# Patient Record
Sex: Female | Born: 1965 | Race: Black or African American | Hispanic: No | Marital: Married | Smoking: Never smoker
Health system: Southern US, Community
[De-identification: ages and names within clinical notes are randomized; demographics above are authoritative.]

## PROBLEM LIST (undated history)

## (undated) DIAGNOSIS — F329 Major depressive disorder, single episode, unspecified: Secondary | ICD-10-CM

## (undated) DIAGNOSIS — G4730 Sleep apnea, unspecified: Secondary | ICD-10-CM

## (undated) DIAGNOSIS — K219 Gastro-esophageal reflux disease without esophagitis: Secondary | ICD-10-CM

## (undated) DIAGNOSIS — J45909 Unspecified asthma, uncomplicated: Secondary | ICD-10-CM

## (undated) DIAGNOSIS — M797 Fibromyalgia: Secondary | ICD-10-CM

## (undated) DIAGNOSIS — F32A Depression, unspecified: Secondary | ICD-10-CM

## (undated) DIAGNOSIS — F419 Anxiety disorder, unspecified: Secondary | ICD-10-CM

## (undated) HISTORY — DX: Major depressive disorder, single episode, unspecified: F32.9

## (undated) HISTORY — DX: Anxiety disorder, unspecified: F41.9

## (undated) HISTORY — PX: TUBAL LIGATION: SHX77

## (undated) HISTORY — PX: OTHER SURGICAL HISTORY: SHX169

## (undated) HISTORY — DX: Depression, unspecified: F32.A

## (undated) HISTORY — DX: Gastro-esophageal reflux disease without esophagitis: K21.9

## (undated) HISTORY — DX: Fibromyalgia: M79.7

## (undated) HISTORY — PX: MOUTH SURGERY: SHX715

---

## 2001-03-11 ENCOUNTER — Emergency Department (HOSPITAL_COMMUNITY): Admission: EM | Admit: 2001-03-11 | Discharge: 2001-03-11 | Payer: Self-pay | Admitting: Emergency Medicine

## 2001-03-11 ENCOUNTER — Encounter: Payer: Self-pay | Admitting: Emergency Medicine

## 2001-03-13 ENCOUNTER — Emergency Department (HOSPITAL_COMMUNITY): Admission: EM | Admit: 2001-03-13 | Discharge: 2001-03-13 | Payer: Self-pay | Admitting: *Deleted

## 2001-04-02 ENCOUNTER — Emergency Department (HOSPITAL_COMMUNITY): Admission: EM | Admit: 2001-04-02 | Discharge: 2001-04-02 | Payer: Self-pay | Admitting: Emergency Medicine

## 2001-10-01 ENCOUNTER — Emergency Department (HOSPITAL_COMMUNITY): Admission: EM | Admit: 2001-10-01 | Discharge: 2001-10-01 | Payer: Self-pay | Admitting: Emergency Medicine

## 2001-10-01 ENCOUNTER — Encounter: Payer: Self-pay | Admitting: Emergency Medicine

## 2002-04-21 ENCOUNTER — Emergency Department (HOSPITAL_COMMUNITY): Admission: EM | Admit: 2002-04-21 | Discharge: 2002-04-21 | Payer: Self-pay | Admitting: Internal Medicine

## 2002-06-03 ENCOUNTER — Emergency Department (HOSPITAL_COMMUNITY): Admission: EM | Admit: 2002-06-03 | Discharge: 2002-06-03 | Payer: Self-pay | Admitting: Internal Medicine

## 2003-09-20 ENCOUNTER — Ambulatory Visit (HOSPITAL_COMMUNITY): Admission: RE | Admit: 2003-09-20 | Discharge: 2003-09-20 | Payer: Self-pay | Admitting: Oral Surgery

## 2003-09-20 ENCOUNTER — Ambulatory Visit (HOSPITAL_BASED_OUTPATIENT_CLINIC_OR_DEPARTMENT_OTHER): Admission: RE | Admit: 2003-09-20 | Discharge: 2003-09-20 | Payer: Self-pay | Admitting: Oral Surgery

## 2007-07-25 ENCOUNTER — Ambulatory Visit: Payer: Self-pay | Admitting: Cardiology

## 2008-05-17 ENCOUNTER — Emergency Department (HOSPITAL_COMMUNITY): Admission: EM | Admit: 2008-05-17 | Discharge: 2008-05-17 | Payer: Self-pay | Admitting: Emergency Medicine

## 2009-12-10 ENCOUNTER — Ambulatory Visit: Payer: Self-pay | Admitting: Cardiology

## 2010-08-19 LAB — CBC
HCT: 36.7 % (ref 36.0–46.0)
Hemoglobin: 12.4 g/dL (ref 12.0–15.0)
MCHC: 33.8 g/dL (ref 30.0–36.0)
MCV: 95.9 fL (ref 78.0–100.0)
Platelets: 169 10*3/uL (ref 150–400)
RBC: 3.82 MIL/uL — ABNORMAL LOW (ref 3.87–5.11)
RDW: 12.7 % (ref 11.5–15.5)
WBC: 6.1 10*3/uL (ref 4.0–10.5)

## 2010-08-19 LAB — URINALYSIS, ROUTINE W REFLEX MICROSCOPIC
Nitrite: NEGATIVE
Specific Gravity, Urine: >1.03 — ABNORMAL HIGH (ref 1.005–1.030)
Urobilinogen, UA: 0.2 mg/dL (ref 0.0–1.0)

## 2010-08-19 LAB — COMPREHENSIVE METABOLIC PANEL
ALT: 16 U/L (ref 0–35)
AST: 20 U/L (ref 0–37)
Albumin: 3.3 g/dL — ABNORMAL LOW (ref 3.5–5.2)
Alkaline Phosphatase: 56 U/L (ref 39–117)
BUN: 6 mg/dL (ref 6–23)
CO2: 29 mEq/L (ref 19–32)
Calcium: 9.3 mg/dL (ref 8.4–10.5)
Chloride: 104 mEq/L (ref 96–112)
Creatinine, Ser: 0.86 mg/dL (ref 0.4–1.2)
GFR calc Af Amer: >60 mL/min (ref >60)
GFR calc non Af Amer: >60 mL/min (ref >60)
Glucose, Bld: 122 mg/dL — ABNORMAL HIGH (ref 70–99)
Potassium: 4.3 mEq/L (ref 3.5–5.1)
Sodium: 139 mEq/L (ref 135–145)
Total Bilirubin: 0.4 mg/dL (ref 0.3–1.2)
Total Protein: 7.1 g/dL (ref 6.0–8.3)

## 2010-08-19 LAB — DIFFERENTIAL
Basophils Absolute: 0 10*3/uL (ref 0.0–0.1)
Lymphocytes Relative: 28 % (ref 12–46)
Neutro Abs: 4 10*3/uL (ref 1.7–7.7)
Neutrophils Relative %: 65 % (ref 43–77)

## 2010-08-19 LAB — PREGNANCY, URINE: Preg Test, Ur: NEGATIVE

## 2010-08-19 LAB — URINE MICROSCOPIC-ADD ON

## 2010-08-19 LAB — GLUCOSE, CAPILLARY: Glucose-Capillary: 113 mg/dL — ABNORMAL HIGH (ref 70–99)

## 2010-08-26 ENCOUNTER — Ambulatory Visit (HOSPITAL_COMMUNITY)
Admission: RE | Admit: 2010-08-26 | Discharge: 2010-08-26 | Disposition: A | Discharge status: Home / Self Care | Payer: BC Managed Care – PPO | Source: Ambulatory Visit | Attending: Family Medicine | Admitting: Family Medicine

## 2010-08-26 ENCOUNTER — Other Ambulatory Visit (HOSPITAL_COMMUNITY): Payer: Self-pay | Admitting: Family Medicine

## 2010-08-26 ENCOUNTER — Encounter (HOSPITAL_COMMUNITY): Payer: Self-pay

## 2010-08-26 DIAGNOSIS — M25519 Pain in unspecified shoulder: Secondary | ICD-10-CM | POA: Insufficient documentation

## 2010-08-26 DIAGNOSIS — IMO0002 Reserved for concepts with insufficient information to code with codable children: Secondary | ICD-10-CM

## 2010-09-20 NOTE — Op Note (Signed)
NAME:  JUMPER, Freda H                          ACCOUNT NO.:  [id number]   MEDICAL RECORD NO.:  [id number]                   PATIENT TYPE:  AMB   LOCATION:  DSC                                  FACILITY:  [facility]   PHYSICIAN:  Hewitt Blade, D.D.S.             DATE OF BIRTH:  [birth year]   DATE OF PROCEDURE:  09/20/2003  DATE OF DISCHARGE:                                 OPERATIVE REPORT   PREOPERATIVE DIAGNOSES:  1. Maxillary and mandibular nonrestorable teeth #1 through 32.  2. Bilateral maxillary hyperplastic tuberosities.   POSTOPERATIVE DIAGNOSES:  1. Maxillary and mandibular nonrestorable teeth #1 through 32.  2. Bilateral maxillary hyperplastic tuberosities.   SURGERY PERFORMED:  1. Reduction of right and left maxillary posterior tuberosities.  2. Removal of chronically infected and decayed teeth #1 through 32.   SURGEON:  Hewitt Blade, D.D.S.   FIRST ASSISTANT:  Earlene Plater.   ANESTHESIA:  General via oroendotracheal intubation.   ESTIMATED BLOOD LOSS:  Approximately 100 mL.   FLUIDS REPLACED:  Approximately 1500 mL crystalloid solution.   COMPLICATIONS:  None apparent.   INDICATION FOR PROCEDURE:  Ms. Virginia Trujillo is a 45 year old female who presented  to my office for removal of her chronically decayed and painful teeth.  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  She suffered from years of chronic dental neglect.  She presented  due to the chronic infection and irrigation, bilateral posterior  hyperplastic tuberosities which were in a difficult area to prevent removal  without airway obstruction.  For this reason the procedure was performed at  [facility] day surgery under general anesthesia to maintain the airway.   DETAILS OF THE PROCEDURE:  On Sep 20, 2003, Ms. Virginia Trujillo was taken to [facility] day surgical center, she was placed on the operating room table in  a supine position.  Following successful oroendotracheal  intubation and  general anesthesia, the patient's face, neck, and oral cavity were prepped  and draped in the usual sterile operating room fashion.  Attention was then  directed intraorally, where approximately 14 mL of 0.5% Xylocaine containing  1:200,000 epinephrine were infiltrated in the maxillary, buccal, and lingual  soft tissues and the right and left inferior alveolar nerve distributions  and the anterior buccal vestibular tissues.  A #9 Molt periosteal elevator  was then used to reflect a full-thickness mucoperiosteal flap around the  buccal and lingual surfaces of the dentition.  The teeth were then  subluxated from the alveolus using an 11A elevator and removed from the oral  cavity and the maxillary arch using a 150 dental forceps and in the  mandibular arch using a 151 dental forceps.  The bony margins were then  smoothed and contoured using the Stryker rotary osteotome with copious  irrigation.  Attention was then directed toward the tuberosities, where a  #15 Bard Parker blade was used to create a  full-thickness muco-osseous  incision through the hyperplastic tuberosities bilaterally.  The incision  was made approximately 1.5 cm along the buccal aspect of the tuberosities  and the palatal aspect of the tuberosities.   A #9 Molt periosteal elevator was then used to reflect the periosteum off of  the bony tuberosities. The soft tissue hyperplasia was then reduced using  the rongeurs and cutting forceps.  The bony tuberosities were then reduced  using a Stryker rotary osteotome and a #8 round bur with thorough and  copious irrigation of sterile saline solution.   The surgical sites were then thoroughly irrigated with sterile saline  irrigating solution and suctioned.  The mucoperiosteal margin was then  approximated and sutured in a continuous interlocking fashion using 4-0  chromic suture material.  The oral cavity was then again thoroughly  irrigated and suctioned using the  Yankauer suction.  The throat pack was  removed and the hypopharynx suctioned free of fluids and secretions.  The  patient was then allowed to awaken from the anesthesia and taken to the  recovery room, where she tolerated the procedure well without apparent  complication.                                               Hewitt Blade, D.D.S.    DC/MEDQ  D:  09/20/2003  T:  09/21/2003  Job:  [id number]

## 2010-10-01 ENCOUNTER — Ambulatory Visit: Payer: BC Managed Care – PPO | Admitting: Orthopedic Surgery

## 2010-10-03 ENCOUNTER — Other Ambulatory Visit (HOSPITAL_COMMUNITY): Payer: Self-pay | Admitting: Internal Medicine

## 2010-10-03 DIAGNOSIS — R609 Edema, unspecified: Secondary | ICD-10-CM

## 2010-10-04 ENCOUNTER — Ambulatory Visit (HOSPITAL_COMMUNITY)
Admission: RE | Admit: 2010-10-04 | Discharge: 2010-10-04 | Disposition: A | Discharge status: Home / Self Care | Payer: BC Managed Care – PPO | Source: Ambulatory Visit | Attending: Internal Medicine | Admitting: Internal Medicine

## 2010-10-04 DIAGNOSIS — R609 Edema, unspecified: Secondary | ICD-10-CM

## 2010-10-04 DIAGNOSIS — M79609 Pain in unspecified limb: Secondary | ICD-10-CM | POA: Insufficient documentation

## 2010-10-04 DIAGNOSIS — M7989 Other specified soft tissue disorders: Secondary | ICD-10-CM | POA: Insufficient documentation

## 2010-10-16 ENCOUNTER — Encounter: Payer: Self-pay | Admitting: Orthopedic Surgery

## 2010-10-16 ENCOUNTER — Ambulatory Visit (INDEPENDENT_AMBULATORY_CARE_PROVIDER_SITE_OTHER): Payer: BC Managed Care – PPO | Admitting: Orthopedic Surgery

## 2010-10-16 VITALS — Ht 64.0 in | Wt 239.0 lb

## 2010-10-16 DIAGNOSIS — M75101 Unspecified rotator cuff tear or rupture of right shoulder, not specified as traumatic: Secondary | ICD-10-CM | POA: Insufficient documentation

## 2010-10-16 DIAGNOSIS — M67919 Unspecified disorder of synovium and tendon, unspecified shoulder: Secondary | ICD-10-CM

## 2010-10-16 MED ORDER — HYDROCODONE-ACETAMINOPHEN 5-325 MG PO TABS: 1.0000 | ORAL_TABLET | Freq: Four times a day (QID) | ORAL | Status: AC | PRN

## 2010-10-16 MED ORDER — METHYLPREDNISOLONE ACETATE 40 MG/ML IJ SUSP: 40.0000 mg | Freq: Once | INTRAMUSCULAR | Status: DC

## 2010-10-16 NOTE — Progress Notes (Signed)
   This is a new patient  Referred from [facility]  Chief complaint pain LEFT arm for 6 months  The pain started suddenly without injury.  The patient is experiencing sharp dull throbbing stabbing burning pain over the LEFT shoulder.  The pain has an intensity of 8/10.  Symptoms are intermittent.  The patient has difficulty elevating the arm above shoulder height.  She reports some mild tingling and swelling in the LEFT arm.  Pain increases at night as well.  Positive review of systems include revision of fainting heart murmur shortness of breath heartburn muscle pain dizziness and seasonal ALLERGIES no weight loss chest pain frequency skin changes nervousness or anxiety excessive thirst or easy bleed bleeding or bruising  The history is recorded and reviewed as above  Vital signs are stable as recorded  General appearance is normal  The patient is alert and oriented x3  The patient's mood and affect are normal  The patient is ambulating with a normal gait pattern  The cardiovascular exam reveals normal pulses and temperature without edema swelling.  The lymphatic system is negative for palpable lymph nodes  The sensory exam is normal.  There are no pathologic reflexes.  Balance is normal.  Body area: RIGHT shoulder no swelling no tenderness full range of motion.  All ligaments are stable strength is normal skin is intact  LEFT shoulder inspection reveals tenderness around the peri-acromial region.  Range of motion flexion 130 external rotation 45 internal rotation back pocket.  Supraspinatus tendon grade 5 strength.  Internal/external rotation strength normal.  The shoulder is stable in abduction external rotation as well as inferiorly.  There is a positive impingement sign at 120.  Impression X-rays were done at the [facility] facility and they were normal included 2 views LEFT shoulder.  Rotator cuff syndrome most likely bursitis  Treatment Subacromial  injection, Norco 5 mg for pain.  Physical therapy.  No followup scheduled

## 2010-10-16 NOTE — Patient Instructions (Signed)
.  inj

## 2010-10-16 NOTE — Progress Notes (Signed)
LEFT shoulder subacromial space Injection LEFT knee.  Consent was obtained.  Time out was taken   LEFT Shoulderwas injected with Depo-Medrol 40 mg plus lidocaine 1% 4 cc.  Shoulder was prepped with alcohol and anesthetized with ethyl chloride.  The injection was tolerated without complication.   

## 2011-07-02 ENCOUNTER — Other Ambulatory Visit (HOSPITAL_COMMUNITY): Payer: Self-pay | Admitting: Internal Medicine

## 2011-07-02 ENCOUNTER — Ambulatory Visit (HOSPITAL_COMMUNITY)
Admission: RE | Admit: 2011-07-02 | Discharge: 2011-07-02 | Disposition: A | Discharge status: Home / Self Care | Payer: BC Managed Care – PPO | Source: Ambulatory Visit | Attending: Internal Medicine | Admitting: Internal Medicine

## 2011-07-02 DIAGNOSIS — M7989 Other specified soft tissue disorders: Secondary | ICD-10-CM | POA: Insufficient documentation

## 2011-07-02 DIAGNOSIS — M79609 Pain in unspecified limb: Secondary | ICD-10-CM | POA: Insufficient documentation

## 2012-10-19 ENCOUNTER — Other Ambulatory Visit: Payer: Self-pay

## 2012-10-19 DIAGNOSIS — G4730 Sleep apnea, unspecified: Secondary | ICD-10-CM

## 2012-10-26 ENCOUNTER — Ambulatory Visit: Payer: BC Managed Care – PPO | Attending: Neurology | Admitting: Sleep Medicine

## 2012-10-26 VITALS — Ht 63.0 in | Wt 253.0 lb

## 2012-10-26 DIAGNOSIS — G4733 Obstructive sleep apnea (adult) (pediatric): Secondary | ICD-10-CM | POA: Insufficient documentation

## 2012-10-26 DIAGNOSIS — G4730 Sleep apnea, unspecified: Secondary | ICD-10-CM

## 2012-10-29 NOTE — Procedures (Signed)
[facility] Rohn Fritsch A. Gerilyn Pilgrim, MD     [email or web address]        NAME:  Virginia Trujillo, Virginia Trujillo                ACCOUNT NO.:  [id number]  MEDICAL RECORD NO.:  [id number]          PATIENT TYPE:  OUT  LOCATION:  SLEEP LAB                     FACILITY:  [facility]  PHYSICIAN:  Adalina Dopson A. Gerilyn Pilgrim, M.D. DATE OF BIRTH:  1966-03-29  DATE OF STUDY:  10/26/2012                           NOCTURNAL POLYSOMNOGRAM  REFERRING PHYSICIAN:  Jabez Molner A. Gerilyn Pilgrim, M.D.  INDICATIONS:  A 47 year old, who presents with hypersomnia, fatigue, and snoring.  MEDICATIONS:  Omeprazole, tizanidine, meloxicam, diazepam, citalopram, and omeprazole.  EPWORTH SLEEPINESS SCALE:  15.  BMI:  25.  ARCHITECTURAL SUMMARY:  The total recording time is 365 minutes.  Sleep efficiency 88%, sleep latency 2 minutes.  REM latency calculated by the computer as 28 minutes.  The recalculated at 233 minutes.  Stage N1 5%, N2 70%, N3 30% and REM sleep 10%.  RESPIRATORY SUMMARY:  Baseline oxygen saturation is 98, lowest saturation 72 during REM sleep.  Diagnostic AHI is 19 and RDI 20.  LIMB MOVEMENT SUMMARY:  PLM index 0.  ELECTROCARDIOGRAM SUMMARY:  Average heart rate is 66 with no significant dysrhythmias observed.  IMPRESSION:  Moderate obstructive sleep apnea syndrome.  RECOMMENDATION:  Formal CPAP titration.    Jehiel Koepp A. Gerilyn Pilgrim, M.D.    KAD/MEDQ  D:  10/29/2012 10:11:16  T:  10/29/2012 10:25:45  Job:  [id number]

## 2012-12-06 ENCOUNTER — Ambulatory Visit (HOSPITAL_COMMUNITY)
Admission: RE | Admit: 2012-12-06 | Discharge: 2012-12-06 | Disposition: A | Discharge status: Home / Self Care | Payer: BC Managed Care – PPO | Source: Ambulatory Visit | Attending: Family Medicine | Admitting: Family Medicine

## 2012-12-06 ENCOUNTER — Other Ambulatory Visit (HOSPITAL_COMMUNITY): Payer: Self-pay | Admitting: Family Medicine

## 2012-12-06 DIAGNOSIS — R0602 Shortness of breath: Secondary | ICD-10-CM | POA: Insufficient documentation

## 2012-12-06 DIAGNOSIS — R079 Chest pain, unspecified: Secondary | ICD-10-CM | POA: Insufficient documentation

## 2012-12-06 DIAGNOSIS — R062 Wheezing: Secondary | ICD-10-CM | POA: Insufficient documentation

## 2012-12-16 ENCOUNTER — Other Ambulatory Visit: Payer: Self-pay

## 2012-12-16 DIAGNOSIS — G4730 Sleep apnea, unspecified: Secondary | ICD-10-CM

## 2012-12-16 DIAGNOSIS — R0683 Snoring: Secondary | ICD-10-CM

## 2012-12-21 ENCOUNTER — Other Ambulatory Visit: Payer: Self-pay | Admitting: *Deleted

## 2012-12-21 DIAGNOSIS — R002 Palpitations: Secondary | ICD-10-CM

## 2012-12-21 DIAGNOSIS — R079 Chest pain, unspecified: Secondary | ICD-10-CM

## 2012-12-21 DIAGNOSIS — R011 Cardiac murmur, unspecified: Secondary | ICD-10-CM

## 2012-12-22 ENCOUNTER — Encounter (HOSPITAL_COMMUNITY): Payer: Self-pay | Admitting: Cardiovascular Disease

## 2013-01-06 ENCOUNTER — Telehealth: Payer: Self-pay | Admitting: Internal Medicine

## 2013-01-06 ENCOUNTER — Ambulatory Visit (HOSPITAL_COMMUNITY)
Admission: RE | Admit: 2013-01-06 | Discharge: 2013-01-06 | Disposition: A | Discharge status: Home / Self Care | Payer: BC Managed Care – PPO | Source: Ambulatory Visit | Attending: Cardiovascular Disease | Admitting: Cardiovascular Disease

## 2013-01-06 DIAGNOSIS — R42 Dizziness and giddiness: Secondary | ICD-10-CM | POA: Insufficient documentation

## 2013-01-06 DIAGNOSIS — R011 Cardiac murmur, unspecified: Secondary | ICD-10-CM

## 2013-01-06 DIAGNOSIS — R002 Palpitations: Secondary | ICD-10-CM

## 2013-01-06 DIAGNOSIS — R079 Chest pain, unspecified: Secondary | ICD-10-CM | POA: Insufficient documentation

## 2013-01-06 DIAGNOSIS — I10 Essential (primary) hypertension: Secondary | ICD-10-CM | POA: Insufficient documentation

## 2013-01-06 DIAGNOSIS — E669 Obesity, unspecified: Secondary | ICD-10-CM | POA: Insufficient documentation

## 2013-01-06 DIAGNOSIS — R5381 Other malaise: Secondary | ICD-10-CM | POA: Insufficient documentation

## 2013-01-06 MED ORDER — TECHNETIUM TC 99M SESTAMIBI GENERIC - CARDIOLITE
10.0000 | Freq: Once | INTRAVENOUS | Status: AC | PRN
  Administered 2013-01-06: 10 via INTRAVENOUS

## 2013-01-06 MED ORDER — AMINOPHYLLINE 25 MG/ML IV SOLN
75.0000 mg | Freq: Once | INTRAVENOUS | Status: AC
  Administered 2013-01-06: 75 mg via INTRAVENOUS

## 2013-01-06 MED ORDER — TECHNETIUM TC 99M SESTAMIBI GENERIC - CARDIOLITE
30.0000 | Freq: Once | INTRAVENOUS | Status: AC | PRN
  Administered 2013-01-06: 30 via INTRAVENOUS

## 2013-01-06 MED ORDER — REGADENOSON 0.4 MG/5ML IV SOLN
0.4000 mg | Freq: Once | INTRAVENOUS | Status: AC
  Administered 2013-01-06: 0.4 mg via INTRAVENOUS

## 2013-01-06 NOTE — Telephone Encounter (Signed)
PT CALLED TO SCHEDULED NP APPT 09/25 @ 10:30 W/DR. CHISM REFERRING DR. Corliss Skains DX- ELEVATED ESR WELCOME PACKET MAILED.

## 2013-01-06 NOTE — Telephone Encounter (Signed)
LVOM FOR PT TO RETURN CALL IN RE TO REFERRAL.  °

## 2013-01-06 NOTE — Progress Notes (Signed)
2D Echo Performed 01/06/2013    Clearence Ped, RCS

## 2013-01-06 NOTE — Telephone Encounter (Signed)
C/D 01/06/13 for appt. 01/27/13

## 2013-01-06 NOTE — Procedures (Addendum)
[facility] [address] [place] [address] [phone]  Cardiology Nuclear Med Study  Virginia Trujillo is a 47 y.o. female     MRN : [id number]     DOB: March 17, 1966  Procedure Date: 01/06/2013  Nuclear Med Background Indication for Stress Test:  Evaluation for Ischemia History:  MURMUR Cardiac Risk Factors: Family History - CAD, Hypertension, Lipids and Obesity  Symptoms:  Chest Pain, Dizziness, DOE, Fatigue, Light-Headedness, Palpitations and SOB   Nuclear Pre-Procedure Caffeine/Decaff Intake:  10:00pm NPO After: 8:00am   IV Site: R Forearm  IV 0.9% NS with Angio Cath:  22g  Chest Size (in):  N/A  IV Started by: Emmit Pomfret, RN  Height: 5\' 2"  (1.575 m)  Cup Size: DD  BMI:  Body mass index is 46.26 kg/(m^2). Weight:  253 lb (114.76 kg)   Tech Comments:  N/A    Nuclear Med Study 1 or 2 day study: 1 day  Stress Test Type:  Lexiscan  Order Authorizing Provider:  Hazeline Junker   Resting Radionuclide: Technetium 97m Sestamibi  Resting Radionuclide Dose: 10.5 mCi   Stress Radionuclide:  Technetium 25m Sestamibi  Stress Radionuclide Dose: 31.6 mCi           Stress Protocol Rest HR: 71 Stress HR: 99  Rest BP: 130/84 Stress BP:139/81  Exercise Time (min): n/a METS: n/a          Dose of Adenosine (mg):  n/a Dose of Lexiscan: 0.4 mg  Dose of Atropine (mg): n/a Dose of Dobutamine: n/a mcg/kg/min (at max HR)  Stress Test Technologist: Ernestene Mention, CCT Nuclear Technologist: Koren Shiver, CNMT   Rest Procedure:  Myocardial perfusion imaging was performed at rest 45 minutes following the intravenous administration of Technetium 42m Sestamibi. Stress Procedure:  The patient received IV Lexiscan 0.4 mg over 15-seconds.  Technetium 76m Sestamibi injected at 30-seconds.  Due to patient's shortness of breath and light-headedness, she was given IV Aminophylline 75 mg. Symptoms were resolved during recovery. There were  no significant changes with Lexiscan.  Quantitative spect images were obtained after a 45 minute delay.  Transient Ischemic Dilatation (Normal <1.22):  0.80 Lung/Heart Ratio (Normal <0.45):  0.26 QGS EDV:  55 ml QGS ESV:  13 ml LV Ejection Fraction: 77%  Rest ECG: NSR - Normal EKG  Stress ECG: No significant change from baseline ECG  QPS Raw Data Images:  Normal; no motion artifact; normal heart/lung ratio. Stress Images:  Normal homogeneous uptake in all areas of the myocardium. Rest Images:  Normal homogeneous uptake in all areas of the myocardium. Subtraction (SDS):  No evidence of ischemia.  Impression Exercise Capacity:  Lexiscan with no exercise. BP Response:  Normal blood pressure response. Clinical Symptoms:  No significant symptoms noted. ECG Impression:  No significant ECG changes with Lexiscan. Comparison with Prior Nuclear Study: No previous nuclear study performed  Overall Impression:  Normal stress nuclear study.  LV Wall Motion:  NL LV Function; NL Wall Motion; EF 78%.  Virginia Nose, MD, [facility] Board Certified in Nuclear Cardiology Attending Cardiologist [facility]  Virginia Nose, MD  01/06/2013 1:11 PM

## 2013-01-07 ENCOUNTER — Telehealth: Payer: Self-pay | Admitting: Internal Medicine

## 2013-01-07 NOTE — Telephone Encounter (Signed)
C/D 01/07/13 for appt. 01/27/13

## 2013-01-11 ENCOUNTER — Encounter: Payer: Self-pay | Admitting: Cardiovascular Disease

## 2013-01-13 ENCOUNTER — Ambulatory Visit: Payer: BC Managed Care – PPO | Attending: Neurology | Admitting: Sleep Medicine

## 2013-01-13 DIAGNOSIS — Z6841 Body Mass Index (BMI) 40.0 and over, adult: Secondary | ICD-10-CM | POA: Insufficient documentation

## 2013-01-13 DIAGNOSIS — G4733 Obstructive sleep apnea (adult) (pediatric): Secondary | ICD-10-CM | POA: Insufficient documentation

## 2013-01-13 DIAGNOSIS — R0683 Snoring: Secondary | ICD-10-CM

## 2013-01-13 DIAGNOSIS — G4730 Sleep apnea, unspecified: Secondary | ICD-10-CM

## 2013-01-15 NOTE — Procedures (Signed)
[facility] Berneice Zettlemoyer A. Gerilyn Pilgrim, MD     [email or web address]        NAME:  Virginia Trujillo, Virginia Trujillo                ACCOUNT NO.:  [id number]  MEDICAL RECORD NO.:  [id number]          PATIENT TYPE:  OUT  LOCATION:  SLEEP LAB                     FACILITY:  [facility]  PHYSICIAN:  Lochlin Eppinger A. Gerilyn Pilgrim, M.D. DATE OF BIRTH:  1965-11-27  DATE OF STUDY:  01/13/2013                           NOCTURNAL POLYSOMNOGRAM  REFERRING PHYSICIAN:  Lekia Nier A. Gerilyn Pilgrim, M.D.  INDICATION:  This is a 47 year old woman who presents with hypersomnia and snoring.  She has polysomnography, evidence of sleep apnea.  This is a CPAP titration recording.  MEDICATIONS:  Omeprazole, Zanaflex, meloxicam, diazepam, Lexapro, trazodone, gabapentin.  EPWORTH SLEEPINESS SCALE:  15.  BMI:  45.  ARCHITECTURAL SUMMARY:  The total recording time is 360 minutes, sleep efficiency is 75%.  Sleep latency 5 minutes.  REM latency 221 minute, stage N1 10%, N2 72%, N3 9%, and REM sleep 9%.  RESPIRATORY SUMMARY:  Baseline oxygen saturation is 98, lowest saturation 92 during non-REM sleep.  The patient has been treated with positive pressure starting at 40, increased 14.  The patient did well at pressures of 13 and 14 with resolution of obstructive events.  LIMB MOVEMENT SUMMARY:  PLM index 0.  ELECTROCARDIOGRAM SUMMARY:  Average heart rate is 58 with sinus arrhythmia observed.   IMPRESSION:  Obstructive sleep apnea syndrome, which responds well to a CPAP of 13 and 14. We recommended 13 the lowest effective pressure.  Thanks for this referral.    Njeri Vicente A. Gerilyn Pilgrim, M.D.    KAD/MEDQ  D:  01/15/2013 14:30:39  T:  01/15/2013 14:52:56  Job:  [id number]

## 2013-01-27 ENCOUNTER — Ambulatory Visit: Payer: BC Managed Care – PPO

## 2013-01-27 ENCOUNTER — Other Ambulatory Visit: Payer: Self-pay | Admitting: Internal Medicine

## 2013-01-27 DIAGNOSIS — G4733 Obstructive sleep apnea (adult) (pediatric): Secondary | ICD-10-CM

## 2013-10-11 ENCOUNTER — Other Ambulatory Visit: Payer: Self-pay | Admitting: *Deleted

## 2013-10-11 MED ORDER — OMEPRAZOLE 40 MG PO CPDR: 40.0000 mg | DELAYED_RELEASE_CAPSULE | Freq: Every day | ORAL | Status: DC

## 2013-10-11 NOTE — Telephone Encounter (Signed)
Rx refill sent to patient pharmacy   

## 2013-11-28 ENCOUNTER — Encounter: Payer: Self-pay | Admitting: Neurology

## 2013-11-29 ENCOUNTER — Encounter: Payer: Self-pay | Admitting: Neurology

## 2013-11-29 ENCOUNTER — Ambulatory Visit (INDEPENDENT_AMBULATORY_CARE_PROVIDER_SITE_OTHER): Payer: BC Managed Care – PPO | Admitting: Neurology

## 2013-11-29 VITALS — BP 140/74 | HR 78 | Ht 64.5 in | Wt 256.0 lb

## 2013-11-29 DIAGNOSIS — R51 Headache: Secondary | ICD-10-CM

## 2013-11-29 DIAGNOSIS — R4189 Other symptoms and signs involving cognitive functions and awareness: Secondary | ICD-10-CM

## 2013-11-29 DIAGNOSIS — F09 Unspecified mental disorder due to known physiological condition: Secondary | ICD-10-CM

## 2013-11-29 DIAGNOSIS — R519 Headache, unspecified: Secondary | ICD-10-CM | POA: Insufficient documentation

## 2013-11-29 NOTE — Progress Notes (Signed)
[facility]    Provider:  Dr Hosie PoissonSumner Referring Provider: Corrie MckusickGolding, John C, MD Primary Care Physician:  Colette RibasGOLDING, JOHN CABOT, MD  CC:  Headaches and cognitive decline  HPI:  Virginia Trujillo is a 48 y.o. female here as a referral from Dr. Phillips OdorGolding for headache and cognitive decline  Headaches started 1-2 months ago. Occuring 1-2 times a day, last a few minutes. Comes on quickly and then resolves quickly. Described as frontal and bitemporal, squeezing type pain. Gets up to a 8/10 in severity. No nausea or emesis. Notes some blurry vision, no loss of vision, no transient obscurations. Gets generalized weakness. Occurs randomly throughout the day. No known triggers. Notes cutting back on her caffeine intake, started cutting back 1-2 weeks ago. Currently taking tylenol for the headache, typically takes 2-4 tylenol 325mg  for the headache. Notes some sinus congestion and allergies but currently doing well. Wears a CPAP machine for her sleep. Started this around 4-6 months ago, continues to not sleep well. Wakes up feeling tired. Wears the mask.   Memory decline started few months ago. Has trouble with short term memory. Forgets why she went into a store, forgets what she was talking about, is missing. Notes some difficulty with remote memory. No head trauma. No EtOH or tobacco usage. Notes she has fibromyalgia and arthritis. Grandfather had AD.   Review of Systems: Out of a complete 14 system review, the patient complains of only the following symptoms, and all other reviewed systems are negative. + headache, fatigue, blurry vision  History   Social History  . Marital Status: Married    Spouse Name: N/A    Number of Children: 2  . Years of Education: 12   Occupational History  . none   . Unemployed     Social History Main Topics  . Smoking status: Never Smoker   . Smokeless tobacco: Never Used  . Alcohol Use: No  . Drug Use: No  . Sexual Activity: Not on file   Other  Topics Concern  . Not on file   Social History Narrative   Patient lives at home with husband and son.    Patient does not work   Patient has a high school education    Patient has 2 children.    Family History  Problem Relation Age of Onset  . Heart disease    . Arthritis    . Cancer    . Asthma    . Diabetes    . Kidney disease      Past Medical History  Diagnosis Date  . Acid reflux     Past Surgical History  Procedure Laterality Date  . Cesarean section    . Tubes tied    . Mouth surgery      Current Outpatient Prescriptions  Medication Sig Dispense Refill  . Acetaminophen (TYLENOL 8 HOUR PO) Take by mouth.        Marland Kitchen. albuterol (PROVENTIL) (2.5 MG/3ML) 0.083% nebulizer solution Take 2.5 mg by nebulization every 6 (six) hours as needed for wheezing or shortness of breath.      Marland Kitchen. buPROPion (WELLBUTRIN XL) 150 MG 24 hr tablet Take 150 mg by mouth daily.      . Cholecalciferol (VITAMIN D PO) Take by mouth.      . escitalopram (LEXAPRO) 20 MG tablet Take 30 mg by mouth daily.      Marland Kitchen. gabapentin (NEURONTIN) 300 MG capsule Take 300 mg by mouth 3 (three) times daily.      .Marland Kitchen  ketoconazole (NIZORAL) 200 MG tablet Take 200 mg by mouth daily.      . meloxicam (MOBIC) 15 MG tablet Take 15 mg by mouth daily.        Marland Kitchen omeprazole (PRILOSEC) 40 MG capsule Take 1 capsule (40 mg total) by mouth daily. Appointment needed for further refills  30 capsule  3  . phentermine 37.5 MG capsule Take 37.5 mg by mouth every morning.      . selenium sulfide (SELSUN) 2.5 % shampoo Apply 1 application topically daily as needed for irritation.      . traZODone (DESYREL) 50 MG tablet Take 50 mg by mouth at bedtime.         Current Facility-Administered Medications  Medication Dose Route Frequency Provider Last Rate Last Dose  . methylPREDNISolone acetate (DEPO-MEDROL) injection 40 mg  40 mg Intra-articular Once Vickki Hearing, MD        Allergies as of 11/29/2013 - Review Complete 11/29/2013    Allergen Reaction Noted  . Penicillins  08/26/2010    Vitals: BP 140/74  Pulse 78  Ht 5' 4.5" (1.638 m)  Wt 256 lb (116.121 kg)  BMI 43.28 kg/m2 Last Weight:  Wt Readings from Last 1 Encounters:  11/29/13 256 lb (116.121 kg)   Last Height:   Ht Readings from Last 1 Encounters:  11/29/13 5' 4.5" (1.638 m)     Physical exam: Exam: Gen: NAD, conversant, obese Eyes: anicteric sclerae, moist conjunctivae HENT: Atraumatic, oropharynx clear Neck: Trachea midline; supple,  Lungs: CTA, no wheezing, rales, rhonic                          CV: RRR, no MRG Abdomen: Soft, non-tender;  Extremities: No peripheral edema  Skin: Normal temperature, no rash,  Psych: Appropriate affect, pleasant  Neuro: MS: AA&Ox3, appropriately interactive, normal affect   Speech: fluent w/o paraphasic error  Memory: good recent and remote recall  CN: PERRL, unable to fully visualize optic discs due to pupil size, VFF to FC bilat,  EOMI no nystagmus, no ptosis, sensation intact to LT V1-V3 bilat, face symmetric, no weakness, hearing grossly intact, palate elevates symmetrically, shoulder shrug 5/5 bilat,  tongue protrudes midline, no fasiculations noted.  Motor: normal bulk and tone Strength: 5/5  In all extremities  Coord: rapid alternating and point-to-point (FNF, HTS) movements intact.  Reflexes: symmetrical, bilat downgoing toes  Sens: LT intact in all extremities  Gait: posture, stance, stride and arm-swing normal. Tandem gait intact. Able to walk on heels and toes. Romberg absent.   Assessment:  After physical and neurologic examination, review of laboratory studies, imaging, neurophysiology testing and pre-existing records, assessment will be reviewed on the problem list.  Plan:  Treatment plan and additional workup will be reviewed under Problem List.  1)headache 2)fatigue 3)cognitive decline 4)OSA  47y/o woman presenting for initial evaluation of headache, fatigue and  cognitive decline. She is wearing CPAP but does not notice any benefit from it. Suspect that her symptoms are related to poorly controlled OSA. Counseled her to follow up with sleep doctor to see if she needs titration or may benefit from alternative mask style. Patient notes some worsening of her vision. Eye strain may be contributing to her headaches. With headache and obesity would need to consider IIH, unable to visualize optic discs due to pupil size. Counseled patient to follow up with eye doctor for formal evaluation. Will check B12, TSH, MMA for cognitive decline. Follow up once workup  completed.   Elspeth Cho, DO  [facility] [address] [place], [place] [address]  Phone [phone] Fax [phone]

## 2013-11-29 NOTE — Patient Instructions (Signed)
Overall you are doing fairly well but I do want to suggest a few things today:   Remember to drink plenty of fluid, eat healthy meals and do not skip any meals. Try to eat protein with a every meal and eat a healthy snack such as fruit or nuts in between meals. Try to keep a regular sleep-wake schedule and try to exercise daily, particularly in the form of walking, 20-30 minutes a day, if you can.   As far as diagnostic testing:  1)Please have some blood work drawn today  Please follow up with your sleep doctor to see if any adjustments need to be made in your machine or mask  Please follow up with an eye doctor for a formal eye exam  Please follow up in 4 months. Please call us with any interim questions, concerns, problems, updates or refill requests.   My clinical assistant and will answer any of your questions and relay your messages to me and also relay most of my messages to you.   Our phone number is [phone]. We also have an after hours call service for urgent matters and there is a physician on-call for urgent questions. For any emergencies you know to call 911 or go to the nearest emergency room

## 2013-11-30 NOTE — Progress Notes (Signed)
Quick Note:  I called and spoke to pt. Relayed the results of low B 12. She would like to come in here for injections. Instructed that Dr. Hosie PoissonSumner likes pts to receive 1000mcg IM daily for 5 days, then once weekly for 4 wks, then once monthly. She will start tomorrow at 0900. Will place on schedule. ______

## 2013-12-01 ENCOUNTER — Ambulatory Visit (INDEPENDENT_AMBULATORY_CARE_PROVIDER_SITE_OTHER): Payer: BC Managed Care – PPO | Admitting: *Deleted

## 2013-12-01 DIAGNOSIS — E538 Deficiency of other specified B group vitamins: Secondary | ICD-10-CM

## 2013-12-01 MED ORDER — CYANOCOBALAMIN 1000 MCG/ML IJ SOLN
1000.0000 ug | INTRAMUSCULAR | Status: DC
  Administered 2013-12-01 – 2013-12-02 (×2): 1000 ug via INTRAMUSCULAR

## 2013-12-01 NOTE — Patient Instructions (Signed)
Patient will return for next injection on 12/02/13. Day 2/5

## 2013-12-01 NOTE — Progress Notes (Signed)
Patient here for B12 injection, accompanied her to room 7, under aseptic technique B 12 Injection 1000 mcg IM, given to Left deltoid patient tolerated well, band aid applied.

## 2013-12-02 ENCOUNTER — Ambulatory Visit (INDEPENDENT_AMBULATORY_CARE_PROVIDER_SITE_OTHER): Payer: BC Managed Care – PPO | Admitting: *Deleted

## 2013-12-02 DIAGNOSIS — E538 Deficiency of other specified B group vitamins: Secondary | ICD-10-CM

## 2013-12-02 LAB — TSH: TSH: 3.19 u[IU]/mL (ref 0.450–4.500)

## 2013-12-02 LAB — METHYLMALONIC ACID, SERUM: Methylmalonic Acid: 261 nmol/L (ref 0–378)

## 2013-12-02 LAB — VITAMIN B12: Vitamin B-12: 184 pg/mL — ABNORMAL LOW (ref 211–946)

## 2013-12-02 MED ORDER — CYANOCOBALAMIN 1000 MCG/ML IJ SOLN
1000.0000 ug | Freq: Once | INTRAMUSCULAR | Status: AC
  Administered 2013-12-05 – 2013-12-07 (×3): 1000 ug via INTRAMUSCULAR

## 2013-12-02 NOTE — Addendum Note (Signed)
Addended by: Dan MakerWRIGHT, Ahmon Tosi L on: 12/02/2013 09:16 AM   Modules accepted: Orders

## 2013-12-02 NOTE — Patient Instructions (Signed)
Patient will return on 12/05/13 for Day 3/5.

## 2013-12-02 NOTE — Progress Notes (Signed)
Patient here for B 12 injections, injected 1000 mcg IM, band-aid applied, patient tolerated well. Per Dr. Minus BreedingSumner's protocol patient will receive 5 days of daily 1000 mcg IM, then once a week for 4 weeks, then once a month.

## 2013-12-05 ENCOUNTER — Ambulatory Visit (INDEPENDENT_AMBULATORY_CARE_PROVIDER_SITE_OTHER): Payer: BC Managed Care – PPO | Admitting: *Deleted

## 2013-12-05 DIAGNOSIS — E538 Deficiency of other specified B group vitamins: Secondary | ICD-10-CM

## 2013-12-05 NOTE — Patient Instructions (Signed)
Patient will come back tomorrow for Day 4/5.

## 2013-12-05 NOTE — Progress Notes (Signed)
Patient here for B12 injection, patient tolerated well, band-aid applied.

## 2013-12-06 ENCOUNTER — Ambulatory Visit (INDEPENDENT_AMBULATORY_CARE_PROVIDER_SITE_OTHER): Payer: BC Managed Care – PPO | Admitting: *Deleted

## 2013-12-06 DIAGNOSIS — E538 Deficiency of other specified B group vitamins: Secondary | ICD-10-CM

## 2013-12-06 NOTE — Progress Notes (Signed)
Patient here for Day 4/5 B12 injection, under aseptic techniques, Cyanocobalamin 1000 mcg injected into Right deltoid, band-aid applied, patient tolerated well.

## 2013-12-06 NOTE — Patient Instructions (Signed)
Will return on 12/07/13 for Day 5/5, and will return again on 12/14/13.

## 2013-12-07 ENCOUNTER — Ambulatory Visit (INDEPENDENT_AMBULATORY_CARE_PROVIDER_SITE_OTHER): Payer: BC Managed Care – PPO | Admitting: *Deleted

## 2013-12-07 DIAGNOSIS — E538 Deficiency of other specified B group vitamins: Secondary | ICD-10-CM

## 2013-12-07 NOTE — Progress Notes (Signed)
Patient here for B 12 injection, under aseptic technique, Cyanocobalamin 1000 mcg IM injected into the Left deltoid, band-aid applied, patient tolerated well.

## 2013-12-07 NOTE — Patient Instructions (Signed)
Patient will return on 12/14/13 for her next injection.

## 2013-12-14 ENCOUNTER — Ambulatory Visit (INDEPENDENT_AMBULATORY_CARE_PROVIDER_SITE_OTHER): Payer: BC Managed Care – PPO | Admitting: *Deleted

## 2013-12-14 DIAGNOSIS — E538 Deficiency of other specified B group vitamins: Secondary | ICD-10-CM

## 2013-12-14 MED ORDER — CYANOCOBALAMIN 1000 MCG/ML IJ SOLN
1000.0000 ug | INTRAMUSCULAR | Status: DC
  Administered 2013-12-14 – 2013-12-28 (×3): 1000 ug via INTRAMUSCULAR

## 2013-12-14 NOTE — Progress Notes (Signed)
Patient here for B 12 injection, under aseptic technique, B 12 1000 mcg given IM  Right deltoid, patient tolerated well band aid applied.

## 2013-12-14 NOTE — Patient Instructions (Signed)
Patient will return on 12/21/13.

## 2013-12-21 ENCOUNTER — Ambulatory Visit: Payer: BC Managed Care – PPO

## 2013-12-21 ENCOUNTER — Telehealth: Payer: Self-pay | Admitting: *Deleted

## 2013-12-21 NOTE — Telephone Encounter (Signed)
Pt ill, cancelled appt for todays B 12.

## 2013-12-23 ENCOUNTER — Ambulatory Visit (INDEPENDENT_AMBULATORY_CARE_PROVIDER_SITE_OTHER): Payer: BC Managed Care – PPO | Admitting: *Deleted

## 2013-12-23 DIAGNOSIS — E538 Deficiency of other specified B group vitamins: Secondary | ICD-10-CM

## 2013-12-23 NOTE — Progress Notes (Signed)
Patient here for B 12 injection, cyanocobalamin 1000 mcg/ mL injected into the Left deltoid, band aid applied, patient tolerated well.  

## 2013-12-23 NOTE — Patient Instructions (Signed)
Patient will return in 1 week for next injection.

## 2013-12-28 ENCOUNTER — Ambulatory Visit (INDEPENDENT_AMBULATORY_CARE_PROVIDER_SITE_OTHER): Payer: BC Managed Care – PPO | Admitting: *Deleted

## 2013-12-28 DIAGNOSIS — E538 Deficiency of other specified B group vitamins: Secondary | ICD-10-CM

## 2013-12-28 NOTE — Progress Notes (Signed)
Patient here for B 12 injection, cyanocobalamin 1000 mcg/ mL injected into the Right deltoid, band aid applied, patient tolerated well.

## 2013-12-28 NOTE — Patient Instructions (Signed)
Return in 1 month for next injection the week of 01/23/14

## 2013-12-29 ENCOUNTER — Ambulatory Visit (HOSPITAL_COMMUNITY)
Admission: RE | Admit: 2013-12-29 | Discharge: 2013-12-29 | Disposition: A | Discharge status: Home / Self Care | Payer: BC Managed Care – PPO | Source: Ambulatory Visit | Attending: Family Medicine | Admitting: Family Medicine

## 2013-12-29 ENCOUNTER — Other Ambulatory Visit (HOSPITAL_COMMUNITY): Payer: Self-pay | Admitting: Family Medicine

## 2013-12-29 DIAGNOSIS — M7730 Calcaneal spur, unspecified foot: Secondary | ICD-10-CM | POA: Insufficient documentation

## 2013-12-29 DIAGNOSIS — M25476 Effusion, unspecified foot: Secondary | ICD-10-CM | POA: Diagnosis present

## 2013-12-29 DIAGNOSIS — M25473 Effusion, unspecified ankle: Secondary | ICD-10-CM | POA: Diagnosis present

## 2013-12-29 DIAGNOSIS — M25571 Pain in right ankle and joints of right foot: Secondary | ICD-10-CM

## 2013-12-29 DIAGNOSIS — M25579 Pain in unspecified ankle and joints of unspecified foot: Secondary | ICD-10-CM | POA: Insufficient documentation

## 2014-01-25 ENCOUNTER — Ambulatory Visit: Payer: BC Managed Care – PPO

## 2014-03-13 ENCOUNTER — Ambulatory Visit: Payer: BC Managed Care – PPO | Admitting: Medical

## 2014-03-21 ENCOUNTER — Ambulatory Visit: Payer: BC Managed Care – PPO | Admitting: Medical

## 2014-04-20 ENCOUNTER — Ambulatory Visit: Payer: BC Managed Care – PPO | Admitting: Neurology

## 2014-04-21 ENCOUNTER — Other Ambulatory Visit (HOSPITAL_COMMUNITY): Payer: Self-pay | Admitting: Physician Assistant

## 2014-04-21 DIAGNOSIS — M5410 Radiculopathy, site unspecified: Secondary | ICD-10-CM

## 2014-05-02 ENCOUNTER — Ambulatory Visit (HOSPITAL_COMMUNITY)
Admission: RE | Admit: 2014-05-02 | Discharge: 2014-05-02 | Disposition: A | Discharge status: Home / Self Care | Payer: BC Managed Care – PPO | Source: Ambulatory Visit | Attending: Physician Assistant | Admitting: Physician Assistant

## 2014-05-02 DIAGNOSIS — M5410 Radiculopathy, site unspecified: Secondary | ICD-10-CM

## 2014-05-17 ENCOUNTER — Ambulatory Visit: Payer: BC Managed Care – PPO | Admitting: Neurology

## 2014-09-11 ENCOUNTER — Other Ambulatory Visit: Payer: Self-pay | Admitting: Orthopaedic Surgery

## 2014-09-11 DIAGNOSIS — M5030 Other cervical disc degeneration, unspecified cervical region: Secondary | ICD-10-CM

## 2014-09-11 DIAGNOSIS — M5136 Other intervertebral disc degeneration, lumbar region: Secondary | ICD-10-CM

## 2014-09-28 ENCOUNTER — Other Ambulatory Visit: Payer: Self-pay

## 2014-09-29 ENCOUNTER — Ambulatory Visit
Admission: RE | Admit: 2014-09-29 | Discharge: 2014-09-29 | Disposition: A | Discharge status: Home / Self Care | Payer: BLUE CROSS/BLUE SHIELD | Source: Ambulatory Visit | Attending: Orthopaedic Surgery | Admitting: Orthopaedic Surgery

## 2014-09-29 DIAGNOSIS — M51369 Other intervertebral disc degeneration, lumbar region without mention of lumbar back pain or lower extremity pain: Secondary | ICD-10-CM

## 2014-09-29 DIAGNOSIS — M5030 Other cervical disc degeneration, unspecified cervical region: Secondary | ICD-10-CM

## 2014-09-29 DIAGNOSIS — M5136 Other intervertebral disc degeneration, lumbar region: Secondary | ICD-10-CM

## 2015-07-17 ENCOUNTER — Telehealth (HOSPITAL_COMMUNITY): Payer: Self-pay | Admitting: *Deleted

## 2015-07-23 NOTE — Progress Notes (Signed)
Patient ID: Virginia Trujillo, female   DOB: [birth year], 50 y.o.   MRN: [id number]     Cardiology Office Note   Date:  07/25/2015   ID:  Virginia Trujillo, DOB August 12, 1965, MRN [id number]  PCP:  Colette Ribas, MD  Cardiologist:   Charlton Haws, MD   No chief complaint on file.     History of Present Illness: Virginia Trujillo is a 50 y.o. female who presents for chest pain Previously seen by [facility].  Reviewed myovue done 2014 which was normal Echo done same time for chest pain and dypsnea Showed no valve disease, normal EF and normal PA pressures Seen in ER January with asthma exacerbation Rx with prednisone and antibiotics ? Also with costochondritis  Seems to have Chronic anxiety / depression as well .  She has had tightness in chest once since d/c and it was also related to asthma.  She does not smoke No cough has been on CPAP for 4 years and is Compliant.  Has chronic back pain getting injections in [place] but this limits her exercise Father just had big MI Rx at [facility] 3 weeks ago     Past Medical History  Diagnosis Date  . Acid reflux   . Fibromyalgia     Past Surgical History  Procedure Laterality Date  . Cesarean section    . Tubes tied    . Mouth surgery       Current Outpatient Prescriptions  Medication Sig Dispense Refill  . Acetaminophen (TYLENOL 8 HOUR PO) Take by mouth.      Marland Kitchen albuterol (PROVENTIL) (2.5 MG/3ML) 0.083% nebulizer solution Take 2.5 mg by nebulization every 6 (six) hours as needed for wheezing or shortness of breath.    . Cholecalciferol (VITAMIN D PO) Take by mouth.    . DULoxetine (CYMBALTA) 60 MG capsule Take 60 mg by mouth daily.    Marland Kitchen escitalopram (LEXAPRO) 20 MG tablet Take 30 mg by mouth daily.    Marland Kitchen gabapentin (NEURONTIN) 600 MG tablet daily  1  . ketoconazole (NIZORAL) 200 MG tablet Take 200 mg by mouth daily.    . meloxicam (MOBIC) 15 MG tablet Take 15 mg by mouth daily.      Marland Kitchen omeprazole (PRILOSEC) 40 MG capsule Take 1 capsule (40 mg total)  by mouth daily. Appointment needed for further refills 30 capsule 3  . phentermine 37.5 MG capsule Take 37.5 mg by mouth every morning.    . selenium sulfide (SELSUN) 2.5 % shampoo Apply 1 application topically daily as needed for irritation.    . traZODone (DESYREL) 50 MG tablet Take 50 mg by mouth at bedtime.       Current Facility-Administered Medications  Medication Dose Route Frequency Provider Last Rate Last Dose  . methylPREDNISolone acetate (DEPO-MEDROL) injection 40 mg  40 mg Intra-articular Once Vickki Hearing, MD        Allergies:   Prednisone and Penicillins    Social History:  The patient  reports that she has never smoked. She has never used smokeless tobacco. She reports that she does not drink alcohol or use illicit drugs.   Family History:  The patient's family history includes Congestive Heart Failure in her sister; Diabetes in her brother and sister; Hypertension in her brother, father, and sister; Stroke in her maternal grandmother and paternal grandfather.    ROS:  Please see the history of present illness.   Otherwise, review of systems are positive for none.   All other  systems are reviewed and negative.    PHYSICAL EXAM: VS:  BP 124/78 mmHg  Pulse 76  Ht 5\' 3"  (1.6 m)  Wt 120.203 kg (265 lb)  BMI 46.95 kg/m2  SpO2 96% , BMI Body mass index is 46.95 kg/(m^2). Affect appropriate Healthy:  appears stated age HEENT: normal Neck supple with no adenopathy JVP normal no bruits no thyromegaly Lungs clear with no wheezing and good diaphragmatic motion Heart:  S1/S2 no murmur, no rub, gallop or click PMI normal Abdomen: benighn, BS positve, no tenderness, no AAA no bruit.  No HSM or HJR Distal pulses intact with no bruits No edema Neuro non-focal Skin warm and dry No muscular weakness    EKG:  01/25/13  SR PR 216  Poor R wave progression nonspecific ST changes   07/25/15  SR rate 69 low voltage due to body habitus  Poor R wave progression    Recent  Labs: No results found for requested labs within last 365 days.    Lipid Panel No results found for: CHOL, TRIG, HDL, CHOLHDL, VLDL, LDLCALC, LDLDIRECT    Wt Readings from Last 3 Encounters:  07/25/15 120.203 kg (265 lb)  11/29/13 116.121 kg (256 lb)  01/13/13 114.76 kg (253 lb)      Other studies Reviewed: Additional studies/ records that were reviewed today include: Primary notes see HPI   Old records and echo / nuclear 2014 .    ASSESSMENT AND PLAN:  1.  Chest pain :  Atypical risk factors, abnormal ECG f/u lexiscan myovue cannot walk on treadmill due to back 2. Dyspnea/Asthma:  Continue rescue inhaler consider f/u Hawkins for symbicort \ Echo to assess RV/LV function  3. OSA  Continue CPAP  Weight loss    Current medicines are reviewed at length with the patient today.  The patient does not have concerns regarding medicines.  The following changes have been made:  no change  Labs/ tests ordered today include: Echo and Lexiscan Myovue   Orders Placed This Encounter  Procedures  . EKG 12-Lead     Disposition:   FU with cardiology PRN     Signed, Charlton HawsPeter Shyana Kulakowski, MD  07/25/2015 1:42 PM    [facility] [address], [place], [place]  [address] Phone: [phone]; Fax: [phone]

## 2015-07-24 DIAGNOSIS — J45901 Unspecified asthma with (acute) exacerbation: Secondary | ICD-10-CM | POA: Insufficient documentation

## 2015-07-24 DIAGNOSIS — F419 Anxiety disorder, unspecified: Secondary | ICD-10-CM | POA: Insufficient documentation

## 2015-07-24 DIAGNOSIS — Z6841 Body Mass Index (BMI) 40.0 and over, adult: Secondary | ICD-10-CM | POA: Insufficient documentation

## 2015-07-24 DIAGNOSIS — M940 Chondrocostal junction syndrome [Tietze]: Secondary | ICD-10-CM

## 2015-07-25 ENCOUNTER — Encounter: Payer: Self-pay | Admitting: *Deleted

## 2015-07-25 ENCOUNTER — Ambulatory Visit (INDEPENDENT_AMBULATORY_CARE_PROVIDER_SITE_OTHER): Payer: BLUE CROSS/BLUE SHIELD | Admitting: Cardiovascular Disease

## 2015-07-25 ENCOUNTER — Encounter: Payer: Self-pay | Admitting: Cardiovascular Disease

## 2015-07-25 VITALS — BP 124/78 | HR 76 | Ht 63.0 in | Wt 265.0 lb

## 2015-07-25 DIAGNOSIS — R079 Chest pain, unspecified: Secondary | ICD-10-CM

## 2015-07-25 DIAGNOSIS — R0600 Dyspnea, unspecified: Secondary | ICD-10-CM | POA: Diagnosis not present

## 2015-07-25 NOTE — Patient Instructions (Signed)
Your physician recommends that you schedule a follow-up appointment As Needed  Your physician recommends that you continue on your current medications as directed. Please refer to the Current Medication list given to you today.  Your physician has requested that you have an echocardiogram. Echocardiography is a painless test that uses sound waves to create images of your heart. It provides your doctor with information about the size and shape of your heart and how well your heart's chambers and valves are working. This procedure takes approximately one hour. There are no restrictions for this procedure.  Your physician has requested that you have a lexiscan myoview. For further information please visit [email or web address]. Please follow instruction sheet, as given.  If you need a refill on your cardiac medications before your next appointment, please call your pharmacy.  Thank you for choosing [facility]!

## 2015-08-03 ENCOUNTER — Encounter (HOSPITAL_COMMUNITY): Payer: Self-pay

## 2015-08-03 ENCOUNTER — Other Ambulatory Visit (HOSPITAL_COMMUNITY): Payer: Self-pay

## 2015-08-03 ENCOUNTER — Ambulatory Visit (HOSPITAL_COMMUNITY): Payer: Self-pay

## 2015-08-03 ENCOUNTER — Encounter (HOSPITAL_COMMUNITY): Payer: BLUE CROSS/BLUE SHIELD

## 2015-08-07 ENCOUNTER — Telehealth (HOSPITAL_COMMUNITY): Payer: Self-pay | Admitting: *Deleted

## 2015-08-07 ENCOUNTER — Encounter (HOSPITAL_COMMUNITY): Payer: Self-pay | Admitting: Psychiatry

## 2015-08-08 DIAGNOSIS — M47816 Spondylosis without myelopathy or radiculopathy, lumbar region: Secondary | ICD-10-CM | POA: Diagnosis not present

## 2015-08-08 DIAGNOSIS — M5136 Other intervertebral disc degeneration, lumbar region: Secondary | ICD-10-CM | POA: Diagnosis not present

## 2015-08-08 DIAGNOSIS — Z6841 Body Mass Index (BMI) 40.0 and over, adult: Secondary | ICD-10-CM | POA: Diagnosis not present

## 2015-08-09 ENCOUNTER — Ambulatory Visit (HOSPITAL_COMMUNITY)
Admission: RE | Admit: 2015-08-09 | Discharge: 2015-08-09 | Disposition: A | Discharge status: Home / Self Care | Payer: BLUE CROSS/BLUE SHIELD | Source: Ambulatory Visit | Attending: Cardiovascular Disease | Admitting: Cardiovascular Disease

## 2015-08-09 ENCOUNTER — Encounter (HOSPITAL_COMMUNITY)
Admission: RE | Admit: 2015-08-09 | Discharge: 2015-08-09 | Disposition: A | Discharge status: Home / Self Care | Payer: BLUE CROSS/BLUE SHIELD | Source: Ambulatory Visit | Attending: Cardiovascular Disease | Admitting: Cardiovascular Disease

## 2015-08-09 ENCOUNTER — Inpatient Hospital Stay (HOSPITAL_COMMUNITY): Admission: RE | Admit: 2015-08-09 | Payer: Self-pay | Source: Ambulatory Visit

## 2015-08-09 ENCOUNTER — Encounter (HOSPITAL_COMMUNITY): Payer: Self-pay

## 2015-08-09 DIAGNOSIS — R0600 Dyspnea, unspecified: Secondary | ICD-10-CM | POA: Diagnosis not present

## 2015-08-09 DIAGNOSIS — I071 Rheumatic tricuspid insufficiency: Secondary | ICD-10-CM | POA: Diagnosis not present

## 2015-08-09 DIAGNOSIS — I340 Nonrheumatic mitral (valve) insufficiency: Secondary | ICD-10-CM | POA: Insufficient documentation

## 2015-08-09 DIAGNOSIS — Z6841 Body Mass Index (BMI) 40.0 and over, adult: Secondary | ICD-10-CM | POA: Diagnosis not present

## 2015-08-09 DIAGNOSIS — I517 Cardiomegaly: Secondary | ICD-10-CM | POA: Diagnosis not present

## 2015-08-09 DIAGNOSIS — R079 Chest pain, unspecified: Secondary | ICD-10-CM | POA: Insufficient documentation

## 2015-08-09 LAB — NM MYOCAR MULTI W/SPECT W/WALL MOTION / EF
CHL CUP NUCLEAR SDS: 0
CHL CUP NUCLEAR SRS: 0
LV dias vol: 64 mL (ref 46–106)
LV sys vol: 26 mL
Peak HR: 99 {beats}/min
RATE: 0.85
Rest HR: 65 {beats}/min
SSS: 0
TID: 0.78

## 2015-08-09 MED ORDER — TECHNETIUM TC 99M SESTAMIBI GENERIC - CARDIOLITE
10.0000 | Freq: Once | INTRAVENOUS | Status: AC | PRN
  Administered 2015-08-09: 10 via INTRAVENOUS

## 2015-08-09 MED ORDER — REGADENOSON 0.4 MG/5ML IV SOLN
INTRAVENOUS | Status: AC
  Administered 2015-08-09: 0.4 mg via INTRAVENOUS
  Filled 2015-08-09: qty 5

## 2015-08-09 MED ORDER — TECHNETIUM TC 99M SESTAMIBI - CARDIOLITE
30.0000 | Freq: Once | INTRAVENOUS | Status: AC | PRN
  Administered 2015-08-09: 12:00:00 31 via INTRAVENOUS

## 2015-08-09 MED ORDER — SODIUM CHLORIDE 0.9% FLUSH
INTRAVENOUS | Status: AC
  Administered 2015-08-09: 10 mL via INTRAVENOUS
  Filled 2015-08-09: qty 10

## 2015-08-17 ENCOUNTER — Ambulatory Visit (HOSPITAL_COMMUNITY): Payer: Self-pay | Admitting: Psychiatry

## 2015-08-22 ENCOUNTER — Ambulatory Visit (HOSPITAL_COMMUNITY): Payer: Self-pay | Admitting: Psychiatry

## 2015-08-24 DIAGNOSIS — J302 Other seasonal allergic rhinitis: Secondary | ICD-10-CM | POA: Diagnosis not present

## 2015-08-24 DIAGNOSIS — Z6841 Body Mass Index (BMI) 40.0 and over, adult: Secondary | ICD-10-CM | POA: Diagnosis not present

## 2015-08-24 DIAGNOSIS — M797 Fibromyalgia: Secondary | ICD-10-CM | POA: Diagnosis not present

## 2015-08-24 DIAGNOSIS — Z1389 Encounter for screening for other disorder: Secondary | ICD-10-CM | POA: Diagnosis not present

## 2015-08-31 DIAGNOSIS — M545 Low back pain: Secondary | ICD-10-CM | POA: Diagnosis not present

## 2015-09-06 DIAGNOSIS — M5136 Other intervertebral disc degeneration, lumbar region: Secondary | ICD-10-CM | POA: Diagnosis not present

## 2015-09-06 DIAGNOSIS — M5416 Radiculopathy, lumbar region: Secondary | ICD-10-CM | POA: Diagnosis not present

## 2015-09-20 ENCOUNTER — Ambulatory Visit (HOSPITAL_COMMUNITY): Payer: Self-pay | Admitting: Psychiatry

## 2015-09-26 ENCOUNTER — Other Ambulatory Visit: Payer: Self-pay | Admitting: Orthopaedic Surgery

## 2015-09-26 DIAGNOSIS — M5416 Radiculopathy, lumbar region: Secondary | ICD-10-CM

## 2015-09-30 DIAGNOSIS — M545 Low back pain: Secondary | ICD-10-CM | POA: Diagnosis not present

## 2015-10-04 ENCOUNTER — Telehealth (HOSPITAL_COMMUNITY): Payer: Self-pay | Admitting: *Deleted

## 2015-10-05 ENCOUNTER — Other Ambulatory Visit (HOSPITAL_COMMUNITY): Payer: Self-pay | Admitting: Family Medicine

## 2015-10-05 ENCOUNTER — Ambulatory Visit (HOSPITAL_COMMUNITY)
Admission: RE | Admit: 2015-10-05 | Discharge: 2015-10-05 | Disposition: A | Discharge status: Home / Self Care | Payer: BLUE CROSS/BLUE SHIELD | Source: Ambulatory Visit | Attending: Family Medicine | Admitting: Family Medicine

## 2015-10-05 DIAGNOSIS — M25572 Pain in left ankle and joints of left foot: Secondary | ICD-10-CM | POA: Diagnosis not present

## 2015-10-05 DIAGNOSIS — R609 Edema, unspecified: Secondary | ICD-10-CM

## 2015-10-05 DIAGNOSIS — M7989 Other specified soft tissue disorders: Secondary | ICD-10-CM | POA: Diagnosis not present

## 2015-10-05 DIAGNOSIS — Z1389 Encounter for screening for other disorder: Secondary | ICD-10-CM | POA: Diagnosis not present

## 2015-10-05 DIAGNOSIS — Z6841 Body Mass Index (BMI) 40.0 and over, adult: Secondary | ICD-10-CM | POA: Diagnosis not present

## 2015-10-05 DIAGNOSIS — S93401A Sprain of unspecified ligament of right ankle, initial encounter: Secondary | ICD-10-CM | POA: Diagnosis not present

## 2015-10-06 ENCOUNTER — Other Ambulatory Visit: Payer: Self-pay

## 2015-10-25 DIAGNOSIS — J069 Acute upper respiratory infection, unspecified: Secondary | ICD-10-CM | POA: Diagnosis not present

## 2015-10-25 DIAGNOSIS — J029 Acute pharyngitis, unspecified: Secondary | ICD-10-CM | POA: Diagnosis not present

## 2015-10-25 DIAGNOSIS — Z6841 Body Mass Index (BMI) 40.0 and over, adult: Secondary | ICD-10-CM | POA: Diagnosis not present

## 2015-10-25 DIAGNOSIS — Z1389 Encounter for screening for other disorder: Secondary | ICD-10-CM | POA: Diagnosis not present

## 2015-10-31 DIAGNOSIS — M545 Low back pain: Secondary | ICD-10-CM | POA: Diagnosis not present

## 2015-11-14 ENCOUNTER — Ambulatory Visit (HOSPITAL_COMMUNITY): Payer: Self-pay | Admitting: Psychiatry

## 2015-11-21 ENCOUNTER — Encounter (HOSPITAL_COMMUNITY): Payer: Self-pay | Admitting: Psychiatry

## 2015-11-21 ENCOUNTER — Ambulatory Visit (INDEPENDENT_AMBULATORY_CARE_PROVIDER_SITE_OTHER): Payer: BLUE CROSS/BLUE SHIELD | Admitting: Psychiatry

## 2015-11-21 VITALS — BP 130/88 | Ht 63.0 in | Wt 260.0 lb

## 2015-11-21 DIAGNOSIS — F411 Generalized anxiety disorder: Secondary | ICD-10-CM | POA: Diagnosis not present

## 2015-11-21 MED ORDER — DULOXETINE HCL 60 MG PO CPEP: 60.0000 mg | ORAL_CAPSULE | Freq: Every day | ORAL | Status: DC

## 2015-11-21 MED ORDER — TRAZODONE HCL 50 MG PO TABS: 50.0000 mg | ORAL_TABLET | Freq: Every day | ORAL | Status: DC

## 2015-11-21 MED ORDER — ALPRAZOLAM 0.5 MG PO TABS: 0.5000 mg | ORAL_TABLET | Freq: Three times a day (TID) | ORAL | Status: DC | PRN

## 2015-11-21 NOTE — Progress Notes (Signed)
Psychiatric Initial Adult Assessment   Patient Identification: Virginia Trujillo MRN:  [id number] Date of Evaluation:  11/21/2015 Referral Source: Dr. Phillips OdorGolding Chief Complaint:   Chief Complaint    Depression; Anxiety; Establish Care     Visit Diagnosis:    ICD-9-CM ICD-10-CM   1. Generalized anxiety disorder 300.02 F41.1     History of Present Illness: This patient is a 50 year old married black female who lives with her husband and 2 sons ages 7524 and 4121 in [place]. 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  The patient was referred by her primary care physician, Dr. Phillips OdorGolding, for further assessment and treatment of anxiety and depression.  The patient states that she's always been a somewhat anxious person. However her depression seriously worsened about 10 years ago. Her grandmother mother and father died all within a few months of each other. She was very close to all of these family members but particular to her mother. She became more depressed and anxious. She's had an increasingly difficult time working. She is to work in some sort of Designer, fashion/clothingclothing factory but became very anxious being around people. Sometimes she would just walk off the job and not be able to handle it. At this point she spends most of her time in her house. She can't go out without a family member going with her. She recently went on a cruise with her family and had a horrible time because she was so anxious. She is on Cymbalta 60 mg but doesn't take it consistently and I explained it will not work unless it's taken daily. She's never been on any other medications for anxiety but has tried Lexapro without success.  Currently the patient feels depressed and anxious. She is not really enjoying her life. She goes to church but is too nervous to participate much. She cries on an almost daily basis. She has chronic back pain. She complains of difficulty with short-term memory and  cannot remember things without her husband reminding her. She is extremely self-conscious and when she goes out she feels like people are watching her or judging her. Her sleep is poor and she only sleeps about 4 hours a night despite having sleep apnea and using a CPAP machine. She does not use drugs or alcohol and does not have auditory or visual hallucinations paranoia suicidal or homicidal ideation. She has panic attacks on a daily basis  Associated Signs/Symptoms: Depression Symptoms:  depressed mood, anhedonia, hypersomnia, psychomotor agitation, feelings of worthlessness/guilt, difficulty concentrating, anxiety, panic attacks, loss of energy/fatigue, disturbed sleep, (Hypo) Manic Symptoms:  Distractibility, Anxiety Symptoms:  Excessive Worry, Panic Symptoms, Social Anxiety,   Past Psychiatric History: none  Previous Psychotropic Medications: Yes   Substance Abuse History in the last 12 months:  No.  Consequences of Substance Abuse: NA  Past Medical History:  Past Medical History  Diagnosis Date  . Acid reflux   . Fibromyalgia   . Anxiety   . Depression     Past Surgical History  Procedure Laterality Date  . Cesarean section    . Tubes tied    . Mouth surgery      Family Psychiatric History: The patient's sister also has a history depression and anxiety  Family History:  Family History  Problem Relation Age of Onset  . Heart disease    . Arthritis    . Cancer    . Asthma    . Diabetes    .  Kidney disease    . Hypertension Father   . Hypertension Brother   . Diabetes Brother   . Depression Brother   . Anxiety disorder Brother   . Stroke Maternal Grandmother   . Stroke Paternal Grandfather   . Congestive Heart Failure Sister   . Diabetes Sister   . Hypertension Sister     Social History:   Social History   Social History  . Marital Status: Married    Spouse Name: N/A  . Number of Children: 2  . Years of Education: 12   Occupational  History  . none   . Unemployed     Social History Main Topics  . Smoking status: Never Smoker   . Smokeless tobacco: Never Used  . Alcohol Use: No  . Drug Use: No  . Sexual Activity: Not Asked   Other Topics Concern  . None   Social History Narrative   Patient lives at home with husband and son.    Patient does not work   Patient has a high school education    Patient has 2 children.    Additional Social History: The patient grew up with both parents and asked in [place]. She is the fifth of 11 children. She had a very good childhood and states that her family is extremely close. She did have learning disabilities and was in special ed but completed high school. She can read and write fairly well but states her memory is not good. She is married and her husband is very supportive and both of her sons are as well. 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 Prednisone Other (See Comments)    Depression and crying  . Ambien [Zolpidem Tartrate] Other (See Comments)    hallucinations  . Penicillins     Metabolic Disorder Labs: No results found for: HGBA1C, MPG No results found for: PROLACTIN No results found for: CHOL, TRIG, HDL, CHOLHDL, VLDL, LDLCALC   Current Medications: Current Outpatient Prescriptions  Medication Sig Dispense Refill  . Acetaminophen (TYLENOL 8 HOUR PO) Take by mouth.      Marland Kitchen albuterol (PROVENTIL) (2.5 MG/3ML) 0.083% nebulizer solution Take 2.5 mg by nebulization every 6 (six) hours as needed for wheezing or shortness of breath.    . ALPRAZolam (XANAX) 0.5 MG tablet Take 1 tablet (0.5 mg total) by mouth 3 (three) times daily as needed for sleep or anxiety. 90 tablet 0  . Cholecalciferol (VITAMIN D PO) Take by mouth.    . DULoxetine (CYMBALTA) 60 MG capsule Take 1 capsule (60 mg total) by mouth daily. 30 capsule 2  .  gabapentin (NEURONTIN) 600 MG tablet daily  1  . ketoconazole (NIZORAL) 200 MG tablet Take 200 mg by mouth daily.    . meloxicam (MOBIC) 15 MG tablet Take 15 mg by mouth daily.      Marland Kitchen omeprazole (PRILOSEC) 40 MG capsule Take 1 capsule (40 mg total) by mouth daily. Appointment needed for further refills 30 capsule 3  . selenium sulfide (SELSUN) 2.5 % shampoo Apply 1 application topically daily as needed for irritation.    . traZODone (DESYREL) 50 MG tablet Take 1 tablet (50 mg total) by mouth at bedtime. 30 tablet 2   Current Facility-Administered Medications  Medication Dose Route Frequency Provider Last Rate Last Dose  . methylPREDNISolone acetate (DEPO-MEDROL) injection 40 mg  40 mg Intra-articular Once Vickki Hearing, MD        Neurologic: Headache: No Seizure: No Paresthesias:No  Musculoskeletal: Strength & Muscle Tone: within normal limits Gait & Station: normal Patient leans: N/A  Psychiatric Specialty Exam: Review of Systems  Musculoskeletal: Positive for back pain.  Psychiatric/Behavioral: Positive for depression and memory loss. The patient is nervous/anxious and has insomnia.   All other systems reviewed and are negative.   Blood pressure 130/88, height  (1.6 m), weight 260 lb (117.935 kg).Body mass index is 46.07 kg/(m^2).  General Appearance: Casual, Neat and Well Groomed  Eye Contact:  Fair  Speech:  Clear and Coherent and Garbled  Volume:  Normal  Mood:  Anxious and Depressed  Affect:  Constricted, Depressed and Tearful  Thought Process:  Goal Directed  Orientation:  Full (Time, Place, and Person)  Thought Content:  Rumination  Suicidal Thoughts:  No  Homicidal Thoughts:  No  Memory:  Immediate;   Fair Recent;   Poor Remote;   Poor  Judgement:  Fair  Insight:  Lacking  Psychomotor Activity:  Normal  Concentration:  Concentration: Poor and Attention Span: Poor  Recall:  Poor  Fund of Knowledge:Fair  Language: Good  Akathisia:  No  Handed:   Right  AIMS (if indicated):    Assets:  Communication Skills Desire for Improvement Social Support  ADL's:  Intact  Cognition: WNL  Sleep:  poor    Treatment Plan Summary: Medication management   This patient is a 50 year old female with a history of significant depression and anxiety since her mother's death 10 years ago. She is very debilitated and almost unable to leave her house. Strongly emphasized the need for her to take the Cymbalta on a daily basis and she agrees. She can also continue the trazodone 50,000,000 g at bedtime to help with sleep. She will also start Xanax 0.5 mg up to 3 times a day for anxiety. Finally, she will be scheduled for counseling here. She'll return to see me in 4 weeks   Diannia Ruder, MD 7/19/201711:30 AM

## 2015-11-30 DIAGNOSIS — M545 Low back pain: Secondary | ICD-10-CM | POA: Diagnosis not present

## 2015-12-18 ENCOUNTER — Ambulatory Visit (HOSPITAL_COMMUNITY): Payer: Self-pay | Admitting: Psychiatry

## 2015-12-25 ENCOUNTER — Ambulatory Visit (INDEPENDENT_AMBULATORY_CARE_PROVIDER_SITE_OTHER): Payer: BLUE CROSS/BLUE SHIELD | Admitting: Psychiatry

## 2015-12-25 ENCOUNTER — Encounter (HOSPITAL_COMMUNITY): Payer: Self-pay | Admitting: Psychiatry

## 2015-12-25 VITALS — Ht 63.0 in | Wt 259.4 lb

## 2015-12-25 DIAGNOSIS — F411 Generalized anxiety disorder: Secondary | ICD-10-CM | POA: Diagnosis not present

## 2015-12-25 MED ORDER — TRAZODONE HCL 100 MG PO TABS: 100.0000 mg | ORAL_TABLET | Freq: Every day | ORAL | 2 refills | Status: DC

## 2015-12-25 MED ORDER — DULOXETINE HCL 60 MG PO CPEP: 60.0000 mg | ORAL_CAPSULE | Freq: Two times a day (BID) | ORAL | 2 refills | Status: DC

## 2015-12-25 NOTE — Progress Notes (Signed)
Psychiatric Initial Adult Assessment   Patient Identification: Virginia Trujillo MRN:  [id number] Date of Evaluation:  12/25/2015 Referral Source: Dr. Phillips OdorGolding Chief Complaint:   Chief Complaint    Depression; Anxiety; Follow-up     Visit Diagnosis:    ICD-9-CM ICD-10-CM   1. Generalized anxiety disorder 300.02 F41.1     History of Present Illness: This patient is a 50 year old married black female who lives with her husband and 2 sons ages 6424 and 5021 in [place]. 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  The patient was referred by her primary care physician, Dr. Phillips OdorGolding, for further assessment and treatment of anxiety and depression.  The patient states that she's always been a somewhat anxious person. However her depression seriously worsened about 10 years ago. Her grandmother mother and father died all within a few months of each other. She was very close to all of these family members but particular to her mother. She became more depressed and anxious. She's had an increasingly difficult time working. She is to work in some sort of Designer, fashion/clothingclothing factory but became very anxious being around people. Sometimes she would just walk off the job and not be able to handle it. At this point she spends most of her time in her house. She can't go out without a family member going with her. She recently went on a cruise with her family and had a horrible time because she was so anxious. She is on Cymbalta 60 mg but doesn't take it consistently and I explained it will not work unless it's taken daily. She's never been on any other medications for anxiety but has tried Lexapro without success.  Currently the patient feels depressed and anxious. She is not really enjoying her life. She goes to church but is too nervous to participate much. She cries on an almost daily basis. She has chronic back pain. She complains of difficulty with short-term memory and cannot  remember things without her husband reminding her. She is extremely self-conscious and when she goes out she feels like people are watching her or judging her. Her sleep is poor and she only sleeps about 4 hours a night despite having sleep apnea and using a CPAP machine. She does not use drugs or alcohol and does not have auditory or visual hallucinations paranoia suicidal or homicidal ideation. She has panic attacks on a daily basis  The patient returns after 4 weeks. She states that she is slightly better since she has been taking her medication consistently. However today she has a bad migraine headache. She is still not sleeping well on the trazodone and still doesn't have much energy or interest. She denies suicidal ideation. The Xanax is helping her anxiety. I suggested that we need to go up on both the Cymbalta and the trazodone and she agrees  Associated Signs/Symptoms: Depression Symptoms:  depressed mood, anhedonia, hypersomnia, psychomotor agitation, feelings of worthlessness/guilt, difficulty concentrating, anxiety, panic attacks, loss of energy/fatigue, disturbed sleep, (Hypo) Manic Symptoms:  Distractibility, Anxiety Symptoms:  Excessive Worry, Panic Symptoms, Social Anxiety,   Past Psychiatric History: none  Previous Psychotropic Medications: Yes   Substance Abuse History in the last 12 months:  No.  Consequences of Substance Abuse: NA  Past Medical History:  Past Medical History:  Diagnosis Date  . Acid reflux   . Anxiety   . Depression   . Fibromyalgia     Past Surgical History:  Procedure Laterality  Date  . CESAREAN SECTION    . MOUTH SURGERY    . tubes tied      Family Psychiatric History: The patient's sister also has a history depression and anxiety  Family History:  Family History  Problem Relation Age of Onset  . Heart disease    . Arthritis    . Cancer    . Asthma    . Diabetes    . Kidney disease    . Hypertension Father   .  Hypertension Brother   . Diabetes Brother   . Depression Brother   . Anxiety disorder Brother   . Stroke Maternal Grandmother   . Stroke Paternal Grandfather   . Congestive Heart Failure Sister   . Diabetes Sister   . Hypertension Sister     Social History:   Social History   Social History  . Marital status: Married    Spouse name: N/A  . Number of children: 2  . Years of education: 12   Occupational History  . none Unemployed  . Unemployed     Social History Main Topics  . Smoking status: Never Smoker  . Smokeless tobacco: Never Used  . Alcohol use No  . Drug use: No  . Sexual activity: Not Asked   Other Topics Concern  . None   Social History Narrative   Patient lives at home with husband and son.    Patient does not work   Patient has a high school education    Patient has 2 children.    Additional Social History: The patient grew up with both parents and asked in [place]. She is the fifth of 11 children. She had a very good childhood and states that her family is extremely close. She did have learning disabilities and was in special ed but completed high school. She can read and write fairly well but states her memory is not good. She is married and her husband is very supportive and both of her sons are as well. 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 Prednisone Other (See Comments)    Depression and crying  . Ambien [Zolpidem Tartrate] Other (See Comments)    hallucinations  . Penicillins     Metabolic Disorder Labs: No results found for: HGBA1C, MPG No results found for: PROLACTIN No results found for: CHOL, TRIG, HDL, CHOLHDL, VLDL, LDLCALC   Current Medications: Current Outpatient Prescriptions  Medication Sig Dispense Refill  . Acetaminophen (TYLENOL 8 HOUR PO) Take by mouth.      Marland Kitchen albuterol (PROVENTIL) (2.5  MG/3ML) 0.083% nebulizer solution Take 2.5 mg by nebulization every 6 (six) hours as needed for wheezing or shortness of breath.    . ALPRAZolam (XANAX) 0.5 MG tablet Take 1 tablet (0.5 mg total) by mouth 3 (three) times daily as needed for sleep or anxiety. 90 tablet 0  . Cholecalciferol (VITAMIN D PO) Take by mouth.    . DULoxetine (CYMBALTA) 60 MG capsule Take 1 capsule (60 mg total) by mouth 2 (two) times daily. 30 capsule 2  . gabapentin (NEURONTIN) 600 MG tablet daily  1  . meloxicam (MOBIC) 15 MG tablet Take 15 mg by mouth daily.      Marland Kitchen omeprazole (PRILOSEC) 40 MG capsule Take 1 capsule (40 mg total) by mouth daily. Appointment needed for further refills 30 capsule 3  .  selenium sulfide (SELSUN) 2.5 % shampoo Apply 1 application topically daily as needed for irritation.    . SUMAtriptan (IMITREX) 25 MG tablet Take 25 mg by mouth every 2 (two) hours as needed for migraine. May repeat in 2 hours if headache persists or recurs.    . traZODone (DESYREL) 100 MG tablet Take 1 tablet (100 mg total) by mouth at bedtime. 30 tablet 2   Current Facility-Administered Medications  Medication Dose Route Frequency Provider Last Rate Last Dose  . methylPREDNISolone acetate (DEPO-MEDROL) injection 40 mg  40 mg Intra-articular Once Vickki HearingStanley E Harrison, MD        Neurologic: Headache: No Seizure: No Paresthesias:No  Musculoskeletal: Strength & Muscle Tone: within normal limits Gait & Station: normal Patient leans: N/A  Psychiatric Specialty Exam: Review of Systems  Musculoskeletal: Positive for back pain.  Psychiatric/Behavioral: Positive for depression and memory loss. The patient is nervous/anxious and has insomnia.   All other systems reviewed and are negative.   Height 5\' 3"  (1.6 m), weight 259 lb 6.4 oz (117.7 kg), SpO2 98 %.Body mass index is 45.95 kg/m.  General Appearance: Casual, Neat and Well Groomed  Eye Contact:  Fair  Speech:  Clear and Coherent and Garbled  Volume:  Normal   Mood: Depressed   Affect:  Constricted   Thought Process:  Goal Directed  Orientation:  Full (Time, Place, and Person)  Thought Content:  Rumination  Suicidal Thoughts:  No  Homicidal Thoughts:  No  Memory:  Immediate;   Fair Recent;   Poor Remote;   Poor  Judgement:  Fair  Insight:  Lacking  Psychomotor Activity:  Normal  Concentration:  Concentration: Poor and Attention Span: Poor  Recall:  Poor  Fund of Knowledge:Fair  Language: Good  Akathisia:  No  Handed:  Right  AIMS (if indicated):    Assets:  Communication Skills Desire for Improvement Social Support  ADL's:  Intact  Cognition: WNL  Sleep:  poor    Treatment Plan Summary: Medication management   The patient will increase Cymbalta to 60 mg twice a day for depression and increase trazodone to 100 mg at bedtime for sleep. She'll continue Xanax 0.5 mg 3 times a day for anxiety and return to see me in 4 weeks. We will make sure she gets set up for counseling   Diannia RuderOSS, Yaneliz Radebaugh, MD 8/22/20174:08 PM

## 2015-12-31 DIAGNOSIS — M545 Low back pain: Secondary | ICD-10-CM | POA: Diagnosis not present

## 2016-01-05 ENCOUNTER — Other Ambulatory Visit (HOSPITAL_COMMUNITY): Payer: Self-pay | Admitting: Psychiatry

## 2016-01-08 ENCOUNTER — Telehealth (HOSPITAL_COMMUNITY): Payer: Self-pay | Admitting: *Deleted

## 2016-01-08 MED ORDER — ALPRAZOLAM 0.5 MG PO TABS: 0.5000 mg | ORAL_TABLET | Freq: Three times a day (TID) | ORAL | 0 refills | Status: DC | PRN

## 2016-01-08 NOTE — Telephone Encounter (Signed)
Pt pharmacy requesting refills for her Xanax. Pt medication last filled on 11-21-2015 with 90 tabs 0 refills. Pt was last seen 12-25-2015 and have f/u on 01-22-2016. Pt pharmacy number is [phone].

## 2016-01-08 NOTE — Telephone Encounter (Signed)
Medication called into pt pharmacy for 90 tabs 0 refills to pt pharmacy. Spoke with [facility].

## 2016-01-08 NOTE — Telephone Encounter (Signed)
Per provider to call in enough medication to last patient until next appt. [facility] to give pt 1 mo supply with 0 refills for insurance to approve medication per provider.

## 2016-01-08 NOTE — Telephone Encounter (Signed)
You may call in enough to last until appt 

## 2016-01-14 DIAGNOSIS — T07 Unspecified multiple injuries: Secondary | ICD-10-CM | POA: Diagnosis not present

## 2016-01-14 DIAGNOSIS — R7309 Other abnormal glucose: Secondary | ICD-10-CM | POA: Diagnosis not present

## 2016-01-14 DIAGNOSIS — W57XXXA Bitten or stung by nonvenomous insect and other nonvenomous arthropods, initial encounter: Secondary | ICD-10-CM | POA: Diagnosis not present

## 2016-01-14 DIAGNOSIS — M797 Fibromyalgia: Secondary | ICD-10-CM | POA: Diagnosis not present

## 2016-01-14 DIAGNOSIS — Z6841 Body Mass Index (BMI) 40.0 and over, adult: Secondary | ICD-10-CM | POA: Diagnosis not present

## 2016-01-14 DIAGNOSIS — D508 Other iron deficiency anemias: Secondary | ICD-10-CM | POA: Diagnosis not present

## 2016-01-14 DIAGNOSIS — Z1389 Encounter for screening for other disorder: Secondary | ICD-10-CM | POA: Diagnosis not present

## 2016-01-22 ENCOUNTER — Ambulatory Visit (HOSPITAL_COMMUNITY): Payer: Self-pay | Admitting: Psychiatry

## 2016-01-31 DIAGNOSIS — M545 Low back pain: Secondary | ICD-10-CM | POA: Diagnosis not present

## 2016-02-04 ENCOUNTER — Encounter (HOSPITAL_COMMUNITY): Payer: Self-pay | Admitting: Psychiatry

## 2016-02-04 ENCOUNTER — Ambulatory Visit (INDEPENDENT_AMBULATORY_CARE_PROVIDER_SITE_OTHER): Payer: BLUE CROSS/BLUE SHIELD | Admitting: Psychiatry

## 2016-02-04 DIAGNOSIS — F411 Generalized anxiety disorder: Secondary | ICD-10-CM | POA: Diagnosis not present
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
## 2016-02-06 ENCOUNTER — Ambulatory Visit (INDEPENDENT_AMBULATORY_CARE_PROVIDER_SITE_OTHER): Payer: BLUE CROSS/BLUE SHIELD | Admitting: Psychiatry

## 2016-02-06 ENCOUNTER — Encounter (HOSPITAL_COMMUNITY): Payer: Self-pay | Admitting: Psychiatry

## 2016-02-06 ENCOUNTER — Encounter (INDEPENDENT_AMBULATORY_CARE_PROVIDER_SITE_OTHER): Payer: Self-pay

## 2016-02-06 VITALS — BP 111/78 | HR 74 | Ht 63.0 in | Wt 258.0 lb

## 2016-02-06 DIAGNOSIS — F411 Generalized anxiety disorder: Secondary | ICD-10-CM | POA: Diagnosis not present

## 2016-02-06 MED ORDER — ARIPIPRAZOLE 2 MG PO TABS: 2.0000 mg | ORAL_TABLET | Freq: Every day | ORAL | 2 refills | Status: DC

## 2016-02-06 MED ORDER — ALPRAZOLAM 1 MG PO TABS: 1.0000 mg | ORAL_TABLET | Freq: Three times a day (TID) | ORAL | 2 refills | Status: DC | PRN

## 2016-02-06 MED ORDER — TRAZODONE HCL 100 MG PO TABS: 100.0000 mg | ORAL_TABLET | Freq: Every day | ORAL | 2 refills | Status: DC

## 2016-02-06 MED ORDER — DULOXETINE HCL 60 MG PO CPEP: 60.0000 mg | ORAL_CAPSULE | Freq: Two times a day (BID) | ORAL | 2 refills | Status: DC

## 2016-02-06 NOTE — Progress Notes (Signed)
Psychiatric Initial Adult Assessment   Patient Identification: Virginia Trujillo MRN:  [id number] Date of Evaluation:  02/06/2016 Referral Source: Dr. Phillips OdorGolding Chief Complaint:   Chief Complaint    Depression; Anxiety; Follow-up     Visit Diagnosis:    ICD-9-CM ICD-10-CM   1. Generalized anxiety disorder 300.02 F41.1     History of Present Illness: This patient is a 50 year old married black female who lives with her husband and 2 sons ages 2624 and 4021 in [place]. 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  The patient was referred by her primary care physician, Dr. Phillips OdorGolding, for further assessment and treatment of anxiety and depression.  The patient states that she's always been a somewhat anxious person. However her depression seriously worsened about 10 years ago. Her grandmother mother and father died all within a few months of each other. She was very close to all of these family members but particular to her mother. She became more depressed and anxious. She's had an increasingly difficult time working. She is to work in some sort of Designer, fashion/clothingclothing factory but became very anxious being around people. Sometimes she would just walk off the job and not be able to handle it. At this point she spends most of her time in her house. She can't go out without a family member going with her. She recently went on a cruise with her family and had a horrible time because she was so anxious. She is on Cymbalta 60 mg but doesn't take it consistently and I explained it will not work unless it's taken daily. She's never been on any other medications for anxiety but has tried Lexapro without success.  Currently the patient feels depressed and anxious. She is not really enjoying her life. She goes to church but is too nervous to participate much. She cries on an almost daily basis. She has chronic back pain. She complains of difficulty with short-term memory and cannot  remember things without her husband reminding her. She is extremely self-conscious and when she goes out she feels like people are watching her or judging her. Her sleep is poor and she only sleeps about 4 hours a night despite having sleep apnea and using a CPAP machine. She does not use drugs or alcohol and does not have auditory or visual hallucinations paranoia suicidal or homicidal ideation. She has panic attacks on a daily basis  The patient returns after 2 months. She states that her depression got really bad and her husband [place] [facility] [place] [facility] in this light and her mood a little bit but she was still very anxious while there. She's very nervous being around people. She thinks she needs an increase in her Xanax. She's also getting more paranoid at night and thinks someone is in her closet. I suggested we add a low-dose of an antipsychotic like Abilify and she agrees. She denies suicidal ideation and states that the Cymbalta has helped her mood and her back pain but she is still going toward orthopedic doctor. She is taking her medication consistently  Associated Signs/Symptoms: Depression Symptoms:  depressed mood, anhedonia, hypersomnia, psychomotor agitation, feelings of worthlessness/guilt, difficulty concentrating, anxiety, panic attacks, loss of energy/fatigue, disturbed sleep, (Hypo) Manic Symptoms:  Distractibility, Anxiety Symptoms:  Excessive Worry, Panic Symptoms, Social Anxiety,   Past Psychiatric History: none  Previous Psychotropic Medications: Yes   Substance Abuse History in the last 12 months:  No.  Consequences of Substance  Abuse: NA  Past Medical History:  Past Medical History:  Diagnosis Date  . Acid reflux   . Anxiety   . Depression   . Fibromyalgia     Past Surgical History:  Procedure Laterality Date  . CESAREAN SECTION    . MOUTH SURGERY    . tubes tied      Family Psychiatric History: The patient's sister also has a history depression  and anxiety  Family History:  Family History  Problem Relation Age of Onset  . Hypertension Father   . Hypertension Brother   . Diabetes Brother   . Depression Brother   . Anxiety disorder Brother   . Stroke Maternal Grandmother   . Stroke Paternal Grandfather   . Congestive Heart Failure Sister   . Diabetes Sister   . Hypertension Sister   . Anxiety disorder Sister   . Depression Sister   . Heart disease    . Arthritis    . Cancer    . Asthma    . Diabetes    . Kidney disease      Social History:   Social History   Social History  . Marital status: Married    Spouse name: N/A  . Number of children: 2  . Years of education: 12   Occupational History  . none Unemployed  . Unemployed     Social History Main Topics  . Smoking status: Never Smoker  . Smokeless tobacco: Never Used  . Alcohol use No  . Drug use: No  . Sexual activity: Yes    Birth control/ protection: Surgical   Other Topics Concern  . None   Social History Narrative   Patient lives at home with husband and son.    Patient does not work   Patient has a high school education    Patient has 2 children.    Additional Social History: The patient grew up with both parents and asked in [place]. She is the fifth of 11 children. She had a very good childhood and states that her family is extremely close. She did have learning disabilities and was in special ed but completed high school. She can read and write fairly well but states her memory is not good. She is married and her husband is very supportive and both of her sons are as well. 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 Prednisone Other (See Comments)    Depression and crying  . Ambien [Zolpidem Tartrate] Other (See Comments)    hallucinations  . Penicillins     Metabolic Disorder Labs: No results found  for: HGBA1C, MPG No results found for: PROLACTIN No results found for: CHOL, TRIG, HDL, CHOLHDL, VLDL, LDLCALC   Current Medications: Current Outpatient Prescriptions  Medication Sig Dispense Refill  . Acetaminophen (TYLENOL 8 HOUR PO) Take by mouth.      Marland Kitchen albuterol (PROVENTIL) (2.5 MG/3ML) 0.083% nebulizer solution Take 2.5 mg by nebulization every 6 (six) hours as needed for wheezing or shortness of breath.    . baclofen (LIORESAL) 10 MG tablet Take 10 mg by mouth 3 (three) times daily.    . Cholecalciferol (VITAMIN D PO) Take by mouth.    . doxycycline (VIBRAMYCIN) 100 MG capsule Take 100 mg by mouth 2 (two) times daily.    . DULoxetine (CYMBALTA) 60 MG capsule Take 1 capsule (  60 mg total) by mouth 2 (two) times daily. 30 capsule 2  . gabapentin (NEURONTIN) 600 MG tablet daily  1  . meloxicam (MOBIC) 15 MG tablet Take 15 mg by mouth daily.      . montelukast (SINGULAIR) 10 MG tablet Take 10 mg by mouth at bedtime.    Marland Kitchen omeprazole (PRILOSEC) 40 MG capsule Take 1 capsule (40 mg total) by mouth daily. Appointment needed for further refills 30 capsule 3  . selenium sulfide (SELSUN) 2.5 % shampoo Apply 1 application topically daily as needed for irritation.    . SUMAtriptan (IMITREX) 25 MG tablet Take 25 mg by mouth every 2 (two) hours as needed for migraine. May repeat in 2 hours if headache persists or recurs.    . TRAMADOL HCL PO Take by mouth.    . traZODone (DESYREL) 100 MG tablet Take 1 tablet (100 mg total) by mouth at bedtime. 30 tablet 2  . ALPRAZolam (XANAX) 1 MG tablet Take 1 tablet (1 mg total) by mouth 3 (three) times daily as needed for anxiety. 90 tablet 2  . ARIPiprazole (ABILIFY) 2 MG tablet Take 1 tablet (2 mg total) by mouth at bedtime. 30 tablet 2   Current Facility-Administered Medications  Medication Dose Route Frequency Provider Last Rate Last Dose  . methylPREDNISolone acetate (DEPO-MEDROL) injection 40 mg  40 mg Intra-articular Once Vickki Hearing, MD         Neurologic: Headache: No Seizure: No Paresthesias:No  Musculoskeletal: Strength & Muscle Tone: within normal limits Gait & Station: normal Patient leans: N/A  Psychiatric Specialty Exam: Review of Systems  Musculoskeletal: Positive for back pain.  Psychiatric/Behavioral: Positive for depression and memory loss. The patient is nervous/anxious and has insomnia.   All other systems reviewed and are negative.   Blood pressure 111/78, pulse 74, height 5\' 3"  (1.6 m), weight 258 lb (117 kg), SpO2 98 %.Body mass index is 45.7 kg/m.  General Appearance: Casual, Neat and Well Groomed  Eye Contact:  Fair  Speech:  Clear and Coherent and Garbled  Volume:  Normal  Mood: Dysphoric but a little better   Affect:  Constricted   Thought Process:  Goal Directed  Orientation:  Full (Time, Place, and Person)  Thought Content:  Rumination  Suicidal Thoughts:  No  Homicidal Thoughts:  No  Memory:  Immediate;   Fair Recent;   Poor Remote;   Poor  Judgement:  Fair  Insight:  Lacking  Psychomotor Activity:  Normal  Concentration:  Concentration: Poor and Attention Span: Poor  Recall:  Poor  Fund of Knowledge:Fair  Language: Good  Akathisia:  No  Handed:  Right  AIMS (if indicated):    Assets:  Communication Skills Desire for Improvement Social Support  ADL's:  Intact  Cognition: WNL  Sleep:  poor    Treatment Plan Summary: Medication management   The patient will Continue Cymbalta to 60 mg twice a day for depression and trazodone 100 mg at bedtime for sleep. She'll continue Xanax but increase the dosage to 1 mg 3 times a day. She'll also start Abilify 2 mg at bedtime for auditory hallucinations and augmentation. She'll continue her counseling with Florencia Reasons and return to see me in 6 weeks.   Diannia Ruder, MD 10/4/20172:51 PM

## 2016-02-07 DIAGNOSIS — M5136 Other intervertebral disc degeneration, lumbar region: Secondary | ICD-10-CM | POA: Diagnosis not present

## 2016-02-07 DIAGNOSIS — M797 Fibromyalgia: Secondary | ICD-10-CM | POA: Diagnosis not present

## 2016-02-07 DIAGNOSIS — M545 Low back pain: Secondary | ICD-10-CM | POA: Diagnosis not present

## 2016-02-18 ENCOUNTER — Ambulatory Visit (HOSPITAL_COMMUNITY): Payer: Self-pay | Admitting: Psychiatry

## 2016-03-01 DIAGNOSIS — M545 Low back pain: Secondary | ICD-10-CM | POA: Diagnosis not present

## 2016-03-03 DIAGNOSIS — M5416 Radiculopathy, lumbar region: Secondary | ICD-10-CM | POA: Diagnosis not present

## 2016-03-06 ENCOUNTER — Encounter (HOSPITAL_COMMUNITY): Payer: Self-pay | Admitting: Psychiatry

## 2016-03-06 ENCOUNTER — Ambulatory Visit (INDEPENDENT_AMBULATORY_CARE_PROVIDER_SITE_OTHER): Payer: BLUE CROSS/BLUE SHIELD | Admitting: Psychiatry

## 2016-03-06 DIAGNOSIS — F411 Generalized anxiety disorder: Secondary | ICD-10-CM | POA: Diagnosis not present

## 2016-03-06 NOTE — Progress Notes (Signed)
Patient:  Virginia Trujillo   DOB: 05/16/1965  MR Number: [id number]  Location: [facility]:  [address] [place],  [place], [id number]  Start: Thursday 03/06/2016 2:00 PM End: Thursday 03/06/2016 3:00 PM  Provider/Observer:     Florencia ReasonsPeggy Filimon Miranda, MSW, LCSW   Chief Complaint:      Chief Complaint  Patient presents with  . Anxiety    Reason For Service:   Patient presents with symptoms of anxiety that have been lilfelong per patient's report but symptoms worsened when her mother died 15 years ago. Also during the same 2 month period around her mother's death,her grandparents and her sister died. Patient states being nervous and not wanting to be around people. She says she just feels safer when she locks the door and she sleeps with  the light on. She also reports having panic attacks every other day over simple things like things not going right. She fears been around people and worries something bad is going to happen. Current stressors include her father being diagnosed with prostate cancer about a month ago, her brother became blind this year due to diabetes and attempted suicide about 3 months ago,. Patient reports seeing a psyhiatrist once in early adolescence due to isolative behaviors and anxiety.  She recently began seeing psychiatrist Dr. Tenny Crawoss. She reports no previous involvement in outpatient therapy. Patient reports no psychiatric hospitalizations.      Interventions Strategy:  Supportive   Participation Level:   Active  Participation Quality:  Appropriate      Behavioral Observation:  Casual, Alert, and Constricted.   Current Psychosocial Factors: Father has prostate cancer, her brother attempted suicide 3 months ago, grief and loss issues  Content of Session:   Established rapport, reviewed symptoms, facilitated expression of feelings, discussed the chemistry of anxiety and effects on the body, discussed rationale for and assisted patient practice controlled breathing,  assisted patient identify ways to improve self-care through the use of daily planning  Current Status:   isolative behaviors, panic attacks, nervousness, sleep difficulty, memory difficulty, excessive worry, daily crying spells, depressed mood  Suicidal/Homicidal:   No  Patient Progress:   Poor. Patient reports little to no change in symptoms. She is taking medication as prescribed by psychiatrist Dr. Tenny Crawoss. She says xanax don't seem to be strong enough because she still is nervous and continues to experience frequent panic attacks. She says the abilify is helping because hallucinations have decreased.   She also states not wanting to be around people. She reports becoming depressed with the upcoming holidays as this triggers grief and loss issues related to deceased relatives. She expresses worry about upcoming disability [place] in [place] in January 2018. She expresses fear her father who has prostate cancer may die like her brother-n-law died last year. She reports constantly picking her fingers and twriling her hair.  She reports feeling better about brother as his condition is betting better.   Target Goals:   1. Establish rapport. 2. Learn and implement calming skills to manage and reduce overall anxiety. 3. Improve self-care  Last Reviewed:     Goals Addressed Today:    1,2,3  Plan:      Return in 2 weeks  Impression/Diagnosis:   Patient presents with symptoms of anxiety that have been lilfelong per patient's report but symptoms worsened when her mother died 15 years ago. Also during the same 2 month period around her mother's death,her grandparents and her sister died. Current symptoms include isolative behaviors, panic  attacks, nervousness, sleep difficulty, memory difficulty, persistent worry, daily crying spells, depressed mood  Diagnosis:  Axis I: Generalized anxiety disorder          Axis II: Deferred   Huberta Tompkins, LCSW 03/06/2016

## 2016-03-19 ENCOUNTER — Ambulatory Visit (HOSPITAL_COMMUNITY): Payer: Self-pay | Admitting: Psychiatry

## 2016-04-01 ENCOUNTER — Encounter (HOSPITAL_COMMUNITY): Payer: Self-pay | Admitting: Psychiatry

## 2016-04-01 ENCOUNTER — Ambulatory Visit (INDEPENDENT_AMBULATORY_CARE_PROVIDER_SITE_OTHER): Payer: BLUE CROSS/BLUE SHIELD | Admitting: Psychiatry

## 2016-04-01 DIAGNOSIS — F411 Generalized anxiety disorder: Secondary | ICD-10-CM | POA: Diagnosis not present

## 2016-04-01 DIAGNOSIS — M545 Low back pain: Secondary | ICD-10-CM | POA: Diagnosis not present

## 2016-04-01 NOTE — Progress Notes (Signed)
Patient:  Virginia Trujillo   DOB: 09/19/1965  MR Number: [id number]  Location: [facility]:  [address] ,  [place], [id number]  Start: Tuesday 04/01/2016 3:12 PM  End: Tuesday 04/01/2016 3:58 PM           Provider/Observer:     Florencia ReasonsPeggy Annaleah Arata, MSW, LCSW   Chief Complaint:      No chief complaint on file.   Reason For Service:   Patient presents with symptoms of anxiety that have been lilfelong per patient's report but symptoms worsened when her mother died 15 years ago. Also during the same 2 month period around her mother's death,her grandparents and her sister died. Patient states being nervous and not wanting to be around people. She says she just feels safer when she locks the door and she sleeps with  the light on. She also reports having panic attacks every other day over simple things like things not going right. She fears been around people and worries something bad is going to happen. Current stressors include her father being diagnosed with prostate cancer about a month ago, her brother became blind this year due to diabetes and attempted suicide about 3 months ago,. Patient reports seeing a psyhiatrist once in early adolescence due to isolative behaviors and anxiety.  She recently began seeing psychiatrist Dr. Tenny Crawoss. She reports no previous involvement in outpatient therapy. Patient reports no psychiatric hospitalizations.      Interventions Strategy:  Supportive   Participation Level:   Active  Participation Quality:  Appropriate      Behavioral Observation:  Casual, Alert, and Constricted.   Current Psychosocial Factors: Father has prostate cancer, her brother attempted suicide 3 months ago, grief and loss issues  Content of Session:    reviewed symptoms, facilitated expression of feelings, praised and reinforced patient's efforts to improve self-care, assisted patient identify ways to maintain consistent self-care, praised and reinforced patient's use of  controlled breathing, discussed the effects of use of controlled breathing, reviewed rationale for practicing controlled breathing consistently.  Current Status:   Decreased isolative behaviors, decreased panic attacks, decreasednervousness, sleep difficulty, memory difficulty, decreased worry, decreased  crying spells, improved mood  Suicidal/Homicidal:   No  Patient Progress:   Good. Patient reports feeling better since less session. She has had spinal injections which has alleviated some of her back pain. She reports improved self care efforts regarding eating patterns, exercise, and sleep hygiene. She reports experiencing more energy. She also has begun to resume interest in some activities including crafts and states she has started making throw pillows by hand.  She reports having 2 crying spells since last session and reports this was triggered by memories of deceased loved ones around the holidays. She reports having 2 panic attacks since last session. She reports she has been using controlled breathing and says it has been helpful to manage panic attacks.   Target Goals:   1 Learn and implement calming skills to manage and reduce overall anxiety. 2. Improve self-care  Last Reviewed:     Goals Addressed Today:    1,2,  Plan:      Return in 2 weeks  Impression/Diagnosis:   Patient presents with symptoms of anxiety that have been lilfelong per patient's report but symptoms worsened when her mother died 15 years ago. Also during the same 2 month period around her mother's death,her grandparents and her sister died. Current symptoms include isolative behaviors, panic attacks, nervousness, sleep difficulty, memory difficulty, persistent worry, daily  crying spells, depressed mood  Diagnosis:  Axis I: Generalized anxiety disorder          Axis II: Deferred   Virginia Nebergall, LCSW 04/01/2016

## 2016-04-08 ENCOUNTER — Ambulatory Visit (INDEPENDENT_AMBULATORY_CARE_PROVIDER_SITE_OTHER): Payer: BLUE CROSS/BLUE SHIELD | Admitting: Psychiatry

## 2016-04-08 ENCOUNTER — Encounter (HOSPITAL_COMMUNITY): Payer: Self-pay | Admitting: Psychiatry

## 2016-04-08 VITALS — BP 129/76 | HR 57 | Wt 260.0 lb

## 2016-04-08 DIAGNOSIS — Z833 Family history of diabetes mellitus: Secondary | ICD-10-CM | POA: Diagnosis not present

## 2016-04-08 DIAGNOSIS — Z888 Allergy status to other drugs, medicaments and biological substances status: Secondary | ICD-10-CM

## 2016-04-08 DIAGNOSIS — Z823 Family history of stroke: Secondary | ICD-10-CM

## 2016-04-08 DIAGNOSIS — F411 Generalized anxiety disorder: Secondary | ICD-10-CM

## 2016-04-08 DIAGNOSIS — Z79899 Other long term (current) drug therapy: Secondary | ICD-10-CM

## 2016-04-08 DIAGNOSIS — Z9889 Other specified postprocedural states: Secondary | ICD-10-CM | POA: Diagnosis not present

## 2016-04-08 DIAGNOSIS — Z818 Family history of other mental and behavioral disorders: Secondary | ICD-10-CM

## 2016-04-08 DIAGNOSIS — Z8249 Family history of ischemic heart disease and other diseases of the circulatory system: Secondary | ICD-10-CM

## 2016-04-08 DIAGNOSIS — Z880 Allergy status to penicillin: Secondary | ICD-10-CM

## 2016-04-08 MED ORDER — DULOXETINE HCL 60 MG PO CPEP: 60.0000 mg | ORAL_CAPSULE | Freq: Two times a day (BID) | ORAL | 2 refills | Status: DC

## 2016-04-08 MED ORDER — TRAZODONE HCL 100 MG PO TABS: 100.0000 mg | ORAL_TABLET | Freq: Every day | ORAL | 2 refills | Status: DC

## 2016-04-08 MED ORDER — ARIPIPRAZOLE 2 MG PO TABS: 2.0000 mg | ORAL_TABLET | Freq: Every day | ORAL | 2 refills | Status: DC

## 2016-04-08 MED ORDER — ALPRAZOLAM 1 MG PO TABS: 1.0000 mg | ORAL_TABLET | Freq: Three times a day (TID) | ORAL | 2 refills | Status: DC | PRN

## 2016-04-08 NOTE — Progress Notes (Signed)
Psychiatric Initial Adult Assessment   Patient Identification: Virginia Trujillo MRN:  [id number] Date of Evaluation:  04/08/2016 Referral Source: Dr. Phillips OdorGolding Chief Complaint:   Chief Complaint    Depression; Anxiety; Follow-up     Visit Diagnosis:    ICD-9-CM ICD-10-CM   1. Generalized anxiety disorder 300.02 F41.1     History of Present Illness: This patient is a 50 year old married black female who lives with her husband and 2 sons ages 3124 and 3121 in [place]. 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  The patient was referred by her primary care physician, Dr. Phillips OdorGolding, for further assessment and treatment of anxiety and depression.  The patient states that she's always been a somewhat anxious person. However her depression seriously worsened about 10 years ago. Her grandmother mother and father died all within a few months of each other. She was very close to all of these family members but particular to her mother. She became more depressed and anxious. She's had an increasingly difficult time working. She is to work in some sort of Designer, fashion/clothingclothing factory but became very anxious being around people. Sometimes she would just walk off the job and not be able to handle it. At this point she spends most of her time in her house. She can't go out without a family member going with her. She recently went on a cruise with her family and had a horrible time because she was so anxious. She is on Cymbalta 60 mg but doesn't take it consistently and I explained it will not work unless it's taken daily. She's never been on any other medications for anxiety but has tried Lexapro without success.  Currently the patient feels depressed and anxious. She is not really enjoying her life. She goes to church but is too nervous to participate much. She cries on an almost daily basis. She has chronic back pain. She complains of difficulty with short-term memory and cannot  remember things without her husband reminding her. She is extremely self-conscious and when she goes out she feels like people are watching her or judging her. Her sleep is poor and she only sleeps about 4 hours a night despite having sleep apnea and using a CPAP machine. She does not use drugs or alcohol and does not have auditory or visual hallucinations paranoia suicidal or homicidal ideation. She has panic attacks on a daily basis  The patient returns after 2 months. She states that she is generally doing a little bit better but is worried about her father who was recently diagnosed with prostate cancer. The Abilify is helped her and she is less paranoid at night. Most nights she is sleeping better but she did not sleep well last night. Her mood is been fairly stable and the Xanax continues to help her anxiety. Sometimes she only takes 2 a day but feels better if she takes 3 a day so I suggested she continue with this. He has been getting out a bit more with her family  Associated Signs/Symptoms: Depression Symptoms:  depressed mood, anhedonia, hypersomnia, psychomotor agitation, feelings of worthlessness/guilt, difficulty concentrating, anxiety, panic attacks, loss of energy/fatigue, disturbed sleep, (Hypo) Manic Symptoms:  Distractibility, Anxiety Symptoms:  Excessive Worry, Panic Symptoms, Social Anxiety,   Past Psychiatric History: none  Previous Psychotropic Medications: Yes   Substance Abuse History in the last 12 months:  No.  Consequences of Substance Abuse: NA  Past Medical History:  Past Medical  History:  Diagnosis Date  . Acid reflux   . Anxiety   . Depression   . Fibromyalgia     Past Surgical History:  Procedure Laterality Date  . CESAREAN SECTION    . MOUTH SURGERY    . tubes tied      Family Psychiatric History: The patient's sister also has a history depression and anxiety  Family History:  Family History  Problem Relation Age of Onset  .  Hypertension Father   . Hypertension Brother   . Diabetes Brother   . Depression Brother   . Anxiety disorder Brother   . Stroke Maternal Grandmother   . Stroke Paternal Grandfather   . Congestive Heart Failure Sister   . Diabetes Sister   . Hypertension Sister   . Anxiety disorder Sister   . Depression Sister   . Heart disease    . Arthritis    . Cancer    . Asthma    . Diabetes    . Kidney disease      Social History:   Social History   Social History  . Marital status: Married    Spouse name: N/A  . Number of children: 2  . Years of education: 12   Occupational History  . none Unemployed  . Unemployed     Social History Main Topics  . Smoking status: Never Smoker  . Smokeless tobacco: Never Used  . Alcohol use No  . Drug use: No  . Sexual activity: Yes    Birth control/ protection: Surgical   Other Topics Concern  . None   Social History Narrative   Patient lives at home with husband and son.    Patient does not work   Patient has a high school education    Patient has 2 children.    Additional Social History: The patient grew up with both parents and asked in [place]. She is the fifth of 11 children. She had a very good childhood and states that her family is extremely close. She did have learning disabilities and was in special ed but completed high school. She can read and write fairly well but states her memory is not good. She is married and her husband is very supportive and both of her sons are as well. 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 Prednisone Other (See Comments)    Depression and crying  . Ambien [Zolpidem Tartrate] Other (See Comments)    hallucinations  . Penicillins     Metabolic Disorder Labs: No results found for: HGBA1C, MPG No results found for: PROLACTIN No results found for: CHOL, TRIG,  HDL, CHOLHDL, VLDL, LDLCALC   Current Medications: Current Outpatient Prescriptions  Medication Sig Dispense Refill  . Acetaminophen (TYLENOL 8 HOUR PO) Take by mouth.      Marland Kitchen. albuterol (PROVENTIL) (2.5 MG/3ML) 0.083% nebulizer solution Take 2.5 mg by nebulization every 6 (six) hours as needed for wheezing or shortness of breath.    . ALPRAZolam (XANAX) 1 MG tablet Take 1 tablet (1 mg total) by mouth 3 (three) times daily as needed for anxiety. 90 tablet 2  . ARIPiprazole (ABILIFY) 2 MG tablet Take 1 tablet (2 mg total) by mouth at bedtime. 30 tablet 2  . baclofen (LIORESAL) 10 MG tablet Take 10 mg by mouth 3 (three) times daily.    .Marland Kitchen  Cholecalciferol (VITAMIN D PO) Take by mouth.    . doxycycline (VIBRAMYCIN) 100 MG capsule Take 100 mg by mouth 2 (two) times daily.    . DULoxetine (CYMBALTA) 60 MG capsule Take 1 capsule (60 mg total) by mouth 2 (two) times daily. 30 capsule 2  . gabapentin (NEURONTIN) 600 MG tablet daily  1  . meloxicam (MOBIC) 15 MG tablet Take 15 mg by mouth daily.      . montelukast (SINGULAIR) 10 MG tablet Take 10 mg by mouth at bedtime.    Marland Kitchen omeprazole (PRILOSEC) 40 MG capsule Take 1 capsule (40 mg total) by mouth daily. Appointment needed for further refills 30 capsule 3  . SUMAtriptan (IMITREX) 25 MG tablet Take 25 mg by mouth every 2 (two) hours as needed for migraine. May repeat in 2 hours if headache persists or recurs.    . TRAMADOL HCL PO Take by mouth.    . traZODone (DESYREL) 100 MG tablet Take 1 tablet (100 mg total) by mouth at bedtime. 30 tablet 2   Current Facility-Administered Medications  Medication Dose Route Frequency Provider Last Rate Last Dose  . methylPREDNISolone acetate (DEPO-MEDROL) injection 40 mg  40 mg Intra-articular Once Vickki Hearing, MD        Neurologic: Headache: No Seizure: No Paresthesias:No  Musculoskeletal: Strength & Muscle Tone: within normal limits Gait & Station: normal Patient leans: N/A  Psychiatric Specialty  Exam: Review of Systems  Musculoskeletal: Positive for back pain.  Psychiatric/Behavioral: Positive for depression and memory loss. The patient is nervous/anxious and has insomnia.   All other systems reviewed and are negative.   Blood pressure 129/76, pulse (!) 57, weight 260 lb (117.9 kg).Body mass index is 46.06 kg/m.  General Appearance: Casual, Neat and Well Groomed  Eye Contact:  Fair  Speech:  Clear and Coherent and Garbled  Volume:  Normal  Mood: Anxious   Affect:  Congruent   Thought Process:  Goal Directed  Orientation:  Full (Time, Place, and Person)  Thought Content:  Rumination  Suicidal Thoughts:  No  Homicidal Thoughts:  No  Memory:  Immediate;   Fair Recent;   Poor Remote;   Poor  Judgement:  Fair  Insight:  Lacking  Psychomotor Activity:  Normal  Concentration:  Concentration: Poor and Attention Span: Poor  Recall:  Poor  Fund of Knowledge:Fair  Language: Good  Akathisia:  No  Handed:  Right  AIMS (if indicated):    Assets:  Communication Skills Desire for Improvement Social Support  ADL's:  Intact  Cognition: WNL  Sleep:  poor    Treatment Plan Summary: Medication management   The patient will Continue Cymbalta to 60 mg twice a day for depression and trazodone 100 mg at bedtime for sleep. She'll continue Xanax And was reminded to take 1 mg 3 times a day. She'll also continue Abilify 2 mg at bedtime for auditory hallucinations and augmentation. She'll continue her counseling with Florencia Reasons and return to see me in 2 months   Iyona Pehrson, Gavin Pound, MD 12/5/201710:38 AM

## 2016-04-15 ENCOUNTER — Ambulatory Visit (HOSPITAL_COMMUNITY): Payer: Self-pay | Admitting: Psychiatry

## 2016-04-17 DIAGNOSIS — M47816 Spondylosis without myelopathy or radiculopathy, lumbar region: Secondary | ICD-10-CM | POA: Diagnosis not present

## 2016-04-17 DIAGNOSIS — M545 Low back pain: Secondary | ICD-10-CM | POA: Diagnosis not present

## 2016-04-17 DIAGNOSIS — M797 Fibromyalgia: Secondary | ICD-10-CM | POA: Diagnosis not present

## 2016-04-17 DIAGNOSIS — M5136 Other intervertebral disc degeneration, lumbar region: Secondary | ICD-10-CM | POA: Diagnosis not present

## 2016-04-21 ENCOUNTER — Ambulatory Visit (INDEPENDENT_AMBULATORY_CARE_PROVIDER_SITE_OTHER): Payer: BLUE CROSS/BLUE SHIELD | Admitting: Psychiatry

## 2016-04-21 ENCOUNTER — Encounter (HOSPITAL_COMMUNITY): Payer: Self-pay | Admitting: Psychiatry

## 2016-04-21 DIAGNOSIS — F411 Generalized anxiety disorder: Secondary | ICD-10-CM | POA: Diagnosis not present

## 2016-04-21 NOTE — Progress Notes (Signed)
Patient:  [place]ia Trujillo   DOB: 04/11/1966  MR Number: [id number]  Location[facility]:  [address] [place],  [place], [id number]  Start: Monday 04/21/2016 3:08 PM End: Monday 04/21/2016 3:54 PM      Provider/Observer:     Florencia ReasonsPeggy Brandan Glauber, MSW, LCSW   Chief Complaint:      Chief Complaint  Patient presents with  . Anxiety    Reason For Service:   Patient presents with symptoms of anxiety that have been lilfelong per patient's report but symptoms worsened when her mother died 15 years ago. Also during the same 2 month period around her mother's death,her grandparents and her sister died. Patient states being nervous and not wanting to be around people. She says she just feels safer when she locks the door and she sleeps with  the light on. She also reports having panic attacks every other day over simple things like things not going right. She fears been around people and worries something bad is going to happen. Current stressors include her father being diagnosed with prostate cancer about a month ago, her brother became blind this year due to diabetes and attempted suicide about 3 months ago,. Patient reports seeing a psyhiatrist once in early adolescence due to isolative behaviors and anxiety.  She recently began seeing psychiatrist Dr. Tenny Crawoss. She reports no previous involvement in outpatient therapy. Patient reports no psychiatric hospitalizations.      Interventions Strategy:  Supportive   Participation Level:   Active  Participation Quality:  Appropriate      Behavioral Observation:  Casual, Alert, and Constricted.   Current Psychosocial Factors: Father has prostate cancer,  grief and loss issues  Content of Session:    reviewed symptoms, facilitated expression of feelings, praised and reinforced patient's efforts to improve self-care, assisted patient identify strengths and supports, developed treatment plan, praised and reinforced patient's use of controlled  breathing, assisted patient identify ways to use support system  Current Status:   Decreased isolative behaviors, increased panic attacks, nervousness, sleep difficulty, memory difficulty, decreased worry, decreased  crying spells, depressedmood  Suicidal/Homicidal:   No  Patient Progress:   Fair. Patient reports increased sadness and panic attacks. She has been experiencing increased grief and loss issues with the upcoming holidays. She reports increased thoughts about deceased mother and sister since less session. She reports being really down and depressed for about 2 days but eventually feeling better with support from husband. He took her out for a ride but patient reported continued tendency not to want to be around people. However she did talk to sister about their mother which helped. Patient is planning to celebrate Christmas with immediate and extended family.   Target Goals:   1.  Learn and implement calming skills to manage and reduce overall anxiety.     2. Identify, challenge, and replace biased, fearful self talk with positive, realistic, and empowering self talk.  Last Reviewed:   04/21/2016  Goals Addressed Today:    1,2,  Plan:      Return in 2 weeks  Impression/Diagnosis:   Patient presents with symptoms of anxiety that have been lilfelong per patient's report but symptoms worsened when her mother died 15 years ago. Also during the same 2 month period around her mother's death,her grandparents and her sister died. Current symptoms include isolative behaviors, panic attacks, nervousness, sleep difficulty, memory difficulty, persistent worry, daily crying spells, depressed mood  Diagnosis:  Axis I: Generalized anxiety disorder  Axis II: Deferred   [place] Yount, LCSW 04/21/2016

## 2016-05-01 DIAGNOSIS — M545 Low back pain: Secondary | ICD-10-CM | POA: Diagnosis not present

## 2016-05-02 ENCOUNTER — Encounter (HOSPITAL_COMMUNITY): Payer: Self-pay | Admitting: Psychiatry

## 2016-05-02 ENCOUNTER — Encounter (INDEPENDENT_AMBULATORY_CARE_PROVIDER_SITE_OTHER): Payer: Self-pay

## 2016-05-02 ENCOUNTER — Ambulatory Visit (INDEPENDENT_AMBULATORY_CARE_PROVIDER_SITE_OTHER): Payer: BLUE CROSS/BLUE SHIELD | Admitting: Psychiatry

## 2016-05-02 DIAGNOSIS — F411 Generalized anxiety disorder: Secondary | ICD-10-CM | POA: Diagnosis not present

## 2016-05-02 NOTE — Progress Notes (Signed)
Patient:  Virginia Trujillo   DOB: 01/04/1966  MR Number: [id number]  Location: [facility]:  [address] [place],  [place], [id number]  Start: Friday 05/02/2016 10:07 AM End: Friday 12/292017  10:56 AM   Provider/Observer:     Florencia ReasonsPeggy Arnella Pralle, MSW, LCSW   Chief Complaint:     Anxiety   Reason For Service:   Patient presents with symptoms of anxiety that have been lilfelong per patient's report but symptoms worsened when her mother died 15 years ago. Also during the same 2 month period around her mother's death,her grandparents and her sister died. Patient states being nervous and not wanting to be around people. She says she just feels safer when she locks the door and she sleeps with  the light on. She also reports having panic attacks every other day over simple things like things not going right. She fears been around people and worries something bad is going to happen. Current stressors include her father being diagnosed with prostate cancer about a month ago, her brother became blind this year due to diabetes and attempted suicide about 3 months ago,. Patient reports seeing a psyhiatrist once in early adolescence due to isolative behaviors and anxiety.  She recently began seeing psychiatrist Dr. Tenny Crawoss. She reports no previous involvement in outpatient therapy. Patient reports no psychiatric hospitalizations.       Interventions Strategy:  Supportive   Participation Level:   Active  Participation Quality:  Appropriate      Behavioral Observation:  Casual, Alert, and Constricted.   Current Psychosocial Factors: Father has prostate cancer,  grief and loss issues  Content of Session:    reviewed symptoms, facilitated expression of feelings, processed grief and loss issues, assisted patient identify thoughts that trigger anxiety, introduced and explained thought stopping technique  Current Status:   Decreased isolative behaviors, continued panic attacks, nervousness, sleep  difficulty, memory difficulty, worry, decreased crying spells,  Suicidal/Homicidal:   No  Patient Progress:   Fair. Patient reports increased thoughts about deceased loved ones during the holidays. She reports having 2 panic attacks since last session and attributes one of these to recently seeing her deceased sister's husband. She reports this triggered memories of how he mistreated her sister. She continues to worry about a variety of issues.    Target Goals:   1.  Learn and implement calming skills to manage and reduce overall anxiety.     2. Identify, challenge, and replace biased, fearful self talk with positive, realistic, and empowering self talk.  Last Reviewed:   04/21/2016  Goals Addressed Today:    1,2,  Plan:      Return in 2 weeks  Impression/Diagnosis:   Patient presents with symptoms of anxiety that have been lilfelong per patient's report but symptoms worsened when her mother died 15 years ago. Also during the same 2 month period around her mother's death,her grandparents and her sister died. Current symptoms include isolative behaviors, panic attacks, nervousness, sleep difficulty, memory difficulty, persistent worry, daily crying spells, depressed mood  Diagnosis:  Axis I: Generalized anxiety disorder          Axis II: Deferred   Janeann Paisley, LCSW 05/02/2016

## 2016-05-23 ENCOUNTER — Ambulatory Visit (HOSPITAL_COMMUNITY): Payer: Self-pay | Admitting: Psychiatry

## 2016-06-01 DIAGNOSIS — M545 Low back pain: Secondary | ICD-10-CM | POA: Diagnosis not present

## 2016-06-02 DIAGNOSIS — Z79899 Other long term (current) drug therapy: Secondary | ICD-10-CM | POA: Diagnosis not present

## 2016-06-02 DIAGNOSIS — R51 Headache: Secondary | ICD-10-CM | POA: Diagnosis not present

## 2016-06-02 DIAGNOSIS — R0602 Shortness of breath: Secondary | ICD-10-CM | POA: Diagnosis not present

## 2016-06-02 DIAGNOSIS — K219 Gastro-esophageal reflux disease without esophagitis: Secondary | ICD-10-CM | POA: Diagnosis not present

## 2016-06-02 DIAGNOSIS — J189 Pneumonia, unspecified organism: Secondary | ICD-10-CM | POA: Diagnosis not present

## 2016-06-02 DIAGNOSIS — M797 Fibromyalgia: Secondary | ICD-10-CM | POA: Diagnosis not present

## 2016-06-05 DIAGNOSIS — M797 Fibromyalgia: Secondary | ICD-10-CM | POA: Diagnosis not present

## 2016-06-05 DIAGNOSIS — K219 Gastro-esophageal reflux disease without esophagitis: Secondary | ICD-10-CM | POA: Diagnosis not present

## 2016-06-05 DIAGNOSIS — Z79899 Other long term (current) drug therapy: Secondary | ICD-10-CM | POA: Diagnosis not present

## 2016-06-05 DIAGNOSIS — J1089 Influenza due to other identified influenza virus with other manifestations: Secondary | ICD-10-CM | POA: Diagnosis not present

## 2016-06-05 DIAGNOSIS — J101 Influenza due to other identified influenza virus with other respiratory manifestations: Secondary | ICD-10-CM | POA: Diagnosis not present

## 2016-06-05 DIAGNOSIS — R0602 Shortness of breath: Secondary | ICD-10-CM | POA: Diagnosis not present

## 2016-06-05 DIAGNOSIS — Z8701 Personal history of pneumonia (recurrent): Secondary | ICD-10-CM | POA: Diagnosis not present

## 2016-06-05 DIAGNOSIS — E669 Obesity, unspecified: Secondary | ICD-10-CM | POA: Diagnosis not present

## 2016-06-05 DIAGNOSIS — R112 Nausea with vomiting, unspecified: Secondary | ICD-10-CM | POA: Diagnosis not present

## 2016-06-06 ENCOUNTER — Ambulatory Visit (HOSPITAL_COMMUNITY): Payer: Self-pay | Admitting: Psychiatry

## 2016-06-09 ENCOUNTER — Encounter (HOSPITAL_COMMUNITY): Payer: Self-pay | Admitting: Psychiatry

## 2016-06-09 ENCOUNTER — Ambulatory Visit (INDEPENDENT_AMBULATORY_CARE_PROVIDER_SITE_OTHER): Payer: BLUE CROSS/BLUE SHIELD | Admitting: Psychiatry

## 2016-06-09 VITALS — BP 148/86 | HR 74 | Ht 63.0 in | Wt 256.4 lb

## 2016-06-09 DIAGNOSIS — Z880 Allergy status to penicillin: Secondary | ICD-10-CM

## 2016-06-09 DIAGNOSIS — Z841 Family history of disorders of kidney and ureter: Secondary | ICD-10-CM

## 2016-06-09 DIAGNOSIS — Z79899 Other long term (current) drug therapy: Secondary | ICD-10-CM

## 2016-06-09 DIAGNOSIS — Z888 Allergy status to other drugs, medicaments and biological substances status: Secondary | ICD-10-CM

## 2016-06-09 DIAGNOSIS — Z8249 Family history of ischemic heart disease and other diseases of the circulatory system: Secondary | ICD-10-CM

## 2016-06-09 DIAGNOSIS — Z818 Family history of other mental and behavioral disorders: Secondary | ICD-10-CM

## 2016-06-09 DIAGNOSIS — Z823 Family history of stroke: Secondary | ICD-10-CM

## 2016-06-09 DIAGNOSIS — Z8489 Family history of other specified conditions: Secondary | ICD-10-CM

## 2016-06-09 DIAGNOSIS — Z833 Family history of diabetes mellitus: Secondary | ICD-10-CM | POA: Diagnosis not present

## 2016-06-09 DIAGNOSIS — Z9889 Other specified postprocedural states: Secondary | ICD-10-CM | POA: Diagnosis not present

## 2016-06-09 DIAGNOSIS — F411 Generalized anxiety disorder: Secondary | ICD-10-CM

## 2016-06-09 DIAGNOSIS — Z825 Family history of asthma and other chronic lower respiratory diseases: Secondary | ICD-10-CM

## 2016-06-09 MED ORDER — ALPRAZOLAM 1 MG PO TABS: 1.0000 mg | ORAL_TABLET | Freq: Three times a day (TID) | ORAL | 2 refills | Status: DC | PRN

## 2016-06-09 MED ORDER — DULOXETINE HCL 60 MG PO CPEP
60.0000 mg | ORAL_CAPSULE | Freq: Two times a day (BID) | ORAL | 2 refills | Status: DC
Start: 2016-06-09 — End: 2016-09-02

## 2016-06-09 MED ORDER — ARIPIPRAZOLE 2 MG PO TABS: 2.0000 mg | ORAL_TABLET | Freq: Every day | ORAL | 2 refills | Status: DC

## 2016-06-09 NOTE — Progress Notes (Signed)
Psychiatric Initial Adult Assessment   Patient Identification: Virginia Trujillo MRN:  [id number] Date of Evaluation:  06/09/2016 Referral Source: Dr. Phillips Odor Chief Complaint:   Chief Complaint    Depression; Anxiety; Follow-up     Visit Diagnosis:    ICD-9-CM ICD-10-CM   1. Generalized anxiety disorder 300.02 F41.1     History of Present Illness: This patient is a 51 year old married black female who lives with her husband and 2 sons ages 67 and 62 in [place]. 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  The patient was referred by her primary care physician, Dr. Phillips Odor, for further assessment and treatment of anxiety and depression.  The patient states that she's always been a somewhat anxious person. However her depression seriously worsened about 10 years ago. Her grandmother mother and father died all within a few months of each other. She was very close to all of these family members but particular to her mother. She became more depressed and anxious. She's had an increasingly difficult time working. She is to work in some sort of Designer, fashion/clothing but became very anxious being around people. Sometimes she would just walk off the job and not be able to handle it. At this point she spends most of her time in her house. She can't go out without a family member going with her. She recently went on a cruise with her family and had a horrible time because she was so anxious. She is on Cymbalta 60 mg but doesn't take it consistently and I explained it will not work unless it's taken daily. She's never been on any other medications for anxiety but has tried Lexapro without success.  Currently the patient feels depressed and anxious. She is not really enjoying her life. She goes to church but is too nervous to participate much. She cries on an almost daily basis. She has chronic back pain. She complains of difficulty with short-term memory and cannot  remember things without her husband reminding her. She is extremely self-conscious and when she goes out she feels like people are watching her or judging her. Her sleep is poor and she only sleeps about 4 hours a night despite having sleep apnea and using a CPAP machine. She does not use drugs or alcohol and does not have auditory or visual hallucinations paranoia suicidal or homicidal ideation. She has panic attacks on a daily basis  The patient returns after 2 months. She states that she is generally doing better but had the flu 2 weeks ago and felt very sick and anxious. She was placed on prednisone which may have had something to do with the increased anxiety. She is doing better now. She states however before she got sick she was doing really well in terms of her mood and anxiety. She feels that the medicine she's getting here helping and she is sleeping much better as well particularly with the Abilify. She denies any recent panic attacks  Associated Signs/Symptoms: Depression Symptoms:  depressed mood, anhedonia, hypersomnia, psychomotor agitation, feelings of worthlessness/guilt, difficulty concentrating, anxiety, panic attacks, loss of energy/fatigue, disturbed sleep, (Hypo) Manic Symptoms:  Distractibility, Anxiety Symptoms:  Excessive Worry, Panic Symptoms, Social Anxiety,   Past Psychiatric History: none  Previous Psychotropic Medications: Yes   Substance Abuse History in the last 12 months:  No.  Consequences of Substance Abuse: NA  Past Medical History:  Past Medical History:  Diagnosis Date  . Acid reflux   .  Anxiety   . Depression   . Fibromyalgia     Past Surgical History:  Procedure Laterality Date  . CESAREAN SECTION    . MOUTH SURGERY    . tubes tied      Family Psychiatric History: The patient's sister also has a history depression and anxiety  Family History:  Family History  Problem Relation Age of Onset  . Hypertension Father   .  Hypertension Brother   . Diabetes Brother   . Depression Brother   . Anxiety disorder Brother   . Stroke Maternal Grandmother   . Stroke Paternal Grandfather   . Congestive Heart Failure Sister   . Diabetes Sister   . Hypertension Sister   . Anxiety disorder Sister   . Depression Sister   . Heart disease    . Arthritis    . Cancer    . Asthma    . Diabetes    . Kidney disease      Social History:   Social History   Social History  . Marital status: Married    Spouse name: N/A  . Number of children: 2  . Years of education: 12   Occupational History  . none Unemployed  . Unemployed     Social History Main Topics  . Smoking status: Never Smoker  . Smokeless tobacco: Never Used  . Alcohol use No  . Drug use: No  . Sexual activity: Yes    Birth control/ protection: Surgical   Other Topics Concern  . None   Social History Narrative   Patient lives at home with husband and son.    Patient does not work   Patient has a high school education    Patient has 2 children.    Additional Social History: The patient grew up with both parents and asked in [place]. She is the fifth of 11 children. She had a very good childhood and states that her family is extremely close. She did have learning disabilities and was in special ed but completed high school. She can read and write fairly well but states her memory is not good. She is married and her husband is very supportive and both of her sons are as well. 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 Prednisone Other (See Comments)    Depression and crying  . Ambien [Zolpidem Tartrate] Other (See Comments)    hallucinations  . Penicillins     Metabolic Disorder Labs: No results found for: HGBA1C, MPG No results found for: PROLACTIN No results found for: CHOL, TRIG, HDL, CHOLHDL, VLDL,  LDLCALC   Current Medications: Current Outpatient Prescriptions  Medication Sig Dispense Refill  . Acetaminophen (TYLENOL 8 HOUR PO) Take by mouth.      Marland Kitchen. albuterol (PROVENTIL) (2.5 MG/3ML) 0.083% nebulizer solution Take 2.5 mg by nebulization every 6 (six) hours as needed for wheezing or shortness of breath.    . ALPRAZolam (XANAX) 1 MG tablet Take 1 tablet (1 mg total) by mouth 3 (three) times daily as needed for anxiety. 90 tablet 2  . ARIPiprazole (ABILIFY) 2 MG tablet Take 1 tablet (2 mg total) by mouth at bedtime. 30 tablet 2  . baclofen (LIORESAL) 10 MG tablet Take 10 mg by mouth 3 (three) times daily.    . Cholecalciferol (VITAMIN D PO) Take by mouth.    .Marland Kitchen  doxycycline (VIBRAMYCIN) 100 MG capsule Take 100 mg by mouth 2 (two) times daily.    . DULoxetine (CYMBALTA) 60 MG capsule Take 1 capsule (60 mg total) by mouth 2 (two) times daily. 30 capsule 2  . gabapentin (NEURONTIN) 600 MG tablet daily  1  . meloxicam (MOBIC) 15 MG tablet Take 15 mg by mouth daily.      . montelukast (SINGULAIR) 10 MG tablet Take 10 mg by mouth at bedtime.    Marland Kitchen omeprazole (PRILOSEC) 40 MG capsule Take 1 capsule (40 mg total) by mouth daily. Appointment needed for further refills 30 capsule 3  . SUMAtriptan (IMITREX) 25 MG tablet Take 25 mg by mouth every 2 (two) hours as needed for migraine. May repeat in 2 hours if headache persists or recurs.    . TRAMADOL HCL PO Take by mouth.    . traZODone (DESYREL) 100 MG tablet Take 1 tablet (100 mg total) by mouth at bedtime. 30 tablet 2   Current Facility-Administered Medications  Medication Dose Route Frequency Provider Last Rate Last Dose  . methylPREDNISolone acetate (DEPO-MEDROL) injection 40 mg  40 mg Intra-articular Once Vickki Hearing, MD        Neurologic: Headache: No Seizure: No Paresthesias:No  Musculoskeletal: Strength & Muscle Tone: within normal limits Gait & Station: normal Patient leans: N/A  Psychiatric Specialty Exam: Review of  Systems  Musculoskeletal: Positive for back pain.  Psychiatric/Behavioral: Positive for depression and memory loss. The patient is nervous/anxious and has insomnia.   All other systems reviewed and are negative.   Blood pressure (!) 148/86, pulse 74, height 5\' 3"  (1.6 m), weight 256 lb 6.4 oz (116.3 kg).Body mass index is 45.42 kg/m.  General Appearance: Casual, Neat and Well Groomed  Eye Contact:  Fair  Speech:  Clear and Coherent and Garbled  Volume:  Normal  Mood: Good   Affect:  Bright   Thought Process:  Goal Directed  Orientation:  Full (Time, Place, and Person)  Thought Content:  Rumination  Suicidal Thoughts:  No  Homicidal Thoughts:  No  Memory:  Immediate;   Fair Recent;   Poor Remote;   Poor  Judgement:  Fair  Insight:  Lacking  Psychomotor Activity:  Normal  Concentration:  Concentration: Poor and Attention Span: Poor  Recall:  Poor  Fund of Knowledge:Fair  Language: Good  Akathisia:  No  Handed:  Right  AIMS (if indicated):    Assets:  Communication Skills Desire for Improvement Social Support  ADL's:  Intact  Cognition: WNL  Sleep:  poor    Treatment Plan Summary: Medication management   The patient will Continue Cymbalta to 60 mg twice a day for depression and trazodone 100 mg at bedtime for sleep. She'll continue Xanax And was reminded to take 1 mg 3 times a day. She'll also continue Abilify 2 mg at bedtime for auditory hallucinations and augmentation. She'll continue her counseling with Florencia Reasons and return to see me in 3 months   Diannia Ruder, MD 2/5/201810:16 AM

## 2016-06-11 DIAGNOSIS — Z6841 Body Mass Index (BMI) 40.0 and over, adult: Secondary | ICD-10-CM | POA: Diagnosis not present

## 2016-06-11 DIAGNOSIS — J111 Influenza due to unidentified influenza virus with other respiratory manifestations: Secondary | ICD-10-CM | POA: Diagnosis not present

## 2016-06-11 DIAGNOSIS — J189 Pneumonia, unspecified organism: Secondary | ICD-10-CM | POA: Diagnosis not present

## 2016-06-11 DIAGNOSIS — Z1389 Encounter for screening for other disorder: Secondary | ICD-10-CM | POA: Diagnosis not present

## 2016-06-11 DIAGNOSIS — M797 Fibromyalgia: Secondary | ICD-10-CM | POA: Diagnosis not present

## 2016-06-19 DIAGNOSIS — M5136 Other intervertebral disc degeneration, lumbar region: Secondary | ICD-10-CM | POA: Diagnosis not present

## 2016-06-19 DIAGNOSIS — M545 Low back pain: Secondary | ICD-10-CM | POA: Diagnosis not present

## 2016-06-19 DIAGNOSIS — M47816 Spondylosis without myelopathy or radiculopathy, lumbar region: Secondary | ICD-10-CM | POA: Diagnosis not present

## 2016-06-20 DIAGNOSIS — G4733 Obstructive sleep apnea (adult) (pediatric): Secondary | ICD-10-CM | POA: Diagnosis not present

## 2016-06-25 ENCOUNTER — Ambulatory Visit (HOSPITAL_COMMUNITY): Payer: Self-pay | Admitting: Psychiatry

## 2016-07-02 DIAGNOSIS — M545 Low back pain: Secondary | ICD-10-CM | POA: Diagnosis not present

## 2016-07-09 ENCOUNTER — Other Ambulatory Visit (HOSPITAL_COMMUNITY): Payer: Self-pay | Admitting: Psychiatry

## 2016-07-10 ENCOUNTER — Encounter (HOSPITAL_COMMUNITY): Payer: Self-pay | Admitting: Psychiatry

## 2016-07-10 ENCOUNTER — Ambulatory Visit (INDEPENDENT_AMBULATORY_CARE_PROVIDER_SITE_OTHER): Payer: BLUE CROSS/BLUE SHIELD | Admitting: Psychiatry

## 2016-07-10 DIAGNOSIS — F411 Generalized anxiety disorder: Secondary | ICD-10-CM | POA: Diagnosis not present

## 2016-07-10 NOTE — Progress Notes (Addendum)
Patient:  [place] Trujillo   DOB: 04/02/1966  MR Number: [id number]  Location[facility]:  [address] [place],  [place], [id number]  Start: Thursday 07/10/2016 2:07 PM    End: Thursday 07/10/2016 2:59 PM        Provider/Observer:     Florencia ReasonsPeggy Korri Ask, MSW, LCSW   Chief Complaint:     Anxiety   Reason For Service:   Patient presents with symptoms of anxiety that have been lilfelong per patient's report but symptoms worsened when her mother died 15 years ago. Also during the same 2 month period around her mother's death,her grandparents and her sister died. Patient states being nervous and not wanting to be around people. She says she just feels safer when she locks the door and she sleeps with  the light on. She also reports having panic attacks every other day over simple things like things not going right. She fears been around people and worries something bad is going to happen. Current stressors include her father being diagnosed with prostate cancer about a month ago, her brother became blind this year due to diabetes and attempted suicide about 3 months ago,. Patient reports seeing a psyhiatrist once in early adolescence due to isolative behaviors and anxiety.  She recently began seeing psychiatrist Dr. Tenny Crawoss. She reports no previous involvement in outpatient therapy. Patient reports no psychiatric hospitalizations.       Interventions Strategy:  Supportive   Participation Level:   Active  Participation Quality:  Appropriate      Behavioral Observation:  Casual, Alert, and Constricted.   Current Psychosocial Factors: Father has prostate cancer,   Content of Session:    reviewed symptoms, facilitated expression of feelings, assisted patient identify recent triggers of anxiety, dicussed/praised/reinforced patient's use of thought stopping technique and use of controlled breathing, discussed rationale for and  practiced a mindfulness technique to increase awareness, Current  Status:   Decreased isolative behaviors, decreased panic attacks, decreased nervousness, sleep difficulty, memory difficulty, worry, decreased crying spells,  Suicidal/Homicidal:   No  Patient Progress:   Good. Patient reports improvement overall in managing anxiety. She reports having two panic attacks since she last was seen in December 2017. She reports experiencing increased symptoms of anxiety during a 2 week period when she had pneumonia/flu and was prescribed prednisone. She also has been experiencing difficulty falling asleep since that time. She has been using thought stopping technique successfully and shares 3 examples in session. She recently learned her youngest son;s girlfriend is pregnant.   Target Goals:   1.  Learn and implement calming skills to manage and reduce overall anxiety.     2. Identify, challenge, and replace biased, fearful self talk with positive, realistic, and empowering self talk.  Last Reviewed:   04/21/2016  Goals Addressed Today:    1,2,  Plan:      Return in 2 weeks  Impression/Diagnosis:   Patient presents with symptoms of anxiety that have been lilfelong per patient's report but symptoms worsened when her mother died 15 years ago. Also during the same 2 month period around her mother's death,her grandparents and her sister died. Symptoms include isolative behaviors, panic attacks, nervousness, sleep difficulty, memory difficulty, persistent worry, daily crying spells, depressed mood  Diagnosis:  Axis I: Generalized anxiety disorder          Axis II: Deferred   Dorrene Bently, LCSW 07/10/2016

## 2016-07-30 DIAGNOSIS — M545 Low back pain: Secondary | ICD-10-CM | POA: Diagnosis not present

## 2016-07-31 ENCOUNTER — Encounter (HOSPITAL_COMMUNITY): Payer: Self-pay | Admitting: Psychiatry

## 2016-07-31 ENCOUNTER — Ambulatory Visit (INDEPENDENT_AMBULATORY_CARE_PROVIDER_SITE_OTHER): Payer: BLUE CROSS/BLUE SHIELD | Admitting: Psychiatry

## 2016-07-31 DIAGNOSIS — F411 Generalized anxiety disorder: Secondary | ICD-10-CM | POA: Diagnosis not present

## 2016-07-31 NOTE — Progress Notes (Signed)
Patient:  [place]ia Trujillo   DOB: 02/04/1966  MR Number: [id number]  Location: [facility]:  [address] [place],  [place], [id number]  Start: Thursday 07/31/2016 10:58 AM    End: Thursday 07/31/2016 11:58 AM    Provider/Observer:     Florencia ReasonsPeggy Bynum, MSW, LCSW   Chief Complaint:     Anxiety   Reason For Service:   Patient presents with symptoms of anxiety that have been lilfelong per patient's report but symptoms worsened when her mother died 15 years ago. Also during the same 2 month period around her mother's death,her grandparents and her sister died. Patient states being nervous and not wanting to be around people. She says she just feels safer when she locks the door and she sleeps with  the light on. She also reports having panic attacks every other day over simple things like things not going right. She fears been around people and worries something bad is going to happen. Current stressors include her father being diagnosed with prostate cancer about a month ago, her brother became blind this year due to diabetes and attempted suicide about 3 months ago,. Patient reports seeing a psyhiatrist once in early adolescence due to isolative behaviors and anxiety.  She recently began seeing psychiatrist Dr. Tenny Crawoss. She reports no previous involvement in outpatient therapy. Patient reports no psychiatric hospitalizations.       Interventions Strategy:  Supportive   Participation Level:   Active  Participation Quality:  Appropriate      Behavioral Observation:  Casual, Alert, and Constricted.   Current Psychosocial Factors: Father has prostate cancer,   Content of Session:    reviewed symptoms, facilitated expression of feelings, assisted patient identify recent triggers of anxiety, discussed connection between thought/mood/ behavior using examples from patient's life, assisted patient identify, challenge, and replace negative/fearful thoughts about patient's brother's situation with  realistic healthy alternatives, introduced anxiety log, discussed rationale for keeping log and practiced completing log, assigned patient to keep log and bring to next session, reviewed treatment plan.      Current Status:   Increased isolative behaviors,  panic attacks, increasednervousness, sleep difficulty, memory difficulty, worry, decreased crying spells,  Suicidal/Homicidal:   No  Patient Progress:   Fair. Patient reports increased stress, anxiety, sleep difficulty in the past two weeks. She says April and May normally are difficult months for her as it triggers memories of deceased  mother and sister. She reports this triggers worries about her brother and other family members. She worries about their welfare and how they are coping although they seem be to managing fairly well.  Target Goals:   1.  Learn and implement calming skills to manage and reduce overall anxiety.      2. Identify, challenge, and replace biased, fearful self talk with positive, realistic, and empowering self talk.  Last Reviewed:   07/31/2016  Goals Addressed Today:    1,2,  Plan:      Return in 2 weeks  Impression/Diagnosis:   Patient presents with symptoms of anxiety that have been lilfelong per patient's report but symptoms worsened when her mother died 15 years ago. Also during the same 2 month period around her mother's death,her grandparents and her sister died. Symptoms include isolative behaviors, panic attacks, nervousness, sleep difficulty, memory difficulty, persistent worry, daily crying spells, depressed mood  Diagnosis:  Axis I: Generalized anxiety disorder          Axis II: Deferred   BYNUM,PEGGY, LCSW 07/31/2016

## 2016-08-07 ENCOUNTER — Other Ambulatory Visit (HOSPITAL_COMMUNITY): Payer: Self-pay | Admitting: Psychiatry

## 2016-08-15 ENCOUNTER — Encounter (HOSPITAL_COMMUNITY): Payer: Self-pay | Admitting: Psychiatry

## 2016-08-15 ENCOUNTER — Ambulatory Visit (INDEPENDENT_AMBULATORY_CARE_PROVIDER_SITE_OTHER): Payer: BLUE CROSS/BLUE SHIELD | Admitting: Psychiatry

## 2016-08-15 DIAGNOSIS — F411 Generalized anxiety disorder: Secondary | ICD-10-CM | POA: Diagnosis not present

## 2016-08-15 NOTE — Progress Notes (Signed)
Patient:  Virginia Trujillo   DOB: Dec 27, 1965  MR Number: [id number]  Location: [facility]:  [address] [place],  [place], [address]    Start: Friday 08/15/2016 9:17 AM    End: Friday 08/15/2016 10:10 AM   Provider/Observer:     Florencia Reasons, MSW, LCSW   Chief Complaint:     Anxiety   Reason For Service:   Patient presents with symptoms of anxiety that have been lilfelong per patient's report but symptoms worsened when her mother died 15 years ago. Also during the same 2 month period around her mother's death,her grandparents and her sister died. Patient states being nervous and not wanting to be around people. She says she just feels safer when she locks the door and she sleeps with  the light on. She also reports having panic attacks every other day over simple things like things not going right. She fears been around people and worries something bad is going to happen. Current stressors include her father being diagnosed with prostate cancer about a month ago, her brother became blind this year due to diabetes and attempted suicide about 3 months ago,. Patient reports seeing a psyhiatrist once in early adolescence due to isolative behaviors and anxiety.  She recently began seeing psychiatrist Dr. Tenny Craw. She reports no previous involvement in outpatient therapy. Patient reports no psychiatric hospitalizations.       Interventions Strategy:  Supportive   Participation Level:   Active  Participation Quality:  Appropriate      Behavioral Observation:  Casual, Alert, and Constricted.   Current Psychosocial Factors: Father has prostate cancer, brother was hospitalized emergently last week   Content of Session:    reviewed symptoms, facilitated expression of feelings, praised patient's completion of homework assignment (using anxiety log),  discussed effects of using log, assisted patient identify thought patterns from log, reviewed connection between thought/mood/ behavior using  examples from patient's anxiety log, provided psychoeducation regarding anxiety (symptoms, types, how anxiety grows, and anxiety treatments), discussed patient's prevalent cognitive distortion (catastrophizing), introduced/discussed/ and practiced steps to decatastrophizing using handout, assigned patient to use handout between sessions to address worry thoughts and bring completed forms to next session, reviewed rationale for practicing controlled breathing and mindful breathing techniques, assigned patient to practice a technique daily     Current Status:   decreased isolative behaviors, decreased panic attacks, decreased worry, continue headaches and continued  sleep difficulty,  Suicidal/Homicidal:   No  Patient Progress:   Good. Patient reports increased stress due to brother becoming sick and having to be hospitalized. Per her report, he had an adverse reaction to a medication and became agitated/disoriented. Her father and sister called her to calm him down and to convince him to go ER. Patient reports being anxious but being able to assist brother. She used support from husband, mindful breathing technique, and positive self-talk to cope. She is pleased that she did not shut down like she normally would have done in the past. She has been using anxiety log and reports this was helpful as it allowed her to write out her worries and talk about them rather than avoid talking about them.   Target Goals:   1.  Learn and implement calming skills to manage and reduce overall anxiety.      2. Identify, challenge, and replace biased, fearful self talk with positive, realistic, and empowering self talk.  Last Reviewed:   07/31/2016  Goals Addressed Today:    1,2,  Plan:  Return in 2 weeks  Impression/Diagnosis:   Patient presents with symptoms of anxiety that have been lilfelong per patient's report but symptoms worsened when her mother died 15 years ago. Also during the same 2 month period around  her mother's death,her grandparents and her sister died. Symptoms include isolative behaviors, panic attacks, nervousness, sleep difficulty, memory difficulty, persistent worry, daily crying spells, depressed mood  Diagnosis:  Axis I: Generalized anxiety disorder          Axis II: Deferred   Demitrus Francisco, LCSW 08/15/2016

## 2016-08-19 DIAGNOSIS — Z1389 Encounter for screening for other disorder: Secondary | ICD-10-CM | POA: Diagnosis not present

## 2016-08-19 DIAGNOSIS — Z6841 Body Mass Index (BMI) 40.0 and over, adult: Secondary | ICD-10-CM | POA: Diagnosis not present

## 2016-08-19 DIAGNOSIS — J302 Other seasonal allergic rhinitis: Secondary | ICD-10-CM | POA: Diagnosis not present

## 2016-08-19 DIAGNOSIS — J0100 Acute maxillary sinusitis, unspecified: Secondary | ICD-10-CM | POA: Diagnosis not present

## 2016-08-29 ENCOUNTER — Ambulatory Visit (HOSPITAL_COMMUNITY): Payer: Self-pay | Admitting: Psychiatry

## 2016-08-30 DIAGNOSIS — M545 Low back pain: Secondary | ICD-10-CM | POA: Diagnosis not present

## 2016-09-02 ENCOUNTER — Ambulatory Visit (INDEPENDENT_AMBULATORY_CARE_PROVIDER_SITE_OTHER): Payer: BLUE CROSS/BLUE SHIELD | Admitting: Psychiatry

## 2016-09-02 ENCOUNTER — Encounter (HOSPITAL_COMMUNITY): Payer: Self-pay | Admitting: Psychiatry

## 2016-09-02 VITALS — BP 139/91 | HR 80 | Ht 63.0 in | Wt 265.0 lb

## 2016-09-02 DIAGNOSIS — Z79899 Other long term (current) drug therapy: Secondary | ICD-10-CM

## 2016-09-02 DIAGNOSIS — F411 Generalized anxiety disorder: Secondary | ICD-10-CM

## 2016-09-02 DIAGNOSIS — Z818 Family history of other mental and behavioral disorders: Secondary | ICD-10-CM

## 2016-09-02 MED ORDER — DULOXETINE HCL 60 MG PO CPEP: 60.0000 mg | ORAL_CAPSULE | Freq: Two times a day (BID) | ORAL | 2 refills | Status: DC

## 2016-09-02 MED ORDER — ALPRAZOLAM 1 MG PO TABS: 1.0000 mg | ORAL_TABLET | Freq: Three times a day (TID) | ORAL | 2 refills | Status: DC | PRN

## 2016-09-02 MED ORDER — ARIPIPRAZOLE 2 MG PO TABS: 2.0000 mg | ORAL_TABLET | Freq: Every day | ORAL | 2 refills | Status: DC

## 2016-09-02 MED ORDER — TRAZODONE HCL 100 MG PO TABS: 100.0000 mg | ORAL_TABLET | Freq: Every day | ORAL | 2 refills | Status: DC

## 2016-09-02 NOTE — Progress Notes (Signed)
Psychiatric Initial Adult Assessment   Patient Identification: Virginia Trujillo MRN:  [id number] Date of Evaluation:  09/02/2016 Referral Source: Dr. Phillips Odor Chief Complaint:   Chief Complaint    Depression; Anxiety; Follow-up     Visit Diagnosis:    ICD-9-CM ICD-10-CM   1. Generalized anxiety disorder 300.02 F41.1     History of Present Illness: This patient is a 51 year old married black female who lives with her husband and 2 sons ages 45 and 37 in [place]. 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  The patient was referred by her primary care physician, Dr. Phillips Odor, for further assessment and treatment of anxiety and depression.  The patient states that she's always been a somewhat anxious person. However her depression seriously worsened about 10 years ago. Her grandmother mother and father died all within a few months of each other. She was very close to all of these family members but particular to her mother. She became more depressed and anxious. She's had an increasingly difficult time working. She is to work in some sort of Designer, fashion/clothing but became very anxious being around people. Sometimes she would just walk off the job and not be able to handle it. At this point she spends most of her time in her house. She can't go out without a family member going with her. She recently went on a cruise with her family and had a horrible time because she was so anxious. She is on Cymbalta 60 mg but doesn't take it consistently and I explained it will not work unless it's taken daily. She's never been on any other medications for anxiety but has tried Lexapro without success.  Currently the patient feels depressed and anxious. She is not really enjoying her life. She goes to church but is too nervous to participate much. She cries on an almost daily basis. She has chronic back pain. She complains of difficulty with short-term memory and cannot  remember things without her husband reminding her. She is extremely self-conscious and when she goes out she feels like people are watching her or judging her. Her sleep is poor and she only sleeps about 4 hours a night despite having sleep apnea and using a CPAP machine. She does not use drugs or alcohol and does not have auditory or visual hallucinations paranoia suicidal or homicidal ideation. She has panic attacks on a daily basis  The patient returns after 2 months. She states that she is generally doing better but has recently had a sinus infection and isn't breathing well at night. She feels like her CPAP is "suffocating me." She recently got on antibiotics and a nebulizer treatment. She states overall her anxiety is better and she generally takes the Xanax twice a day and I encouraged her to take one at bedtime to help with her sleep along with the trazodone. Her panic attacks are much better than they were in the past and she is getting out more she denies serious depression or thoughts of suicide  Associated Signs/Symptoms: Depression Symptoms:  depressed mood, anhedonia, hypersomnia, psychomotor agitation, feelings of worthlessness/guilt, difficulty concentrating, anxiety, panic attacks, loss of energy/fatigue, disturbed sleep, (Hypo) Manic Symptoms:  Distractibility, Anxiety Symptoms:  Excessive Worry, Panic Symptoms, Social Anxiety,   Past Psychiatric History: none  Previous Psychotropic Medications: Yes   Substance Abuse History in the last 12 months:  No.  Consequences of Substance Abuse: NA  Past Medical History:  Past Medical  History:  Diagnosis Date  . Acid reflux   . Anxiety   . Depression   . Fibromyalgia     Past Surgical History:  Procedure Laterality Date  . CESAREAN SECTION    . MOUTH SURGERY    . tubes tied      Family Psychiatric History: The patient's sister also has a history depression and anxiety  Family History:  Family History  Problem  Relation Age of Onset  . Hypertension Father   . Hypertension Brother   . Diabetes Brother   . Depression Brother   . Anxiety disorder Brother   . Stroke Maternal Grandmother   . Stroke Paternal Grandfather   . Congestive Heart Failure Sister   . Diabetes Sister   . Hypertension Sister   . Anxiety disorder Sister   . Depression Sister   . Heart disease    . Arthritis    . Cancer    . Asthma    . Diabetes    . Kidney disease      Social History:   Social History   Social History  . Marital status: Married    Spouse name: N/A  . Number of children: 2  . Years of education: 12   Occupational History  . none Unemployed  . Unemployed     Social History Main Topics  . Smoking status: Never Smoker  . Smokeless tobacco: Never Used  . Alcohol use No  . Drug use: No  . Sexual activity: Yes    Birth control/ protection: Surgical   Other Topics Concern  . None   Social History Narrative   Patient lives at home with husband and son.    Patient does not work   Patient has a high school education    Patient has 2 children.    Additional Social History: The patient grew up with both parents and asked in [place]. She is the fifth of 11 children. She had a very good childhood and states that her family is extremely close. She did have learning disabilities and was in special ed but completed high school. She can read and write fairly well but states her memory is not good. She is married and her husband is very supportive and both of her sons are as well. 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 Prednisone Other (See Comments)    Depression and crying  . Ambien [Zolpidem Tartrate] Other (See Comments)    hallucinations  . Penicillins     Metabolic Disorder Labs: No results found for: HGBA1C, MPG No results found for: PROLACTIN No  results found for: CHOL, TRIG, HDL, CHOLHDL, VLDL, LDLCALC   Current Medications: Current Outpatient Prescriptions  Medication Sig Dispense Refill  . Acetaminophen (TYLENOL 8 HOUR PO) Take by mouth.      Marland Kitchen albuterol (PROVENTIL) (2.5 MG/3ML) 0.083% nebulizer solution Take 2.5 mg by nebulization every 6 (six) hours as needed for wheezing or shortness of breath.    . ALPRAZolam (XANAX) 1 MG tablet Take 1 tablet (1 mg total) by mouth 3 (three) times daily as needed for anxiety. 90 tablet 2  . ARIPiprazole (ABILIFY) 2 MG tablet Take 1 tablet (2 mg total) by mouth at bedtime. 30 tablet 2  . baclofen (LIORESAL) 10 MG tablet Take 10 mg by mouth 3 (three) times daily.    Marland Kitchen  Cholecalciferol (VITAMIN D PO) Take by mouth.    . doxycycline (VIBRAMYCIN) 100 MG capsule Take 100 mg by mouth 2 (two) times daily.    . DULoxetine (CYMBALTA) 60 MG capsule Take 1 capsule (60 mg total) by mouth 2 (two) times daily. 30 capsule 2  . gabapentin (NEURONTIN) 600 MG tablet daily  1  . meloxicam (MOBIC) 15 MG tablet Take 15 mg by mouth daily.      . montelukast (SINGULAIR) 10 MG tablet Take 10 mg by mouth at bedtime.    Marland Kitchen omeprazole (PRILOSEC) 40 MG capsule Take 1 capsule (40 mg total) by mouth daily. Appointment needed for further refills 30 capsule 3  . SUMAtriptan (IMITREX) 25 MG tablet Take 25 mg by mouth every 2 (two) hours as needed for migraine. May repeat in 2 hours if headache persists or recurs.    . TRAMADOL HCL PO Take by mouth.    . traZODone (DESYREL) 100 MG tablet Take 1 tablet (100 mg total) by mouth at bedtime. 30 tablet 2   Current Facility-Administered Medications  Medication Dose Route Frequency Provider Last Rate Last Dose  . methylPREDNISolone acetate (DEPO-MEDROL) injection 40 mg  40 mg Intra-articular Once Vickki Hearing, MD        Neurologic: Headache: No Seizure: No Paresthesias:No  Musculoskeletal: Strength & Muscle Tone: within normal limits Gait & Station: normal Patient leans:  N/A  Psychiatric Specialty Exam: Review of Systems  Musculoskeletal: Positive for back pain.  Psychiatric/Behavioral: Positive for depression and memory loss. The patient is nervous/anxious and has insomnia.   All other systems reviewed and are negative.   Blood pressure (!) 139/91, pulse 80, height  (1.6 m), weight 265 lb (120.2 kg).Body mass index is 46.94 kg/m.  General Appearance: Casual, Neat and Well Groomed  Eye Contact:  Fair  Speech:  Clear and Coherent and Garbled  Volume:  Normal  Mood:A little anxious   Affect:  Congruent   Thought Process:  Goal Directed  Orientation:  Full (Time, Place, and Person)  Thought Content:  Rumination  Suicidal Thoughts:  No  Homicidal Thoughts:  No  Memory:  Immediate;   Fair Recent;   Poor Remote;   Poor  Judgement:  Fair  Insight:  Lacking  Psychomotor Activity:  Normal  Concentration:  Concentration: Poor and Attention Span: Poor  Recall:  Poor  Fund of Knowledge:Fair  Language: Good  Akathisia:  No  Handed:  Right  AIMS (if indicated):    Assets:  Communication Skills Desire for Improvement Social Support  ADL's:  Intact  Cognition: WNL  Sleep:  poor    Treatment Plan Summary: Medication management   The patient will Continue Cymbalta to 60 mg twice a day for depression and trazodone 100 mg at bedtime for sleep. She'll continue Xanax And was reminded to take 1 mg 3 times a day. She'll also continue Abilify 2 mg at bedtime for auditory hallucinations and augmentation. She'll continue her counseling with Florencia Reasons and return to see me in 2 months   Diannia Ruder, MD 5/1/201810:54 AM

## 2016-09-12 ENCOUNTER — Ambulatory Visit (HOSPITAL_COMMUNITY): Payer: Self-pay | Admitting: Psychiatry

## 2016-09-29 DIAGNOSIS — M545 Low back pain: Secondary | ICD-10-CM | POA: Diagnosis not present

## 2016-10-06 ENCOUNTER — Encounter (HOSPITAL_COMMUNITY): Payer: Self-pay | Admitting: Psychiatry

## 2016-10-06 ENCOUNTER — Ambulatory Visit (INDEPENDENT_AMBULATORY_CARE_PROVIDER_SITE_OTHER): Payer: BLUE CROSS/BLUE SHIELD | Admitting: Psychiatry

## 2016-10-06 DIAGNOSIS — F411 Generalized anxiety disorder: Secondary | ICD-10-CM | POA: Diagnosis not present

## 2016-10-06 NOTE — Progress Notes (Signed)
Patient:  Virginia Trujillo   DOB: 1965/07/03      MR Number: [id number]  [facility]:  [address] [place],  [place], [address]    Start: Monday 10/06/2016 10:14 AM  End: Monday 10/06/2016 10:55 AM   Provider/Observer:     Florencia Reasons, MSW, LCSW    Chief Complaint:     Anxiety   Reason For Service:   Patient presents with symptoms of anxiety that have been lilfelong per patient's report but symptoms worsened when her mother died 15 years ago. Also during the same 2 month period around her mother's death,her grandparents and her sister died. Patient states being nervous and not wanting to be around people. She says she just feels safer when she locks the door and she sleeps with  the light on. She also reports having panic attacks every other day over simple things like things not going right. She fears been around people and worries something bad is going to happen. Current stressors include her father being diagnosed with prostate cancer about a month ago, her brother became blind this year due to diabetes and attempted suicide about 3 months ago,. Patient reports seeing a psyhiatrist once in early adolescence due to isolative behaviors and anxiety.  She recently began seeing psychiatrist Dr. Tenny Craw. She reports no previous involvement in outpatient therapy. Patient reports no psychiatric hospitalizations.       Interventions Strategy:  Supportive   Participation Level:   Active  Participation Quality:  Appropriate      Behavioral Observation:  Casual, Alert, and Appropirate  Current Psychosocial Factors: Father has prostate cancer,  Content of Session:    reviewed symptoms, facilitated expression of feelings, praised patient's completion of homework assignment (using anxiety log, use of decatastrophizing,  practicing controlled breathing),  discussed effects of using log and using controlled breathing, reviewed connection between thought/mood/ behavior using examples  from patient's life, discussed effects of stress and anxiety on the body, discussed rationale for and practiced progressive muscle relaxation, assisted patient identify times to practice consistently, assigned patient to practice daily,     Current Status:   increased isolative behaviors, increased panic attacks,  sleep difficulty,  Suicidal/Homicidal:   No  Patient Progress:   Good. Patient reports using anxiety log and steps to decatasrophize. She is pleased with her efforts and reports this has been helpful. She reports being more mindful of thought patterns, using thought stopping techniques, and changing negative thoughts to healthy alternatives. However, she reports increased stress and nervousness as well as isolative behaviors. She attributes this to a change in one of her medications, increased arthritic pain, and a sinus infection in the past few weeks. Patient reports practicing controlled breathing daily.  She is looking forward to going on a cruise with family in 2 weeks.   Target Goals:   1.  Learn and implement calming skills to manage and reduce overall anxiety.      2. Identify, challenge, and replace biased, fearful self talk with positive, realistic, and empowering self talk.  Last Reviewed:   07/31/2016  Goals Addressed Today:    1,2,  Plan:      Return in 2 weeks  Impression/Diagnosis:   Patient presents with symptoms of anxiety that have been lilfelong per patient's report but symptoms worsened when her mother died 15 years ago. Also during the same 2 month period around her mother's death,her grandparents and her sister died. Symptoms include isolative behaviors, panic attacks, nervousness, sleep difficulty,  memory difficulty, persistent worry, daily crying spells, depressed mood  Diagnosis:  Axis I: Generalized anxiety disorder          Axis II: Deferred   Abel Hageman, LCSW 10/06/2016

## 2016-10-30 DIAGNOSIS — M545 Low back pain: Secondary | ICD-10-CM | POA: Diagnosis not present

## 2016-11-03 ENCOUNTER — Ambulatory Visit (INDEPENDENT_AMBULATORY_CARE_PROVIDER_SITE_OTHER): Payer: BLUE CROSS/BLUE SHIELD | Admitting: Psychiatry

## 2016-11-03 ENCOUNTER — Encounter (HOSPITAL_COMMUNITY): Payer: Self-pay | Admitting: Psychiatry

## 2016-11-03 VITALS — BP 114/76 | HR 73 | Ht 63.0 in | Wt 268.0 lb

## 2016-11-03 DIAGNOSIS — M25512 Pain in left shoulder: Secondary | ICD-10-CM | POA: Diagnosis not present

## 2016-11-03 DIAGNOSIS — M797 Fibromyalgia: Secondary | ICD-10-CM | POA: Diagnosis not present

## 2016-11-03 DIAGNOSIS — G4700 Insomnia, unspecified: Secondary | ICD-10-CM

## 2016-11-03 DIAGNOSIS — F411 Generalized anxiety disorder: Secondary | ICD-10-CM

## 2016-11-03 DIAGNOSIS — Z1389 Encounter for screening for other disorder: Secondary | ICD-10-CM | POA: Diagnosis not present

## 2016-11-03 DIAGNOSIS — Z818 Family history of other mental and behavioral disorders: Secondary | ICD-10-CM | POA: Diagnosis not present

## 2016-11-03 DIAGNOSIS — Z6841 Body Mass Index (BMI) 40.0 and over, adult: Secondary | ICD-10-CM | POA: Diagnosis not present

## 2016-11-03 DIAGNOSIS — Z560 Unemployment, unspecified: Secondary | ICD-10-CM | POA: Diagnosis not present

## 2016-11-03 DIAGNOSIS — R413 Other amnesia: Secondary | ICD-10-CM

## 2016-11-03 MED ORDER — ARIPIPRAZOLE 2 MG PO TABS: 2.0000 mg | ORAL_TABLET | ORAL | 2 refills | Status: DC

## 2016-11-03 MED ORDER — TRAZODONE HCL 100 MG PO TABS: 100.0000 mg | ORAL_TABLET | Freq: Every day | ORAL | 2 refills | Status: DC

## 2016-11-03 MED ORDER — DULOXETINE HCL 60 MG PO CPEP: 60.0000 mg | ORAL_CAPSULE | Freq: Two times a day (BID) | ORAL | 2 refills | Status: DC

## 2016-11-03 MED ORDER — ALPRAZOLAM 1 MG PO TABS: 1.0000 mg | ORAL_TABLET | Freq: Three times a day (TID) | ORAL | 2 refills | Status: DC | PRN

## 2016-11-03 NOTE — Progress Notes (Signed)
Psychiatric Initial Adult Assessment   Patient Identification: Virginia Trujillo MRN:  [id number] Date of Evaluation:  11/03/2016 Referral Source: Dr. Phillips Odor Chief Complaint:   Chief Complaint    Follow-up; Depression; Anxiety     Visit Diagnosis:    ICD-10-CM   1. Generalized anxiety disorder F41.1     History of Present Illness: This patient is a 51 year old married black female who lives with her husband and 2 sons ages 49 and 57 in [place]. 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  The patient was referred by her primary care physician, Dr. Phillips Odor, for further assessment and treatment of anxiety and depression.  The patient states that she's always been a somewhat anxious person. However her depression seriously worsened about 10 years ago. Her grandmother mother and father died all within a few months of each other. She was very close to all of these family members but particular to her mother. She became more depressed and anxious. She's had an increasingly difficult time working. She is to work in some sort of Designer, fashion/clothing but became very anxious being around people. Sometimes she would just walk off the job and not be able to handle it. At this point she spends most of her time in her house. She can't go out without a family member going with her. She recently went on a cruise with her family and had a horrible time because she was so anxious. She is on Cymbalta 60 mg but doesn't take it consistently and I explained it will not work unless it's taken daily. She's never been on any other medications for anxiety but has tried Lexapro without success.  Currently the patient feels depressed and anxious. She is not really enjoying her life. She goes to church but is too nervous to participate much. She cries on an almost daily basis. She has chronic back pain. She complains of difficulty with short-term memory and cannot remember things  without her husband reminding her. She is extremely self-conscious and when she goes out she feels like people are watching her or judging her. Her sleep is poor and she only sleeps about 4 hours a night despite having sleep apnea and using a CPAP machine. She does not use drugs or alcohol and does not have auditory or visual hallucinations paranoia suicidal or homicidal ideation. She has panic attacks on a daily basis  The patient returns after 2 months. She states that she is having a lot of trouble with muscle spasms particularly in her legs at night. She had trouble last night and couldn't sleep. She is seeing her primary care physician tomorrow. I mentioned that sometimes drugs like Abilify can cause muscle twitching but she really doesn't want to stop it because it has helped her mood. The Xanax is really helping with her anxiety and her mood is better with the Cymbalta. Her main concern today seems to be about the muscle spasms and joint pain  Associated Signs/Symptoms: Depression Symptoms:  depressed mood, anhedonia, hypersomnia, psychomotor agitation, feelings of worthlessness/guilt, difficulty concentrating, anxiety, panic attacks, loss of energy/fatigue, disturbed sleep, (Hypo) Manic Symptoms:  Distractibility, Anxiety Symptoms:  Excessive Worry, Panic Symptoms, Social Anxiety,   Past Psychiatric History: none  Previous Psychotropic Medications: Yes   Substance Abuse History in the last 12 months:  No.  Consequences of Substance Abuse: NA  Past Medical History:  Past Medical History:  Diagnosis Date  . Acid reflux   .  Anxiety   . Depression   . Fibromyalgia     Past Surgical History:  Procedure Laterality Date  . CESAREAN SECTION    . MOUTH SURGERY    . tubes tied      Family Psychiatric History: The patient's sister also has a history depression and anxiety  Family History:  Family History  Problem Relation Age of Onset  . Hypertension Father   .  Hypertension Brother   . Diabetes Brother   . Depression Brother   . Anxiety disorder Brother   . Stroke Maternal Grandmother   . Stroke Paternal Grandfather   . Congestive Heart Failure Sister   . Diabetes Sister   . Hypertension Sister   . Anxiety disorder Sister   . Depression Sister   . Heart disease Unknown   . Arthritis Unknown   . Cancer Unknown   . Asthma Unknown   . Diabetes Unknown   . Kidney disease Unknown     Social History:   Social History   Social History  . Marital status: Married    Spouse name: N/A  . Number of children: 2  . Years of education: 12   Occupational History  . none Unemployed  . Unemployed     Social History Main Topics  . Smoking status: Never Smoker  . Smokeless tobacco: Never Used  . Alcohol use No  . Drug use: No  . Sexual activity: Yes    Birth control/ protection: Surgical   Other Topics Concern  . None   Social History Narrative   Patient lives at home with husband and son.    Patient does not work   Patient has a high school education    Patient has 2 children.    Additional Social History: The patient grew up with both parents and asked in [place]. She is the fifth of 11 children. She had a very good childhood and states that her family is extremely close. She did have learning disabilities and was in special ed but completed high school. She can read and write fairly well but states her memory is not good. She is married and her husband is very supportive and both of her sons are as well. 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 Prednisone Other (See Comments)    Depression and crying  . Ambien [Zolpidem Tartrate] Other (See Comments)    hallucinations  . Penicillins     Metabolic Disorder Labs: No results found for: HGBA1C, MPG No results found for: PROLACTIN No results found  for: CHOL, TRIG, HDL, CHOLHDL, VLDL, LDLCALC   Current Medications: Current Outpatient Prescriptions  Medication Sig Dispense Refill  . Acetaminophen (TYLENOL 8 HOUR PO) Take by mouth.      Marland Kitchen. albuterol (PROVENTIL) (2.5 MG/3ML) 0.083% nebulizer solution Take 2.5 mg by nebulization every 6 (six) hours as needed for wheezing or shortness of breath.    . ALPRAZolam (XANAX) 1 MG tablet Take 1 tablet (1 mg total) by mouth 3 (three) times daily as needed for anxiety. 90 tablet 2  . ARIPiprazole (ABILIFY) 2 MG tablet Take 1 tablet (2 mg total) by mouth every morning. 30 tablet 2  . baclofen (LIORESAL) 10 MG tablet Take 10 mg by mouth 3 (three) times daily.    . Cholecalciferol (VITAMIN D PO) Take by mouth.    .Marland Kitchen  doxycycline (VIBRAMYCIN) 100 MG capsule Take 100 mg by mouth 2 (two) times daily.    . DULoxetine (CYMBALTA) 60 MG capsule Take 1 capsule (60 mg total) by mouth 2 (two) times daily. 30 capsule 2  . gabapentin (NEURONTIN) 600 MG tablet daily  1  . meloxicam (MOBIC) 15 MG tablet Take 15 mg by mouth daily.      . montelukast (SINGULAIR) 10 MG tablet Take 10 mg by mouth at bedtime.    Marland Kitchen omeprazole (PRILOSEC) 40 MG capsule Take 1 capsule (40 mg total) by mouth daily. Appointment needed for further refills 30 capsule 3  . SUMAtriptan (IMITREX) 25 MG tablet Take 25 mg by mouth every 2 (two) hours as needed for migraine. May repeat in 2 hours if headache persists or recurs.    . traZODone (DESYREL) 100 MG tablet Take 1 tablet (100 mg total) by mouth at bedtime. 30 tablet 2   No current facility-administered medications for this visit.     Neurologic: Headache: No Seizure: No Paresthesias:No  Musculoskeletal: Strength & Muscle Tone: within normal limits Gait & Station: normal Patient leans: N/A  Psychiatric Specialty Exam: Review of Systems  Musculoskeletal: Positive for back pain.  Psychiatric/Behavioral: Positive for depression and memory loss. The patient is nervous/anxious and has  insomnia.   All other systems reviewed and are negative.   Blood pressure 114/76, pulse 73, height 5\' 3"  (1.6 m), weight 268 lb (121.6 kg).Body mass index is 47.47 kg/m.  General Appearance: Casual, Neat and Well Groomed  Eye Contact:  Fair  Speech:  Clear and Coherent and Garbled  Volume:  Normal  Mood: anxious   Affect:  Congruent   Thought Process:  Goal Directed  Orientation:  Full (Time, Place, and Person)  Thought Content:  Rumination  Suicidal Thoughts:  No  Homicidal Thoughts:  No  Memory:  Immediate;   Fair Recent;   Poor Remote;   Poor  Judgement:  Fair  Insight:  Lacking  Psychomotor Activity:  Normal  Concentration:  Concentration: Poor and Attention Span: Poor  Recall:  Poor  Fund of Knowledge:Fair  Language: Good  Akathisia:  No  Handed:  Right  AIMS (if indicated):    Assets:  Communication Skills Desire for Improvement Social Support  ADL's:  Intact  Cognition: WNL  Sleep:  poor    Treatment Plan Summary: Medication management   The patient will Continue Cymbalta to 60 mg twice a day for depression and trazodone 100 mg at bedtime for sleep. She'll continue Xanax And was reminded to take 1 mg 3 times a day. She'll also continue Abilify 2 mg at bedtime for auditory hallucinations and augmentation. She'll continue her counseling with Florencia Reasons and return to see me in 3 months   Diannia Ruder, MD 7/2/201810:20 AM

## 2016-11-04 ENCOUNTER — Ambulatory Visit (HOSPITAL_COMMUNITY): Payer: Self-pay | Admitting: Psychiatry

## 2016-11-13 ENCOUNTER — Telehealth (HOSPITAL_COMMUNITY): Payer: Self-pay | Admitting: *Deleted

## 2016-11-13 NOTE — Telephone Encounter (Signed)
Left voice message, provider out of office 11/18/16. 

## 2016-11-18 ENCOUNTER — Ambulatory Visit (HOSPITAL_COMMUNITY): Payer: Self-pay | Admitting: Psychiatry

## 2016-11-29 DIAGNOSIS — M545 Low back pain: Secondary | ICD-10-CM | POA: Diagnosis not present

## 2016-12-05 ENCOUNTER — Ambulatory Visit (HOSPITAL_COMMUNITY): Payer: BLUE CROSS/BLUE SHIELD | Admitting: Psychiatry

## 2016-12-17 ENCOUNTER — Ambulatory Visit (INDEPENDENT_AMBULATORY_CARE_PROVIDER_SITE_OTHER): Payer: BLUE CROSS/BLUE SHIELD | Admitting: Psychiatry

## 2016-12-17 ENCOUNTER — Encounter (HOSPITAL_COMMUNITY): Payer: Self-pay | Admitting: Psychiatry

## 2016-12-17 DIAGNOSIS — F411 Generalized anxiety disorder: Secondary | ICD-10-CM | POA: Diagnosis not present

## 2016-12-17 NOTE — Progress Notes (Signed)
Patient:  Virginia Trujillo   DOB: 01/02/1966      MR Number: [id number]  [facility]:  [address] [place],  [place], [id number]    Start: Wednesday 12/17/2016 9:15 AM End: Wednesday 12/17/2016 10:05 AM  Provider/Observer:     Florencia ReasonsPeggy Ollivander See, MSW, LCSW    Chief Complaint:     Anxiety   Reason For Service:   Patient presents with symptoms of anxiety that have been lilfelong per patient's report but symptoms worsened when her mother died 15 years ago. Also during the same 2 month period around her mother's death,her grandparents and her sister died. Patient states being nervous and not wanting to be around people. She says she just feels safer when she locks the door and she sleeps with  the light on. She also reports having panic attacks every other day over simple things like things not going right. She fears been around people and worries something bad is going to happen. Current stressors include her father being diagnosed with prostate cancer about a month ago, her brother became blind this year due to diabetes and attempted suicide about 3 months ago,. Patient reports seeing a psyhiatrist once in early adolescence due to isolative behaviors and anxiety.  She recently began seeing psychiatrist Dr. Tenny Crawoss. She reports no previous involvement in outpatient therapy. Patient reports no psychiatric hospitalizations.       Interventions Strategy:  Supportive   Participation Level:   Active  Participation Quality:  Appropriate      Behavioral Observation:  Casual, Alert, and Appropirate  Current Psychosocial Factors: Father has prostate cancer,  Content of Session:    reviewed symptoms, facilitated expression of feelings, praised and reinforced patient's consistent practice of relaxation techniques, discussed stressors, praised and reinforced patient's use of decatastrophizing , provided psychoeducation regarding panic attacks discussed the panic cycle and ways to intervene,  discussed fight flight response and ways to trigger relaxation response, assigned patient to review handouts provided in session     Current Status:   decreased isolative behaviors, increased social contact and involvement in activity, continued panic attacks,   Suicidal/Homicidal:   No  Patient Progress:   Good. Patient last was seen 2 months ago. She reports improved ability in managing anxiety with the use of healthy coping skills. She also reports working with Dr. Tenny Crawoss regarding increase in her medication has also been very helpful. Patient cites several stressful situations she has experienced since last session but reports using relaxation techniques as well and as identifying, challenging, and replacing worry thoughts with healthy alternatives. She is very pleased with her progress. She continues to experience panic attacks and says she has these about once every 2 weeks. She also reports stress related to continued health issues but does not express excessive worry about this.     Target Goals:   1.  Learn and implement calming skills to manage and reduce overall anxiety.      2. Identify, challenge, and replace biased, fearful self talk with positive, realistic, and empowering self talk.  Last Reviewed:   07/31/2016  Goals Addressed Today:    1,2,  Plan:      Return in 2 weeks  Impression/Diagnosis:   Patient presents with symptoms of anxiety that have been lilfelong per patient's report but symptoms worsened when her mother died 15 years ago. Also during the same 2 month period around her mother's death,her grandparents and her sister died. Symptoms include isolative behaviors, panic attacks, nervousness, sleep  difficulty, memory difficulty, persistent worry, daily crying spells, depressed mood  Diagnosis:  Axis I: Generalized anxiety disorder          Axis II: Deferred   Virginia Amend, LCSW 12/17/2016

## 2016-12-30 DIAGNOSIS — M545 Low back pain: Secondary | ICD-10-CM | POA: Diagnosis not present

## 2017-01-09 ENCOUNTER — Other Ambulatory Visit (HOSPITAL_COMMUNITY): Payer: Self-pay | Admitting: Psychiatry

## 2017-01-12 ENCOUNTER — Ambulatory Visit (INDEPENDENT_AMBULATORY_CARE_PROVIDER_SITE_OTHER): Payer: BLUE CROSS/BLUE SHIELD | Admitting: Psychiatry

## 2017-01-12 ENCOUNTER — Encounter (HOSPITAL_COMMUNITY): Payer: Self-pay | Admitting: Psychiatry

## 2017-01-12 DIAGNOSIS — F411 Generalized anxiety disorder: Secondary | ICD-10-CM

## 2017-01-12 NOTE — Progress Notes (Signed)
Patient:  Virginia Trujillo   DOB: 08-Sep-1965      MR Number: [id number]          Location: [facility]:  [address] [place],  [place], [address]    Start: Monday 01/12/2017 11:15 AM  End: Monday 01/12/2017 11:57 AM  Provider/Observer:     Florencia Reasons, MSW, LCSW    Chief Complaint:     Anxiety   Reason For Service:   Patient presents with symptoms of anxiety that have been lilfelong per patient's report but symptoms worsened when her mother died 15 years ago. Also during the same 2 month period around her mother's death,her grandparents and her sister died. Patient states being nervous and not wanting to be around people. She says she just feels safer when she locks the door and she sleeps with  the light on. She also reports having panic attacks every other day over simple things like things not going right. She fears been around people and worries something bad is going to happen. Current stressors include her father being diagnosed with prostate cancer about a month ago, her brother became blind this year due to diabetes and attempted suicide about 3 months ago,. Patient reports seeing a psyhiatrist once in early adolescence due to isolative behaviors and anxiety.  She recently began seeing psychiatrist Dr. Tenny Craw. She reports no previous involvement in outpatient therapy. Patient reports no psychiatric hospitalizations.       Interventions Strategy:  Supportive   Participation Level:   Active  Participation Quality:  Appropriate      Behavioral Observation:  Casual, Alert, and Appropirate  Current Psychosocial Factors: Father has prostate cancer,  Content of Session:    reviewed symptoms, discussed stressors,  facilitated expression of thoughts and feelings, identify triggers of increased symptoms of depression and anxiety, discuseds the grief process including acute grief and integrated grief, discussed lapse versus relapse, reviewed early warning signs of depression,  reviewed strategies to intervene including behavioral activation and socialization, reviewed importance of medication compliance assigned patient to review previous handouts     Current Status:   increased isolative behaviors, decreased social contact and involvement in activity, sleep difficulty continued panic attacks,   Suicidal/Homicidal:   No  Patient Progress:   Fair. Patient last was seen about 3 weeks ago. She reports increased symptoms of depression and anxiety. Initially, she has difficulty identifying trigger. .Eventually, she identifies the deaths of 2 acquaintances which triggered increased thoughts about her deceased mother. Patient reports then beginning to worry about a variety of other issues. She reports becoming very depressed, having panic attacks, and not wanting to be around anyone. She also reports she missed a dosage of her antidepressant but has resumed taking regularly. She reports starting to review handouts from previous therapy sessions last night and says she started to feel a little better. 2 months ago.   Target Goals:   1.  Learn and implement calming skills to manage and reduce overall anxiety.      2. Identify, challenge, and replace biased, fearful self talk with positive, realistic, and empowering self talk.  Last Reviewed:   07/31/2016  Goals Addressed Today:    1,2,  Plan:      Return in 2 weeks  Impression/Diagnosis:   Patient presents with symptoms of anxiety that have been lilfelong per patient's report but symptoms worsened when her mother died 15 years ago. Also during the same 2 month period around her mother's death,her grandparents and her  sister died. Symptoms include isolative behaviors, panic attacks, nervousness, sleep difficulty, memory difficulty, persistent worry, daily crying spells, depressed mood  Diagnosis:  Axis I: Generalized anxiety disorder          Axis II: Deferred   Rhonda Vangieson, LCSW 01/12/2017

## 2017-01-14 ENCOUNTER — Other Ambulatory Visit (HOSPITAL_COMMUNITY): Payer: Self-pay | Admitting: Psychiatry

## 2017-01-28 DIAGNOSIS — M797 Fibromyalgia: Secondary | ICD-10-CM | POA: Diagnosis not present

## 2017-01-28 DIAGNOSIS — M25511 Pain in right shoulder: Secondary | ICD-10-CM | POA: Diagnosis not present

## 2017-01-28 DIAGNOSIS — Z6841 Body Mass Index (BMI) 40.0 and over, adult: Secondary | ICD-10-CM | POA: Diagnosis not present

## 2017-01-28 DIAGNOSIS — Z1389 Encounter for screening for other disorder: Secondary | ICD-10-CM | POA: Diagnosis not present

## 2017-01-30 DIAGNOSIS — M545 Low back pain: Secondary | ICD-10-CM | POA: Diagnosis not present

## 2017-02-02 ENCOUNTER — Telehealth (HOSPITAL_COMMUNITY): Payer: Self-pay | Admitting: *Deleted

## 2017-02-02 NOTE — Telephone Encounter (Signed)
left voice message provider out of office 02/03/17.

## 2017-02-03 ENCOUNTER — Ambulatory Visit (HOSPITAL_COMMUNITY): Payer: BLUE CROSS/BLUE SHIELD | Admitting: Psychiatry

## 2017-02-06 ENCOUNTER — Ambulatory Visit (INDEPENDENT_AMBULATORY_CARE_PROVIDER_SITE_OTHER): Payer: BLUE CROSS/BLUE SHIELD | Admitting: Psychiatry

## 2017-02-06 ENCOUNTER — Encounter (HOSPITAL_COMMUNITY): Payer: Self-pay | Admitting: Psychiatry

## 2017-02-06 DIAGNOSIS — F411 Generalized anxiety disorder: Secondary | ICD-10-CM | POA: Diagnosis not present

## 2017-02-06 NOTE — Progress Notes (Signed)
Patient:  Virginia Trujillo   DOB: Jun 10, 1965      MR Number: [id number]          Location: [facility]:  [address] [place],  [place], [address]    Start: Friday 02/06/2017 10:13 AM  End: Friday 02/06/2017 10:53 AM  Provider/Observer:     Florencia Reasons, MSW, LCSW    Chief Complaint:     Anxiety   Reason For Service:   Patient presents with symptoms of anxiety that have been lilfelong per patient's report but symptoms worsened when her mother died 15 years ago. Also during the same 2 month period around her mother's death,her grandparents and her sister died. Patient states being nervous and not wanting to be around people. She says she just feels safer when she locks the door and she sleeps with  the light on. She also reports having panic attacks every other day over simple things like things not going right. She fears been around people and worries something bad is going to happen. Current stressors include her father being diagnosed with prostate cancer about a month ago, her brother became blind this year due to diabetes and attempted suicide about 3 months ago,. Patient reports seeing a psyhiatrist once in early adolescence due to isolative behaviors and anxiety.  She recently began seeing psychiatrist Dr. Tenny Craw. She reports no previous involvement in outpatient therapy. Patient reports no psychiatric hospitalizations.          Interventions Strategy:  Supportive   Participation Level:   Active  Participation Quality:  Appropriate      Behavioral Observation:  Casual, Alert, and Appropirate  Current Psychosocial Factors: Father has prostate cancer,   Content of Session:    reviewed symptoms, discussed stressors,  facilitated expression of thoughts and feelings, praised and reinforced patient's recognition of early warning signs of depression and use of strategies to intervene to avoid relapse, assisted patient identify ways to maintain consistency regarding self-care identify  triggers of increased symptoms of depression and anxiety,    Current Status:   increased isolative behaviors, decreased social contact and involvement in activity, sleep difficulty continued panic attacks,   Suicidal/Homicidal:   No  Patient Progress:   Good. Patient reports experiencing severe pain and swelling in her arms about 2 weeks ago. This triggered increased symptoms of depression. However, patient reports recognizing symptoms and using strategies discussed in previous sessions to avoid relapse. She also reports her grandson was born this past Monday and she was present for the birth. She is pleased with her ability to manage her anxiety about being in crowded situations so that she could be there for the birth. She is very excited about her grandson and reports being involved in his care has been very helpful. She states she has been more relaxed and sleeps well at night. She also is motivated to improve self-care especially around weight management.   Target Goals:   1.  Learn and implement calming skills to manage and reduce overall anxiety.      2. Identify, challenge, and replace biased, fearful self talk with positive, realistic, and empowering self talk.  Last Reviewed:   07/31/2016  Goals Addressed Today:    1,2,  Plan:      Return in 2 weeks  Impression/Diagnosis:   Patient presents with symptoms of anxiety that have been lilfelong per patient's report but symptoms worsened when her mother died 15 years ago. Also during the same 2 month period around her  mother's death,her grandparents and her sister died. Symptoms include isolative behaviors, panic attacks, nervousness, sleep difficulty, memory difficulty, persistent worry, daily crying spells, depressed mood  Diagnosis:  Axis I: Generalized anxiety disorder          Axis II: Deferred   Colm Lyford, LCSW 02/06/2017

## 2017-02-10 ENCOUNTER — Ambulatory Visit (HOSPITAL_COMMUNITY): Payer: Self-pay | Admitting: Psychiatry

## 2017-02-18 ENCOUNTER — Encounter (HOSPITAL_COMMUNITY): Payer: Self-pay | Admitting: Psychiatry

## 2017-02-18 ENCOUNTER — Ambulatory Visit (INDEPENDENT_AMBULATORY_CARE_PROVIDER_SITE_OTHER): Payer: BLUE CROSS/BLUE SHIELD | Admitting: Psychiatry

## 2017-02-18 VITALS — BP 161/105 | HR 84 | Ht 63.0 in | Wt 266.8 lb

## 2017-02-18 DIAGNOSIS — F411 Generalized anxiety disorder: Secondary | ICD-10-CM

## 2017-02-18 DIAGNOSIS — F410 Panic disorder [episodic paroxysmal anxiety] without agoraphobia: Secondary | ICD-10-CM | POA: Diagnosis not present

## 2017-02-18 DIAGNOSIS — M542 Cervicalgia: Secondary | ICD-10-CM | POA: Diagnosis not present

## 2017-02-18 DIAGNOSIS — R413 Other amnesia: Secondary | ICD-10-CM | POA: Diagnosis not present

## 2017-02-18 DIAGNOSIS — R450 Nervousness: Secondary | ICD-10-CM | POA: Diagnosis not present

## 2017-02-18 DIAGNOSIS — R4584 Anhedonia: Secondary | ICD-10-CM | POA: Diagnosis not present

## 2017-02-18 DIAGNOSIS — Z818 Family history of other mental and behavioral disorders: Secondary | ICD-10-CM | POA: Diagnosis not present

## 2017-02-18 DIAGNOSIS — R5383 Other fatigue: Secondary | ICD-10-CM | POA: Diagnosis not present

## 2017-02-18 DIAGNOSIS — R4583 Excessive crying of child, adolescent or adult: Secondary | ICD-10-CM | POA: Diagnosis not present

## 2017-02-18 DIAGNOSIS — M549 Dorsalgia, unspecified: Secondary | ICD-10-CM

## 2017-02-18 DIAGNOSIS — Z560 Unemployment, unspecified: Secondary | ICD-10-CM

## 2017-02-18 MED ORDER — ARIPIPRAZOLE 2 MG PO TABS: 2.0000 mg | ORAL_TABLET | ORAL | 2 refills | Status: DC

## 2017-02-18 MED ORDER — ALPRAZOLAM 1 MG PO TABS: 1.0000 mg | ORAL_TABLET | Freq: Three times a day (TID) | ORAL | 2 refills | Status: DC | PRN

## 2017-02-18 MED ORDER — DULOXETINE HCL 60 MG PO CPEP: 60.0000 mg | ORAL_CAPSULE | Freq: Two times a day (BID) | ORAL | 2 refills | Status: DC

## 2017-02-18 MED ORDER — TRAZODONE HCL 100 MG PO TABS: 100.0000 mg | ORAL_TABLET | Freq: Every day | ORAL | 2 refills | Status: DC
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
## 2017-02-20 ENCOUNTER — Ambulatory Visit (HOSPITAL_COMMUNITY): Payer: Self-pay | Admitting: Psychiatry

## 2017-03-01 DIAGNOSIS — M545 Low back pain: Secondary | ICD-10-CM | POA: Diagnosis not present

## 2017-03-06 ENCOUNTER — Ambulatory Visit (INDEPENDENT_AMBULATORY_CARE_PROVIDER_SITE_OTHER): Payer: BLUE CROSS/BLUE SHIELD | Admitting: Psychiatry

## 2017-03-06 ENCOUNTER — Encounter (HOSPITAL_COMMUNITY): Payer: Self-pay | Admitting: Psychiatry

## 2017-03-06 DIAGNOSIS — F411 Generalized anxiety disorder: Secondary | ICD-10-CM
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
## 2017-03-20 ENCOUNTER — Ambulatory Visit (HOSPITAL_COMMUNITY): Payer: Self-pay | Admitting: Psychiatry

## 2017-04-01 DIAGNOSIS — M545 Low back pain: Secondary | ICD-10-CM | POA: Diagnosis not present

## 2017-04-02 ENCOUNTER — Ambulatory Visit (HOSPITAL_COMMUNITY): Payer: Self-pay | Admitting: Psychiatry

## 2017-04-14 ENCOUNTER — Ambulatory Visit (HOSPITAL_COMMUNITY): Payer: Self-pay | Admitting: Psychiatry

## 2017-04-23 ENCOUNTER — Ambulatory Visit (HOSPITAL_COMMUNITY): Payer: BLUE CROSS/BLUE SHIELD | Admitting: Psychiatry

## 2017-05-01 DIAGNOSIS — M545 Low back pain: Secondary | ICD-10-CM | POA: Diagnosis not present

## 2017-05-21 ENCOUNTER — Encounter (HOSPITAL_COMMUNITY): Payer: Self-pay | Admitting: Psychiatry

## 2017-05-21 ENCOUNTER — Ambulatory Visit (HOSPITAL_COMMUNITY): Payer: BLUE CROSS/BLUE SHIELD | Admitting: Psychiatry

## 2017-05-21 VITALS — BP 116/80 | HR 79 | Ht 63.0 in | Wt 265.0 lb

## 2017-05-21 DIAGNOSIS — Z560 Unemployment, unspecified: Secondary | ICD-10-CM | POA: Diagnosis not present

## 2017-05-21 DIAGNOSIS — Z818 Family history of other mental and behavioral disorders: Secondary | ICD-10-CM

## 2017-05-21 DIAGNOSIS — M549 Dorsalgia, unspecified: Secondary | ICD-10-CM

## 2017-05-21 DIAGNOSIS — M542 Cervicalgia: Secondary | ICD-10-CM

## 2017-05-21 DIAGNOSIS — G8929 Other chronic pain: Secondary | ICD-10-CM | POA: Diagnosis not present

## 2017-05-21 DIAGNOSIS — F411 Generalized anxiety disorder: Secondary | ICD-10-CM

## 2017-05-21 DIAGNOSIS — F329 Major depressive disorder, single episode, unspecified: Secondary | ICD-10-CM

## 2017-05-21 DIAGNOSIS — Z79899 Other long term (current) drug therapy: Secondary | ICD-10-CM | POA: Diagnosis not present

## 2017-05-21 MED ORDER — ARIPIPRAZOLE 2 MG PO TABS: 2.0000 mg | ORAL_TABLET | ORAL | 2 refills | Status: DC

## 2017-05-21 MED ORDER — ALPRAZOLAM 1 MG PO TABS: 1.0000 mg | ORAL_TABLET | Freq: Three times a day (TID) | ORAL | 2 refills | Status: DC | PRN

## 2017-05-21 MED ORDER — DULOXETINE HCL 60 MG PO CPEP: 60.0000 mg | ORAL_CAPSULE | Freq: Two times a day (BID) | ORAL | 2 refills | Status: DC

## 2017-05-21 NOTE — Progress Notes (Signed)
BH MD/PA/NP OP Progress Note  05/21/2017 11:47 AM Virginia Trujillo  MRN:  [id number]  Chief Complaint:  Chief Complaint    Depression; Anxiety; Follow-up     HPI: This patient is a 52 year old married black female who lives with her husband and 2 sons ages 3724 and 5621 in [place]. 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  The patient was referred by her primary care physician, Dr. Phillips OdorGolding, for further assessment and treatment of anxiety and depression.  The patient states that she's always been a somewhat anxious person. However her depression seriously worsened about 10 years ago. Her grandmother mother and father died all within a few months of each other. She was very close to all of these family members but particular to her mother. She became more depressed and anxious. She's had an increasingly difficult time working. She is to work in some sort of Designer, fashion/clothingclothing factory but became very anxious being around people. Sometimes she would just walk off the job and not be able to handle it. At this point she spends most of her time in her house. She can't go out without a family member going with her. She recently went on a cruise with her family and had a horrible time because she was so anxious. She is on Cymbalta 60 mg but doesn't take it consistently and I explained it will not work unless it's taken daily. She's never been on any other medications for anxiety but has tried Lexapro without success.  Currently the patient feels depressed and anxious. She is not really enjoying her life. She goes to church but is too nervous to participate much. She cries on an almost daily basis. She has chronic back pain. She complains of difficulty with short-term memory and cannot remember things without her husband reminding her. She is extremely self-conscious and when she goes out she feels like people are watching her or judging her. Her sleep is poor and she only  sleeps about 4 hours a night despite having sleep apnea and using a CPAP machine. She does not use drugs or alcohol and does not have auditory or visual hallucinations paranoia suicidal or homicidal ideation. She has panic attacks on a daily basis  She returns after 3 months.  She states that her neck and back have been hurting a lot and this is really bothering her.  It is affecting her sleep.  She is supposed to see her physician tomorrow for an injection which should help.  She takes tramadol but it is not helping that much.  In general her mood is been okay and she is having a lot less panic attacks since we started the medications.  She is very focused on her pain today Visit Diagnosis:    ICD-10-CM   1. Generalized anxiety disorder F41.1     Past Psychiatric History: none  Past Medical History:  Past Medical History:  Diagnosis Date  . Acid reflux   . Anxiety   . Depression   . Fibromyalgia     Past Surgical History:  Procedure Laterality Date  . CESAREAN SECTION    . MOUTH SURGERY    . tubes tied      Family Psychiatric History: See below  Family History:  Family History  Problem Relation Age of Onset  . Hypertension Father   . Hypertension Brother   . Diabetes Brother   . Depression Brother   . Anxiety disorder  Brother   . Stroke Maternal Grandmother   . Stroke Paternal Grandfather   . Congestive Heart Failure Sister   . Diabetes Sister   . Hypertension Sister   . Anxiety disorder Sister   . Depression Sister   . Heart disease Unknown   . Arthritis Unknown   . Cancer Unknown   . Asthma Unknown   . Diabetes Unknown   . Kidney disease Unknown     Social History:  Social History   Socioeconomic History  . Marital status: Married    Spouse name: None  . Number of children: 2  . Years of education: 34  . Highest education level: None  Social Needs  . Financial resource strain: None  . Food insecurity - worry: None  . Food insecurity - inability: None   . Transportation needs - medical: None  . Transportation needs - non-medical: None  Occupational History  . Occupation: none    Employer: UNEMPLOYED  . Occupation: Unemployed   Tobacco Use  . Smoking status: Never Smoker  . Smokeless tobacco: Never Used  Substance and Sexual Activity  . Alcohol use: No  . Drug use: No  . Sexual activity: Yes    Birth control/protection: Surgical  Other Topics Concern  . None  Social History Narrative   Patient lives at home with husband and son.    Patient does not work   Patient has a high school education    Patient has 2 children.    Allergies:  Allergies  Allergen Reactions  . Prednisone Other (See Comments)    Depression and crying  . Ambien [Zolpidem Tartrate] Other (See Comments)    hallucinations  . Penicillins     Metabolic Disorder Labs: No results found for: HGBA1C, MPG No results found for: PROLACTIN No results found for: CHOL, TRIG, HDL, CHOLHDL, VLDL, LDLCALC Lab Results  Component Value Date   TSH 3.190 11/29/2013    Therapeutic Level Labs: No results found for: LITHIUM No results found for: VALPROATE No components found for:  CBMZ  Current Medications: Current Outpatient Medications  Medication Sig Dispense Refill  . Acetaminophen (TYLENOL 8 HOUR PO) Take by mouth.      Marland Kitchen albuterol (PROVENTIL) (2.5 MG/3ML) 0.083% nebulizer solution Take 2.5 mg by nebulization every 6 (six) hours as needed for wheezing or shortness of breath.    . ALPRAZolam (XANAX) 1 MG tablet Take 1 tablet (1 mg total) by mouth 3 (three) times daily as needed for anxiety. 90 tablet 2  . ARIPiprazole (ABILIFY) 2 MG tablet Take 1 tablet (2 mg total) by mouth every morning. 30 tablet 2  . baclofen (LIORESAL) 10 MG tablet Take 10 mg by mouth 3 (three) times daily.    . Cholecalciferol (VITAMIN D PO) Take by mouth.    . doxycycline (VIBRAMYCIN) 100 MG capsule Take 100 mg by mouth 2 (two) times daily.    . DULoxetine (CYMBALTA) 60 MG capsule Take  1 capsule (60 mg total) by mouth 2 (two) times daily. 30 capsule 2  . gabapentin (NEURONTIN) 600 MG tablet daily  1  . meloxicam (MOBIC) 15 MG tablet Take 15 mg by mouth daily.      . montelukast (SINGULAIR) 10 MG tablet Take 10 mg by mouth at bedtime.    Marland Kitchen omeprazole (PRILOSEC) 40 MG capsule Take 1 capsule (40 mg total) by mouth daily. Appointment needed for further refills 30 capsule 3  . SUMAtriptan (IMITREX) 25 MG tablet Take 25 mg by mouth every  2 (two) hours as needed for migraine. May repeat in 2 hours if headache persists or recurs.    . traMADol (ULTRAM-ER) 300 MG 24 hr tablet Take 300 mg by mouth daily.    . traZODone (DESYREL) 100 MG tablet Take 1 tablet (100 mg total) by mouth at bedtime. 30 tablet 2   No current facility-administered medications for this visit.      Musculoskeletal: Strength & Muscle Tone: within normal limits Gait & Station: normal Patient leans: N/A  Psychiatric Specialty Exam: Review of Systems  Musculoskeletal: Positive for back pain and neck pain.  Psychiatric/Behavioral: The patient is nervous/anxious.   All other systems reviewed and are negative.   Blood pressure 116/80, pulse 79, height 5\' 3"  (1.6 m), weight 265 lb (120.2 kg), SpO2 99 %.Body mass index is 46.94 kg/m.  General Appearance: Casual and Fairly Groomed  Eye Contact:  Good  Speech:  Clear and Coherent  Volume:  Normal  Mood:  Anxious  Affect:  Congruent  Thought Process:  Goal Directed  Orientation:  Full (Time, Place, and Person)  Thought Content: Rumination   Suicidal Thoughts:  No  Homicidal Thoughts:  No  Memory:  Immediate;   Good Recent;   Fair Remote;   Fair  Judgement:  Impaired  Insight:  Lacking  Psychomotor Activity:  Decreased  Concentration:  Concentration: Fair and Attention Span: Fair  Recall:  [employer] of Knowledge: Fair  Language: Good  Akathisia:  No  Handed:  Right  AIMS (if indicated): not done  Assets:  Communication Skills Desire for  Improvement Resilience Social Support Talents/Skills  ADL's:  Intact  Cognition: WNL  Sleep:  Fair   Screenings:   Assessment and Plan: Patient is a 52 year old female with a history of depression anxiety and chronic pain.  Her pain is in the forefront today and it is difficult for her to focus on anything else.  I suggested she go back for reevaluation by her neurosurgeon.  In the meantime she will continue Cymbalta 60 mg twice a day for depression and chronic pain, Abilify 2 mg each morning for augmentation of the right antidepressant and Xanax 1 mg 3 times daily as needed for anxiety.  She will return to see me in 3 months and will also return to a counselor here   Diannia Ruder, MD 05/21/2017, 11:47 AM

## 2017-05-25 DIAGNOSIS — Z1389 Encounter for screening for other disorder: Secondary | ICD-10-CM | POA: Diagnosis not present

## 2017-05-25 DIAGNOSIS — M2550 Pain in unspecified joint: Secondary | ICD-10-CM | POA: Diagnosis not present

## 2017-05-25 DIAGNOSIS — M5090 Cervical disc disorder, unspecified, unspecified cervical region: Secondary | ICD-10-CM | POA: Diagnosis not present

## 2017-05-25 DIAGNOSIS — Z6841 Body Mass Index (BMI) 40.0 and over, adult: Secondary | ICD-10-CM | POA: Diagnosis not present

## 2017-05-25 DIAGNOSIS — M1991 Primary osteoarthritis, unspecified site: Secondary | ICD-10-CM | POA: Diagnosis not present

## 2017-06-05 DIAGNOSIS — Z6841 Body Mass Index (BMI) 40.0 and over, adult: Secondary | ICD-10-CM | POA: Diagnosis not present

## 2017-06-05 DIAGNOSIS — M25511 Pain in right shoulder: Secondary | ICD-10-CM | POA: Diagnosis not present

## 2017-06-05 DIAGNOSIS — M797 Fibromyalgia: Secondary | ICD-10-CM | POA: Diagnosis not present

## 2017-06-08 ENCOUNTER — Encounter (HOSPITAL_COMMUNITY): Payer: Self-pay | Admitting: Psychiatry

## 2017-06-08 ENCOUNTER — Ambulatory Visit (HOSPITAL_COMMUNITY): Payer: BLUE CROSS/BLUE SHIELD | Admitting: Psychiatry

## 2017-06-08 DIAGNOSIS — M79601 Pain in right arm: Secondary | ICD-10-CM | POA: Diagnosis not present

## 2017-06-08 DIAGNOSIS — F411 Generalized anxiety disorder: Secondary | ICD-10-CM

## 2017-06-08 DIAGNOSIS — M542 Cervicalgia: Secondary | ICD-10-CM | POA: Diagnosis not present

## 2017-06-08 DIAGNOSIS — Z79899 Other long term (current) drug therapy: Secondary | ICD-10-CM | POA: Diagnosis not present

## 2017-06-08 DIAGNOSIS — M797 Fibromyalgia: Secondary | ICD-10-CM | POA: Diagnosis not present

## 2017-06-08 DIAGNOSIS — K219 Gastro-esophageal reflux disease without esophagitis: Secondary | ICD-10-CM | POA: Diagnosis not present

## 2017-06-08 DIAGNOSIS — M25511 Pain in right shoulder: Secondary | ICD-10-CM | POA: Diagnosis not present

## 2017-06-08 DIAGNOSIS — M47812 Spondylosis without myelopathy or radiculopathy, cervical region: Secondary | ICD-10-CM | POA: Diagnosis not present

## 2017-06-08 NOTE — Progress Notes (Signed)
Patient:  Thressa Shellerddie J Hor   DOB: 01/24/1966      MR Number: [id number]          Location[facility]:  [address]    [place],  [place], [id number]    Start: Monday 06/08/2017 11:20 AM  End: Monday 06/08/2017 11:44 AM  Provider/Observer:     Florencia ReasonsPeggy Bynum, MSW, LCSW    Chief Complaint:     Anxiety   Reason For Service:   Patient presents with symptoms of anxiety that have been lilfelong per patient's report but symptoms worsened when her mother died 15 years ago. Also during the same 2 month period around her mother's death,her grandparents and her sister died. Patient states being nervous and not wanting to be around people. She says she just feels safer when she locks the door and she sleeps with  the light on. She also reports having panic attacks every other day over simple things like things not going right. She fears been around people and worries something bad is going to happen. Current stressors include her father being diagnosed with prostate cancer about a month ago, her brother became blind this year due to diabetes and attempted suicide about 3 months ago,. Patient reports seeing a psyhiatrist once in early adolescence due to isolative behaviors and anxiety.  She recently began seeing psychiatrist Dr. Tenny Crawoss. She reports no previous involvement in outpatient therapy. Patient reports no psychiatric hospitalizations.          Interventions Strategy:  Supportive   Participation Level:   Active  Participation Quality:  Appropriate      Behavioral Observation:  Casual, Alert, and Appropirate  Current Psychosocial Factors:   Content of Session:    reviewed symptoms, discussed stressors,  facilitated expression of thoughts and feelings, assisted patient identify calming and distracting activities to manage pain  Current Status:   Anxiety, excessive worry  Suicidal/Homicidal:   No  Patient Progress:    Patient last was seen about 2 months ago. She reports doing fairly well  but experiencing increased stress and anxiety in recent weeks due to severe left arm pain. She saw her doctor this past Friday and was given an injection as well as a prescription for hydrocodone acetaminophen. However, patient reports no relief in pain and is experiencing significant sleep difficulty due to pain. She reports still trying to participate in activities when she can. She has been referred to a specialist and is waiting for a call from the orthopedist to schedule an appointment. Therapist and patient agreed to end session early today as patient plans to go to the ER due to severe arm pain.   Target Goals:   1.  Learn and implement calming skills to manage and reduce overall anxiety.      2. Identify, challenge, and replace biased, fearful self talk with positive, realistic, and empowering self talk.  Last Reviewed:   07/31/2016  Goals Addressed Today:    1,2,  Plan:      Return in 2 weeks  Impression/Diagnosis:   Patient presents with symptoms of anxiety that have been lilfelong per patient's report but symptoms worsened when her mother died 15 years ago. Also during the same 2 month period around her mother's death,her grandparents and her sister died. Symptoms include isolative behaviors, panic attacks, nervousness, sleep difficulty, memory difficulty, persistent worry, daily crying spells, depressed mood  Diagnosis:  Axis I: Generalized anxiety disorder          Axis II:  Deferred   BYNUM,PEGGY, LCSW 06/08/2017

## 2017-06-15 DIAGNOSIS — M5020 Other cervical disc displacement, unspecified cervical region: Secondary | ICD-10-CM | POA: Diagnosis not present

## 2017-06-15 DIAGNOSIS — M7581 Other shoulder lesions, right shoulder: Secondary | ICD-10-CM | POA: Diagnosis not present

## 2017-06-18 DIAGNOSIS — M5416 Radiculopathy, lumbar region: Secondary | ICD-10-CM | POA: Diagnosis not present

## 2017-06-18 DIAGNOSIS — M5412 Radiculopathy, cervical region: Secondary | ICD-10-CM | POA: Diagnosis not present

## 2017-06-18 DIAGNOSIS — M797 Fibromyalgia: Secondary | ICD-10-CM | POA: Diagnosis not present

## 2017-06-18 DIAGNOSIS — M5136 Other intervertebral disc degeneration, lumbar region: Secondary | ICD-10-CM | POA: Diagnosis not present

## 2017-06-22 ENCOUNTER — Ambulatory Visit (HOSPITAL_COMMUNITY): Payer: BLUE CROSS/BLUE SHIELD | Admitting: Psychiatry

## 2017-06-26 ENCOUNTER — Other Ambulatory Visit: Payer: Self-pay | Admitting: Rehabilitation

## 2017-06-26 DIAGNOSIS — M5412 Radiculopathy, cervical region: Secondary | ICD-10-CM

## 2017-07-02 DIAGNOSIS — M545 Low back pain: Secondary | ICD-10-CM | POA: Diagnosis not present

## 2017-07-03 ENCOUNTER — Ambulatory Visit
Admission: RE | Admit: 2017-07-03 | Discharge: 2017-07-03 | Disposition: A | Discharge status: Home / Self Care | Payer: BLUE CROSS/BLUE SHIELD | Source: Ambulatory Visit | Attending: Rehabilitation | Admitting: Rehabilitation

## 2017-07-03 DIAGNOSIS — M4802 Spinal stenosis, cervical region: Secondary | ICD-10-CM | POA: Diagnosis not present

## 2017-07-03 DIAGNOSIS — M5412 Radiculopathy, cervical region: Secondary | ICD-10-CM

## 2017-07-16 DIAGNOSIS — M4802 Spinal stenosis, cervical region: Secondary | ICD-10-CM | POA: Diagnosis not present

## 2017-07-16 DIAGNOSIS — M5412 Radiculopathy, cervical region: Secondary | ICD-10-CM | POA: Diagnosis not present

## 2017-07-21 ENCOUNTER — Ambulatory Visit (HOSPITAL_COMMUNITY): Payer: Self-pay | Admitting: Psychiatry

## 2017-07-30 DIAGNOSIS — M545 Low back pain: Secondary | ICD-10-CM | POA: Diagnosis not present

## 2017-08-06 ENCOUNTER — Encounter: Payer: Self-pay | Admitting: Orthopedic Surgery

## 2017-08-10 DIAGNOSIS — M5412 Radiculopathy, cervical region: Secondary | ICD-10-CM | POA: Diagnosis not present

## 2017-08-18 ENCOUNTER — Ambulatory Visit (HOSPITAL_COMMUNITY): Payer: BLUE CROSS/BLUE SHIELD | Admitting: Psychiatry

## 2017-08-18 ENCOUNTER — Encounter (HOSPITAL_COMMUNITY): Payer: Self-pay | Admitting: Psychiatry

## 2017-08-18 DIAGNOSIS — F411 Generalized anxiety disorder: Secondary | ICD-10-CM | POA: Diagnosis not present
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
## 2017-08-19 ENCOUNTER — Ambulatory Visit (HOSPITAL_COMMUNITY): Payer: BLUE CROSS/BLUE SHIELD | Admitting: Psychiatry

## 2017-08-19 ENCOUNTER — Encounter (HOSPITAL_COMMUNITY): Payer: Self-pay | Admitting: Psychiatry

## 2017-08-19 VITALS — BP 134/66 | HR 80 | Ht 63.0 in | Wt 261.0 lb

## 2017-08-19 DIAGNOSIS — M549 Dorsalgia, unspecified: Secondary | ICD-10-CM

## 2017-08-19 DIAGNOSIS — G4730 Sleep apnea, unspecified: Secondary | ICD-10-CM

## 2017-08-19 DIAGNOSIS — G8929 Other chronic pain: Secondary | ICD-10-CM

## 2017-08-19 DIAGNOSIS — F411 Generalized anxiety disorder: Secondary | ICD-10-CM | POA: Diagnosis not present

## 2017-08-19 DIAGNOSIS — F339 Major depressive disorder, recurrent, unspecified: Secondary | ICD-10-CM

## 2017-08-19 DIAGNOSIS — Z818 Family history of other mental and behavioral disorders: Secondary | ICD-10-CM | POA: Diagnosis not present

## 2017-08-19 DIAGNOSIS — Z560 Unemployment, unspecified: Secondary | ICD-10-CM

## 2017-08-19 DIAGNOSIS — Z9989 Dependence on other enabling machines and devices: Secondary | ICD-10-CM

## 2017-08-19 DIAGNOSIS — M542 Cervicalgia: Secondary | ICD-10-CM

## 2017-08-19 DIAGNOSIS — R0981 Nasal congestion: Secondary | ICD-10-CM

## 2017-08-19 MED ORDER — TRAZODONE HCL 100 MG PO TABS: 100.0000 mg | ORAL_TABLET | Freq: Every day | ORAL | 2 refills | Status: DC

## 2017-08-19 MED ORDER — DULOXETINE HCL 60 MG PO CPEP: 60.0000 mg | ORAL_CAPSULE | Freq: Two times a day (BID) | ORAL | 2 refills | Status: DC

## 2017-08-19 MED ORDER — ARIPIPRAZOLE 2 MG PO TABS: 2.0000 mg | ORAL_TABLET | ORAL | 2 refills | Status: DC

## 2017-08-19 MED ORDER — ALPRAZOLAM 1 MG PO TABS: 1.0000 mg | ORAL_TABLET | Freq: Three times a day (TID) | ORAL | 2 refills | Status: DC | PRN
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
## 2017-08-30 DIAGNOSIS — M545 Low back pain: Secondary | ICD-10-CM | POA: Diagnosis not present

## 2017-09-29 DIAGNOSIS — M545 Low back pain: Secondary | ICD-10-CM | POA: Diagnosis not present

## 2017-10-02 ENCOUNTER — Ambulatory Visit (HOSPITAL_COMMUNITY): Payer: Self-pay | Admitting: Psychiatry

## 2017-10-13 ENCOUNTER — Ambulatory Visit (HOSPITAL_COMMUNITY): Payer: BLUE CROSS/BLUE SHIELD | Admitting: Psychiatry

## 2017-10-26 ENCOUNTER — Ambulatory Visit (HOSPITAL_COMMUNITY): Payer: BLUE CROSS/BLUE SHIELD | Admitting: Psychiatry

## 2017-10-26 ENCOUNTER — Encounter (HOSPITAL_COMMUNITY): Payer: Self-pay | Admitting: Psychiatry

## 2017-10-26 DIAGNOSIS — F411 Generalized anxiety disorder: Secondary | ICD-10-CM | POA: Diagnosis not present
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
## 2017-10-27 ENCOUNTER — Ambulatory Visit (HOSPITAL_COMMUNITY): Payer: BLUE CROSS/BLUE SHIELD | Admitting: Psychiatry

## 2017-10-30 DIAGNOSIS — M545 Low back pain: Secondary | ICD-10-CM | POA: Diagnosis not present

## 2017-11-10 ENCOUNTER — Ambulatory Visit (HOSPITAL_COMMUNITY): Payer: BLUE CROSS/BLUE SHIELD | Admitting: Psychiatry

## 2017-11-10 DIAGNOSIS — F411 Generalized anxiety disorder: Secondary | ICD-10-CM

## 2017-11-10 NOTE — Progress Notes (Signed)
Patient:  Virginia Trujillo   DOB: 05/03/1966      MR Number: [id number]          Location: [facility]:  [address] [place],  [place], [address]    Start: Tuesday 11/10/2016 11:16 AM  End: Tuesday 11/10/2016  12:00 PM    Provider/Observer:     Florencia Reasons, MSW, LCSW    Chief Complaint:                Anxiety   Reason For Service:   Patient presents with symptoms of anxiety that have been lilfelong per patient's report but symptoms worsened when her mother died 15 years ago. Also during the same 2 month period around her mother's death,her grandparents and her sister died. Patient states being nervous and not wanting to be around people. She says she just feels safer when she locks the door and she sleeps with  the light on. She also reports having panic attacks every other day over simple things like things not going right. She fears been around people and worries something bad is going to happen. Current stressors include her father being diagnosed with prostate cancer about a month ago, her brother became blind this year due to diabetes and attempted suicide about 3 months ago,. Patient reports seeing a psyhiatrist once in early adolescence due to isolative behaviors and anxiety.  She recently began seeing psychiatrist Dr. Tenny Craw. She reports no previous involvement in outpatient therapy. Patient reports no psychiatric hospitalizations.          Interventions Strategy:  Supportive   Participation Level:   Active  Participation Quality:  Appropriate      Behavioral Observation:  Casual, Alert, and Appropirate  Current Psychosocial Factors:   Content of Session:    reviewed symptoms, administered GAD-7discussed stressors,  facilitated expression of thoughts and feelings, validated feelings, assisted patient identify triggers  of increased symptoms of anxiety and depression, praised and reinforced patient's use of helpful coping techniques, reviewed and revised treatment plan,  reviewed relaxation techniques, assigned patient to practice relaxation technique daily  Current Status:   Anxiety, excessive worry  Suicidal/Homicidal:   No  Patient Progress:    Patient last was seen about 2 weeks ago. She reports increased sleep difficulty and fatigue to due waking up several times during the  night in pain. She is scheduled to see her doctor tomorrow and hopes to receive another cortisone injection. Patient reports increased irritability and isolative behaviors. She reports having thoughts of death once last week as she was overwhelmed with pain and anxiety. She denies any plan or intent. She reports reading information handouts from previous sessions and engaging in other activities helped her cope. Patient reports continued tendency to worry about a variety of things and says anxiety is worse when in social situations.  Target Goals:   1.  Learn and implement calming skills to manage and reduce overall anxiety.      2. Identify, challenge, and replace biased, fearful self talk with positive, realistic, and empowering self talk.  Last Reviewed:   11/10/2017  Goals Addressed Today:    1,2,  Plan:      Return in 2 weeks  Impression/Diagnosis:   Patient presents with symptoms of anxiety that have been lilfelong per patient's report but symptoms worsened when her mother died 15 years ago. Also during the same 2 month period around her mother's death,her grandparents and her sister died. Symptoms include isolative behaviors,  panic attacks, nervousness, sleep difficulty, memory difficulty, persistent worry, daily crying spells, depressed mood  Diagnosis:  Axis I: Generalized anxiety disorder          Axis II: Deferred   Shakela Donati, LCSW 11/10/2017

## 2017-11-11 DIAGNOSIS — M25511 Pain in right shoulder: Secondary | ICD-10-CM | POA: Diagnosis not present

## 2017-11-11 DIAGNOSIS — Z6841 Body Mass Index (BMI) 40.0 and over, adult: Secondary | ICD-10-CM | POA: Diagnosis not present

## 2017-11-11 DIAGNOSIS — Z1389 Encounter for screening for other disorder: Secondary | ICD-10-CM | POA: Diagnosis not present

## 2017-11-11 DIAGNOSIS — R51 Headache: Secondary | ICD-10-CM | POA: Diagnosis not present

## 2017-11-11 DIAGNOSIS — M7552 Bursitis of left shoulder: Secondary | ICD-10-CM | POA: Diagnosis not present

## 2017-11-18 ENCOUNTER — Ambulatory Visit (HOSPITAL_COMMUNITY): Payer: BLUE CROSS/BLUE SHIELD | Admitting: Psychiatry

## 2017-11-18 ENCOUNTER — Encounter (HOSPITAL_COMMUNITY): Payer: Self-pay | Admitting: Psychiatry

## 2017-11-18 VITALS — BP 137/69 | HR 67 | Resp 18 | Wt 252.2 lb

## 2017-11-18 DIAGNOSIS — F411 Generalized anxiety disorder: Secondary | ICD-10-CM | POA: Diagnosis not present

## 2017-11-18 DIAGNOSIS — Z9989 Dependence on other enabling machines and devices: Secondary | ICD-10-CM

## 2017-11-18 DIAGNOSIS — M542 Cervicalgia: Secondary | ICD-10-CM

## 2017-11-18 DIAGNOSIS — F339 Major depressive disorder, recurrent, unspecified: Secondary | ICD-10-CM | POA: Diagnosis not present

## 2017-11-18 DIAGNOSIS — G8929 Other chronic pain: Secondary | ICD-10-CM | POA: Diagnosis not present

## 2017-11-18 MED ORDER — TRAZODONE HCL 100 MG PO TABS: 100.0000 mg | ORAL_TABLET | Freq: Every day | ORAL | 2 refills | Status: DC

## 2017-11-18 MED ORDER — ARIPIPRAZOLE 2 MG PO TABS: 2.0000 mg | ORAL_TABLET | ORAL | 2 refills | Status: DC

## 2017-11-18 MED ORDER — ALPRAZOLAM 1 MG PO TABS: 1.0000 mg | ORAL_TABLET | Freq: Three times a day (TID) | ORAL | 2 refills | Status: DC | PRN

## 2017-11-18 MED ORDER — DULOXETINE HCL 60 MG PO CPEP: 60.0000 mg | ORAL_CAPSULE | Freq: Two times a day (BID) | ORAL | 2 refills | Status: DC

## 2017-11-18 NOTE — Progress Notes (Signed)
BH MD/PA/NP OP Progress Note  11/18/2017 11:22 AM Virginia Trujillo  MRN:  [id number]  Chief Complaint:  Chief Complaint    Depression; Anxiety; Follow-up     HPI: This patient is a 52 year old married black female who [place]  [place]. 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  The patient was referred by her primary care physician, Dr. Phillips OdorGolding, for further assessment and treatment of anxiety and depression.  The patient states that she's always been a somewhat anxious person. However her depression seriously worsened about 10 years ago. Her grandmother mother and father died all within a few months of each other. She was very close to all of these family members but particular to her mother. She became more depressed and anxious. She's had an increasingly difficult time working. She is to work in some sort of Designer, fashion/clothingclothing factory but became very anxious being around people. Sometimes she would just walk off the job and not be able to handle it. At this point she spends most of her time in her house. She can't go out without a family member going with her. She recently went on a cruise with her family and had a horrible time because she was so anxious. She is on Cymbalta 60 mg but doesn't take it consistently and I explained it will not work unless it's taken daily. She's never been on any other medications for anxiety but has tried Lexapro without success.  Currently the patient feels depressed and anxious. She is not really enjoying her life. She goes to church but is too nervous to participate much. She cries on an almost daily basis. She has chronic back pain. She complains of difficulty with short-term memory and cannot remember things without her husband reminding her. She is extremely self-conscious and when she goes out she feels like people are watching her or judging her. Her sleep is poor and she only sleeps about 4  hours a night despite having sleep apnea and using a CPAP machine. She does not use drugs or alcohol and does not have auditory or visual hallucinations paranoia suicidal or homicidal ideation. She has panic attacks on a daily basis  The patient returns after 3 months.  She is generally doing better but has had some difficult things happen.  Her younger son who is [id number] recently went through a bout of spinal meningitis and was hospitalized for 2 weeks.  This was very stressful for her but he is doing better now.  She still dealing with neck and shoulder pain at night and this is made it sometimes difficult for her to sleep but the trazodone helps.  Her mood is generally good and the medications seem to be helping and Xanax continues to help her anxiety.  She is still very active and coming here to therapy and trying to utilize her relaxation skills.  Visit Diagnosis:    ICD-10-CM   1. Generalized anxiety disorder F41.1     Past Psychiatric History: none  Past Medical History:  Past Medical History:  Diagnosis Date  . Acid reflux   . Anxiety   . Depression   . Fibromyalgia     Past Surgical History:  Procedure Laterality Date  . CESAREAN SECTION    . MOUTH SURGERY    . tubes tied      Family Psychiatric History: See below  Family History:  Family History  Problem Relation  Age of Onset  . Hypertension Father   . Hypertension Brother   . Diabetes Brother   . Depression Brother   . Anxiety disorder Brother   . Stroke Maternal Grandmother   . Stroke Paternal Grandfather   . Congestive Heart Failure Sister   . Diabetes Sister   . Hypertension Sister   . Anxiety disorder Sister   . Depression Sister   . Heart disease Unknown   . Arthritis Unknown   . Cancer Unknown   . Asthma Unknown   . Diabetes Unknown   . Kidney disease Unknown     Social History:  Social History   Socioeconomic History  . Marital status: Married    Spouse name: Not on file  . Number of children: 2   . Years of education: 35  . Highest education level: Not on file  Occupational History  . Occupation: none    Employer: UNEMPLOYED  . Occupation: Unemployed   Social Needs  . Financial resource strain: Not on file  . Food insecurity:    Worry: Not on file    Inability: Not on file  . Transportation needs:    Medical: Not on file    Non-medical: Not on file  Tobacco Use  . Smoking status: Never Smoker  . Smokeless tobacco: Never Used  Substance and Sexual Activity  . Alcohol use: No  . Drug use: No  . Sexual activity: Yes    Birth control/protection: Surgical  Lifestyle  . Physical activity:    Days per week: Not on file    Minutes per session: Not on file  . Stress: Not on file  Relationships  . Social connections:    Talks on phone: Not on file    Gets together: Not on file    Attends religious service: Not on file    Active member of club or organization: Not on file    Attends meetings of clubs or organizations: Not on file    Relationship status: Not on file  Other Topics Concern  . Not on file  Social History Narrative   Patient lives at home with husband and son.    Patient does not work   Patient has a high school education    Patient has 2 children.    Allergies:  Allergies  Allergen Reactions  . Prednisone Other (See Comments)    Depression and crying  . Ambien [Zolpidem Tartrate] Other (See Comments)    hallucinations  . Penicillins     Metabolic Disorder Labs: No results found for: HGBA1C, MPG No results found for: PROLACTIN No results found for: CHOL, TRIG, HDL, CHOLHDL, VLDL, LDLCALC Lab Results  Component Value Date   TSH 3.190 11/29/2013    Therapeutic Level Labs: No results found for: LITHIUM No results found for: VALPROATE No components found for:  CBMZ  Current Medications: Current Outpatient Medications  Medication Sig Dispense Refill  . Acetaminophen (TYLENOL 8 HOUR PO) Take by mouth.      Marland Kitchen albuterol (PROVENTIL) (2.5  MG/3ML) 0.083% nebulizer solution Take 2.5 mg by nebulization every 6 (six) hours as needed for wheezing or shortness of breath.    . ALPRAZolam (XANAX) 1 MG tablet Take 1 tablet (1 mg total) by mouth 3 (three) times daily as needed for anxiety. 90 tablet 2  . ARIPiprazole (ABILIFY) 2 MG tablet Take 1 tablet (2 mg total) by mouth every morning. 30 tablet 2  . baclofen (LIORESAL) 10 MG tablet Take 10 mg by mouth  3 (three) times daily.    . Cholecalciferol (VITAMIN D PO) Take by mouth.    . doxycycline (VIBRAMYCIN) 100 MG capsule Take 100 mg by mouth 2 (two) times daily.    . DULoxetine (CYMBALTA) 60 MG capsule Take 1 capsule (60 mg total) by mouth 2 (two) times daily. 30 capsule 2  . gabapentin (NEURONTIN) 600 MG tablet daily  1  . meloxicam (MOBIC) 15 MG tablet Take 15 mg by mouth daily.      . montelukast (SINGULAIR) 10 MG tablet Take 10 mg by mouth at bedtime.    Marland Kitchen omeprazole (PRILOSEC) 40 MG capsule Take 1 capsule (40 mg total) by mouth daily. Appointment needed for further refills 30 capsule 3  . SUMAtriptan (IMITREX) 25 MG tablet Take 25 mg by mouth every 2 (two) hours as needed for migraine. May repeat in 2 hours if headache persists or recurs.    . traMADol (ULTRAM-ER) 300 MG 24 hr tablet Take 300 mg by mouth daily.    . traZODone (DESYREL) 100 MG tablet Take 1 tablet (100 mg total) by mouth at bedtime. 30 tablet 2   No current facility-administered medications for this visit.      Musculoskeletal: Strength & Muscle Tone: within normal limits Gait & Station: normal Patient leans: N/A  Psychiatric Specialty Exam: Review of Systems  Musculoskeletal: Positive for joint pain, myalgias and neck pain.  Neurological: Positive for headaches.  All other systems reviewed and are negative.   Blood pressure 137/69, pulse 67, resp. rate 18, weight 252 lb 3.2 oz (114.4 kg).Body mass index is 44.68 kg/m.  General Appearance: Casual, Neat and Well Groomed  Eye Contact:  Good  Speech:  Clear  and Coherent  Volume:  Normal  Mood:  Euthymic  Affect:  Congruent  Thought Process:  Goal Directed  Orientation:  Full (Time, Place, and Person)  Thought Content: Rumination   Suicidal Thoughts:  No  Homicidal Thoughts:  No  Memory:  Immediate;   Good Recent;   Good Remote;   Fair  Judgement:  Fair  Insight:  Fair  Psychomotor Activity:  Normal  Concentration:  Concentration: Fair and Attention Span: Fair  Recall:  Good  Fund of Knowledge: Fair  Language: Good  Akathisia:  No  Handed:  Right  AIMS (if indicated): not done  Assets:  Communication Skills Desire for Improvement Resilience Social Support Talents/Skills  ADL's:  Intact  Cognition: WNL  Sleep:  Fair   Screenings: GAD-7     Counselor from 11/10/2017 [facility]  Total GAD-7 Score  19       Assessment and Plan: This patient is a 52 year old female with a history of depression and anxiety.  She seems to be doing better.  She will continue trazodone 100 mg at bedtime for sleep, Cymbalta 60 mg twice a day for depression, Abilify 2 mg daily for augmentation and Xanax 1 mg 3 times daily as needed for anxiety.  She will return to see me in 3 months.   Diannia Ruder, MD 11/18/2017, 11:22 AM

## 2017-11-24 ENCOUNTER — Ambulatory Visit (HOSPITAL_COMMUNITY): Payer: Self-pay | Admitting: Psychiatry

## 2017-11-29 DIAGNOSIS — M545 Low back pain: Secondary | ICD-10-CM | POA: Diagnosis not present

## 2017-12-11 DIAGNOSIS — M1991 Primary osteoarthritis, unspecified site: Secondary | ICD-10-CM | POA: Diagnosis not present

## 2017-12-11 DIAGNOSIS — Z1389 Encounter for screening for other disorder: Secondary | ICD-10-CM | POA: Diagnosis not present

## 2017-12-11 DIAGNOSIS — R7309 Other abnormal glucose: Secondary | ICD-10-CM | POA: Diagnosis not present

## 2017-12-11 DIAGNOSIS — I10 Essential (primary) hypertension: Secondary | ICD-10-CM | POA: Diagnosis not present

## 2017-12-11 DIAGNOSIS — E782 Mixed hyperlipidemia: Secondary | ICD-10-CM | POA: Diagnosis not present

## 2017-12-11 DIAGNOSIS — D508 Other iron deficiency anemias: Secondary | ICD-10-CM | POA: Diagnosis not present

## 2017-12-11 DIAGNOSIS — Z6841 Body Mass Index (BMI) 40.0 and over, adult: Secondary | ICD-10-CM | POA: Diagnosis not present

## 2017-12-11 DIAGNOSIS — M797 Fibromyalgia: Secondary | ICD-10-CM | POA: Diagnosis not present

## 2017-12-11 DIAGNOSIS — E559 Vitamin D deficiency, unspecified: Secondary | ICD-10-CM | POA: Diagnosis not present

## 2017-12-21 DIAGNOSIS — E538 Deficiency of other specified B group vitamins: Secondary | ICD-10-CM | POA: Diagnosis not present

## 2017-12-28 ENCOUNTER — Ambulatory Visit (HOSPITAL_COMMUNITY): Payer: BLUE CROSS/BLUE SHIELD | Admitting: Psychiatry

## 2017-12-28 DIAGNOSIS — F411 Generalized anxiety disorder: Secondary | ICD-10-CM | POA: Diagnosis not present
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
## 2017-12-30 DIAGNOSIS — M545 Low back pain: Secondary | ICD-10-CM | POA: Diagnosis not present

## 2018-01-18 ENCOUNTER — Ambulatory Visit (HOSPITAL_COMMUNITY): Payer: Self-pay | Admitting: Psychiatry

## 2018-01-25 DIAGNOSIS — D510 Vitamin B12 deficiency anemia due to intrinsic factor deficiency: Secondary | ICD-10-CM | POA: Diagnosis not present

## 2018-01-28 DIAGNOSIS — M25511 Pain in right shoulder: Secondary | ICD-10-CM | POA: Diagnosis not present

## 2018-01-28 DIAGNOSIS — Z6841 Body Mass Index (BMI) 40.0 and over, adult: Secondary | ICD-10-CM | POA: Diagnosis not present

## 2018-01-28 DIAGNOSIS — M1991 Primary osteoarthritis, unspecified site: Secondary | ICD-10-CM | POA: Diagnosis not present

## 2018-01-28 DIAGNOSIS — Z1389 Encounter for screening for other disorder: Secondary | ICD-10-CM | POA: Diagnosis not present

## 2018-01-30 DIAGNOSIS — M545 Low back pain: Secondary | ICD-10-CM | POA: Diagnosis not present

## 2018-02-01 ENCOUNTER — Encounter (HOSPITAL_COMMUNITY): Payer: Self-pay | Admitting: Psychiatry

## 2018-02-01 ENCOUNTER — Ambulatory Visit (HOSPITAL_COMMUNITY): Payer: BLUE CROSS/BLUE SHIELD | Admitting: Psychiatry

## 2018-02-01 DIAGNOSIS — F411 Generalized anxiety disorder: Secondary | ICD-10-CM | POA: Diagnosis not present
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
## 2018-02-18 ENCOUNTER — Ambulatory Visit (HOSPITAL_COMMUNITY): Payer: Self-pay | Admitting: Psychiatry

## 2018-02-18 ENCOUNTER — Ambulatory Visit (HOSPITAL_COMMUNITY): Payer: BLUE CROSS/BLUE SHIELD | Admitting: Psychiatry

## 2018-02-18 ENCOUNTER — Encounter (HOSPITAL_COMMUNITY): Payer: Self-pay | Admitting: Psychiatry

## 2018-02-18 VITALS — BP 134/75 | HR 81 | Ht 63.0 in | Wt 258.0 lb

## 2018-02-18 DIAGNOSIS — F411 Generalized anxiety disorder: Secondary | ICD-10-CM | POA: Diagnosis not present

## 2018-02-18 MED ORDER — TRAZODONE HCL 100 MG PO TABS: 100.0000 mg | ORAL_TABLET | Freq: Every day | ORAL | 2 refills | Status: DC

## 2018-02-18 MED ORDER — ARIPIPRAZOLE 2 MG PO TABS: 2.0000 mg | ORAL_TABLET | ORAL | 2 refills | Status: DC

## 2018-02-18 MED ORDER — DULOXETINE HCL 60 MG PO CPEP: 60.0000 mg | ORAL_CAPSULE | Freq: Two times a day (BID) | ORAL | 2 refills | Status: DC

## 2018-02-18 MED ORDER — ALPRAZOLAM 1 MG PO TABS: 1.0000 mg | ORAL_TABLET | Freq: Three times a day (TID) | ORAL | 2 refills | Status: DC | PRN
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
## 2018-02-22 ENCOUNTER — Ambulatory Visit (HOSPITAL_COMMUNITY): Payer: BLUE CROSS/BLUE SHIELD | Admitting: Psychiatry

## 2018-02-22 ENCOUNTER — Encounter

## 2018-03-01 DIAGNOSIS — M545 Low back pain: Secondary | ICD-10-CM | POA: Diagnosis not present

## 2018-04-15 ENCOUNTER — Encounter (HOSPITAL_COMMUNITY): Payer: Self-pay | Admitting: Psychiatry

## 2018-04-15 ENCOUNTER — Ambulatory Visit (HOSPITAL_COMMUNITY): Payer: BLUE CROSS/BLUE SHIELD | Admitting: Psychiatry

## 2018-04-15 DIAGNOSIS — F411 Generalized anxiety disorder: Secondary | ICD-10-CM

## 2018-04-15 NOTE — Progress Notes (Signed)
Patient:  Virginia Trujillo   DOB: 07/10/1965      MR Number: [id number]          [facility]  [address]    [place],  [place], [id number]    Start: Thursday 04/15/2018 10:10 AM     End: Thursday 04/15/2018 11:10 AM     Provider/Observer:     Florencia ReasonsPeggy Eun Vermeer, MSW, LCSW    Chief Complaint:                Anxiety   Reason For Service:   Patient presents with symptoms of anxiety that have been lilfelong per patient's report but symptoms worsened when her mother died 15 years ago. Also during the same 2 month period around her mother's death,her grandparents and her sister died. Patient states being nervous and not wanting to be around people. She says she just feels safer when she locks the door and she sleeps with  the light on. She also reports having panic attacks every other day over simple things like things not going right. She fears been around people and worries something bad is going to happen. Current stressors include her father being diagnosed with prostate cancer about a month ago, her brother became blind this year due to diabetes and attempted suicide about 3 months ago,. Patient reports seeing a psyhiatrist once in early adolescence due to isolative behaviors and anxiety.  She recently began seeing psychiatrist Dr. Tenny Crawoss. She reports no previous involvement in outpatient therapy. Patient reports no psychiatric hospitalizations.          Interventions Strategy:  Supportive   Participation Level:   Active  Participation Quality:  Appropriate      Behavioral Observation:  Casual, Alert, and Appropirate  Current Psychosocial Factors:   Content of Session:    reviewed symptoms, discussed stressors,facilitated expression of thoughts and feelings, validated feelings, assisted patient identify her daily activities/responsibilities, discussed ways to increase nurturing activities and ways to soften or eliminate distressful activities ( schedule time for self, delegate  some of responsibilities, set realistic time frame to assist husband with dressing), assisted patient distinguish between thoughts and feelings, discussed how avoidance may be maintaining her anxiety and assisted patient identify ways she may be avoiding feelings associated with grief and loss, discussed possible effects on her body, discussed and practiced acceptance of feelings with compassion rather than judgment, normalized feelings related to grief and loss   Current Status:   Anxiety, excessive worry  Suicidal/Homicidal:   No  Patient Progress:    Patient last was seen about 2 months ago. She reports increased stress and anxiety since last session. Her dog has been ill and needs hernia repair. Her brother became ill and stopped breathing briefly while patient and her brother were transporting him to the hospital. He is better now and has returned home. Patient's husband fell at work in October and fractured his knee. He is out of work on [employer]'s comp and wearing a cast. Patient reports increased responsibilities as she has to do all the driving, do errands, and help husband dress. Patient also reports increased thoughts about deceased family members especially mother. She has been trying to push away thoughts and feelings about this. Patient reports difficulty staying asleep, increased frequency of migraines, excessive worry, and irritability. She continues to take care of grandson which has been nurturing for patient.  Target Goals:   1.  Learn and implement calming skills to manage and reduce overall anxiety.  2. Identify, challenge, and replace biased, fearful self talk with positive, realistic, and empowering self talk.  Last Reviewed:   02/01/2018  Goals Addressed Today:    1,2,  Plan:      Return in 2 weeks  Impression/Diagnosis:   Patient presents with symptoms of anxiety that have been lilfelong per patient's report but symptoms worsened when her mother died 15 years ago. Also  during the same 2 month period around her mother's death,her grandparents and her sister died. Symptoms include isolative behaviors, panic attacks, nervousness, sleep difficulty, memory difficulty, persistent worry, daily crying spells, depressed mood  Diagnosis:  Axis I: Generalized anxiety disorder          Axis II: Deferred   Virginia Oravec, LCSW 04/15/2018

## 2018-05-11 ENCOUNTER — Ambulatory Visit (HOSPITAL_COMMUNITY): Payer: Self-pay | Admitting: Psychiatry

## 2018-05-21 ENCOUNTER — Encounter (HOSPITAL_COMMUNITY): Payer: Self-pay | Admitting: Psychiatry

## 2018-05-21 ENCOUNTER — Ambulatory Visit (INDEPENDENT_AMBULATORY_CARE_PROVIDER_SITE_OTHER): Payer: BLUE CROSS/BLUE SHIELD | Admitting: Psychiatry

## 2018-05-21 VITALS — BP 134/76 | HR 68 | Ht 63.0 in | Wt 258.4 lb

## 2018-05-21 DIAGNOSIS — F411 Generalized anxiety disorder: Secondary | ICD-10-CM

## 2018-05-21 DIAGNOSIS — F321 Major depressive disorder, single episode, moderate: Secondary | ICD-10-CM

## 2018-05-21 MED ORDER — DULOXETINE HCL 60 MG PO CPEP: 60.0000 mg | ORAL_CAPSULE | Freq: Two times a day (BID) | ORAL | 2 refills | Status: DC

## 2018-05-21 MED ORDER — ALPRAZOLAM 1 MG PO TABS: 1.0000 mg | ORAL_TABLET | Freq: Three times a day (TID) | ORAL | 2 refills | Status: DC | PRN

## 2018-05-21 MED ORDER — ARIPIPRAZOLE 2 MG PO TABS: 2.0000 mg | ORAL_TABLET | ORAL | 2 refills | Status: DC

## 2018-05-21 MED ORDER — TEMAZEPAM 15 MG PO CAPS: 15.0000 mg | ORAL_CAPSULE | Freq: Every evening | ORAL | 2 refills | Status: DC | PRN

## 2018-05-21 NOTE — Progress Notes (Signed)
BH MD/PA/NP OP Progress Note  05/21/2018 10:17 AM LENOIR LEGG  MRN:  [id number]  Chief Complaint:  Chief Complaint    Depression; Anxiety; Follow-up     HPI: This patient is a 53 year old married black female who lives with her husband andsonin Elliott. 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  The patient was referred by her primary care physician, Dr. Phillips Odor, for further assessment and treatment of anxiety and depression.  The patient states that she's always been a somewhat anxious person. However her depression seriously worsened about 10 years ago. Her grandmother mother and father died all within a few months of each other. She was very close to all of these family members but particular to her mother. She became more depressed and anxious. She's had an increasingly difficult time working. She is to work in some sort of Designer, fashion/clothing but became very anxious being around people. Sometimes she would just walk off the job and not be able to handle it. At this point she spends most of her time in her house. She can't go out without a family member going with her. She recently went on a cruise with her family and had a horrible time because she was so anxious. She is on Cymbalta 60 mg but doesn't take it consistently and I explained it will not work unless it's taken daily. She's never been on any other medications for anxiety but has tried Lexapro without success.  Currently the patient feels depressed and anxious. She is not really enjoying her life. She goes to church but is too nervous to participate much. She cries on an almost daily basis. She has chronic back pain. She complains of difficulty with short-term memory and cannot remember things without her husband reminding her. She is extremely self-conscious and when she goes out she feels like people are watching her or judging her. Her sleep is poor and she only sleeps about 4  hours a night despite having sleep apnea and using a CPAP machine. She does not use drugs or alcohol and does not have auditory or visual hallucinations paranoia suicidal or homicidal ideation. She has panic attacks on a daily basis  The patient returns after 3 months.  She states she has been under more stress.  At the end of October her husband fell at work at [employer] and injured his knee.  He has been out on [employer].  She states her brother got sick and stopped breathing and had to be hospitalized but he is doing better now.  She states that her nerves were "torn up."  She has been working with Florencia Reasons on relaxation techniques.  She is doing better but still not able to sleep.  She only gets 4 to 5 hours of sleep at night with the trazodone.  Ambien caused her to sleep walk.  She denies serious depression or suicidal ideation.  The Xanax is helping her anxiety.  I suggested that we try another medication for sleep such as Restoril. Visit Diagnosis:    ICD-10-CM   1. Generalized anxiety disorder F41.1   2. 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 Acid reflux   . Anxiety   . Depression   . Fibromyalgia     Past Surgical History:  Procedure Laterality  Date  . CESAREAN SECTION    . MOUTH SURGERY    . tubes tied      Family Psychiatric History: See below  Family History:  Family History  Problem Relation Age of Onset  . Hypertension Father   . Hypertension Brother   . Diabetes Brother   . Depression Brother   . Anxiety disorder Brother   . Stroke Maternal Grandmother   . Stroke Paternal Grandfather   . Congestive Heart Failure Sister   . Diabetes Sister   . Hypertension Sister   . Anxiety disorder Sister   . Depression Sister   . Heart disease Unknown   . Arthritis Unknown   . Cancer Unknown   . Asthma Unknown   .  Diabetes Unknown   . Kidney disease Unknown     Social History:  Social History   Socioeconomic History  . Marital status: Married    Spouse name: Not on file  . Number of children: 2  . Years of education: 64  . Highest education level: Not on file  Occupational History  . Occupation: none    Employer: UNEMPLOYED  . Occupation: Unemployed   Social Needs  . Financial resource strain: Not on file  . Food insecurity:    Worry: Not on file    Inability: Not on file  . Transportation needs:    Medical: Not on file    Non-medical: Not on file  Tobacco Use  . Smoking status: Never Smoker  . Smokeless tobacco: Never Used  Substance and Sexual Activity  . Alcohol use: No  . Drug use: No  . Sexual activity: Yes    Birth control/protection: Surgical  Lifestyle  . Physical activity:    Days per week: Not on file    Minutes per session: Not on file  . Stress: Not on file  Relationships  . Social connections:    Talks on phone: Not on file    Gets together: Not on file    Attends religious service: Not on file    Active member of club or organization: Not on file    Attends meetings of clubs or organizations: Not on file    Relationship status: Not on file  Other Topics Concern  . Not on file  Social History Narrative   Patient lives at home with husband and son.    Patient does not work   Patient has a high school education    Patient has 2 children.    Allergies:  Allergies  Allergen Reactions  . Prednisone Other (See Comments)    Depression and crying  . Ambien [Zolpidem Tartrate] Other (See Comments)    hallucinations  . Penicillins     Metabolic Disorder Labs: No results found for: HGBA1C, MPG No results found for: PROLACTIN No results found for: CHOL, TRIG, HDL, CHOLHDL, VLDL, LDLCALC Lab Results  Component Value Date   TSH 3.190 11/29/2013    Therapeutic Level Labs: No results found for: LITHIUM No results found for: VALPROATE No components found  for:  CBMZ  Current Medications: Current Outpatient Medications  Medication Sig Dispense Refill  . Acetaminophen (TYLENOL 8 HOUR PO) Take by mouth.      Marland Kitchen albuterol (PROVENTIL) (2.5 MG/3ML) 0.083% nebulizer solution Take 2.5 mg by nebulization every 6 (six) hours as needed for wheezing or shortness of breath.    . ALPRAZolam (XANAX) 1 MG tablet Take 1 tablet (1 mg total) by mouth 3 (three) times daily as needed for  anxiety. 90 tablet 2  . ARIPiprazole (ABILIFY) 2 MG tablet Take 1 tablet (2 mg total) by mouth every morning. 30 tablet 2  . baclofen (LIORESAL) 10 MG tablet Take 10 mg by mouth 3 (three) times daily.    . Cholecalciferol (VITAMIN D PO) Take by mouth.    . doxycycline (VIBRAMYCIN) 100 MG capsule Take 100 mg by mouth 2 (two) times daily.    . DULoxetine (CYMBALTA) 60 MG capsule Take 1 capsule (60 mg total) by mouth 2 (two) times daily. 60 capsule 2  . gabapentin (NEURONTIN) 600 MG tablet daily  1  . meloxicam (MOBIC) 15 MG tablet Take 15 mg by mouth daily.      . montelukast (SINGULAIR) 10 MG tablet Take 10 mg by mouth at bedtime.    Marland Kitchen. omeprazole (PRILOSEC) 40 MG capsule Take 1 capsule (40 mg total) by mouth daily. Appointment needed for further refills 30 capsule 3  . SUMAtriptan (IMITREX) 25 MG tablet Take 25 mg by mouth every 2 (two) hours as needed for migraine. May repeat in 2 hours if headache persists or recurs.    . traMADol (ULTRAM-ER) 300 MG 24 hr tablet Take 300 mg by mouth daily.    . temazepam (RESTORIL) 15 MG capsule Take 1 capsule (15 mg total) by mouth at bedtime as needed for sleep. 30 capsule 2   No current facility-administered medications for this visit.      Musculoskeletal: Strength & Muscle Tone: within normal limits Gait & Station: normal Patient leans: N/A  Psychiatric Specialty Exam: Review of Systems  Musculoskeletal: Positive for back pain.  Psychiatric/Behavioral: The patient is nervous/anxious and has insomnia.   All other systems reviewed and  are negative.   Blood pressure 134/76, pulse 68, height 5\' 3"  (1.6 m), weight 258 lb 6.4 oz (117.2 kg), SpO2 100 %.Body mass index is 45.77 kg/m.  General Appearance: Casual, Neat and Well Groomed  Eye Contact:  Good  Speech:  Clear and Coherent  Volume:  Normal  Mood:  Anxious  Affect:  Appropriate and Congruent  Thought Process:  Goal Directed  Orientation:  Full (Time, Place, and Person)  Thought Content: Rumination   Suicidal Thoughts:  No  Homicidal Thoughts:  No  Memory:  Immediate;   Good Recent;   Good Remote;   Fair  Judgement:  Fair  Insight:  Fair  Psychomotor Activity:  Decreased  Concentration:  Concentration: Good and Attention Span: Good  Recall:  Good  Fund of Knowledge: Fair  Language: Good  Akathisia:  No  Handed:  Right  AIMS (if indicated): not done  Assets:  Communication Skills Desire for Improvement Resilience Social Support Talents/Skills  ADL's:  Intact  Cognition: WNL  Sleep:  Poor   Screenings: GAD-7     Counselor from 11/10/2017 in [facility]  Total GAD-7 Score  19       Assessment and Plan: This patient is a 53 year old female with a history of depression and anxiety.  She has had more stress recently and is not sleeping well.  She will discontinue trazodone and start Restoril 15 mg at bedtime as needed for sleep.  She will continue Xanax 1 mg 3 times daily as needed for anxiety, Cymbalta 60 mg twice daily for depression and Abilify 2 mg daily for augmentation.  She will return to see me in 3 months   Diannia Rudereborah , MD 05/21/2018, 10:17 AM

## 2018-05-27 ENCOUNTER — Ambulatory Visit (HOSPITAL_COMMUNITY): Payer: BLUE CROSS/BLUE SHIELD | Admitting: Psychiatry

## 2018-06-02 ENCOUNTER — Telehealth (HOSPITAL_COMMUNITY): Payer: Self-pay | Admitting: *Deleted

## 2018-06-02 DIAGNOSIS — Z6841 Body Mass Index (BMI) 40.0 and over, adult: Secondary | ICD-10-CM | POA: Diagnosis not present

## 2018-06-02 DIAGNOSIS — M797 Fibromyalgia: Secondary | ICD-10-CM | POA: Diagnosis not present

## 2018-06-02 DIAGNOSIS — G894 Chronic pain syndrome: Secondary | ICD-10-CM | POA: Diagnosis not present

## 2018-06-02 DIAGNOSIS — Z1389 Encounter for screening for other disorder: Secondary | ICD-10-CM | POA: Diagnosis not present

## 2018-06-02 NOTE — Telephone Encounter (Signed)
Dr Tenny Craw Rx Called patient is currently under care of pain management Dr Audrea Muscat. He has prescribed Narco 5-325 mg Q 8hr. He is aware that your are prescribing Alprazolam & Temazepam. And has told Rx & patient he will not order the Narco unless you discontinue 1 or both of the Benzodiazepine. Pharmacist requested a call back to discuss what you would like to do & to give you Dr Gwendalyn Ege #

## 2018-06-03 ENCOUNTER — Telehealth (HOSPITAL_COMMUNITY): Payer: Self-pay | Admitting: *Deleted

## 2018-06-03 NOTE — Telephone Encounter (Signed)
Please tell pharmacy to continue my meds as prescribed, patient has elected not to get pain meds from dr. Chipper Herb

## 2018-06-03 NOTE — Telephone Encounter (Signed)
Dr Tenny Craw Patient retuning your call # [phone] (H)

## 2018-06-09 ENCOUNTER — Ambulatory Visit (HOSPITAL_COMMUNITY): Payer: BLUE CROSS/BLUE SHIELD | Admitting: Psychiatry

## 2018-06-14 DIAGNOSIS — M79674 Pain in right toe(s): Secondary | ICD-10-CM | POA: Diagnosis not present

## 2018-06-14 DIAGNOSIS — Z79899 Other long term (current) drug therapy: Secondary | ICD-10-CM | POA: Diagnosis not present

## 2018-06-14 DIAGNOSIS — S92514A Nondisplaced fracture of proximal phalanx of right lesser toe(s), initial encounter for closed fracture: Secondary | ICD-10-CM | POA: Diagnosis not present

## 2018-06-14 DIAGNOSIS — Z880 Allergy status to penicillin: Secondary | ICD-10-CM | POA: Diagnosis not present

## 2018-06-14 DIAGNOSIS — S92501A Displaced unspecified fracture of right lesser toe(s), initial encounter for closed fracture: Secondary | ICD-10-CM | POA: Diagnosis not present

## 2018-06-14 DIAGNOSIS — S92511A Displaced fracture of proximal phalanx of right lesser toe(s), initial encounter for closed fracture: Secondary | ICD-10-CM | POA: Diagnosis not present

## 2018-06-14 DIAGNOSIS — K219 Gastro-esophageal reflux disease without esophagitis: Secondary | ICD-10-CM | POA: Diagnosis not present

## 2018-06-14 DIAGNOSIS — M797 Fibromyalgia: Secondary | ICD-10-CM | POA: Diagnosis not present

## 2018-06-14 DIAGNOSIS — W228XXA Striking against or struck by other objects, initial encounter: Secondary | ICD-10-CM | POA: Diagnosis not present

## 2018-06-14 DIAGNOSIS — E538 Deficiency of other specified B group vitamins: Secondary | ICD-10-CM | POA: Diagnosis not present

## 2018-06-14 DIAGNOSIS — Z888 Allergy status to other drugs, medicaments and biological substances status: Secondary | ICD-10-CM | POA: Diagnosis not present

## 2018-06-30 ENCOUNTER — Encounter (HOSPITAL_COMMUNITY): Payer: Self-pay | Admitting: Psychiatry

## 2018-06-30 ENCOUNTER — Ambulatory Visit (INDEPENDENT_AMBULATORY_CARE_PROVIDER_SITE_OTHER): Payer: BLUE CROSS/BLUE SHIELD | Admitting: Psychiatry

## 2018-06-30 DIAGNOSIS — F411 Generalized anxiety disorder: Secondary | ICD-10-CM | POA: Diagnosis not present

## 2018-06-30 NOTE — Progress Notes (Signed)
   THERAPIST PROGRESS NOTE  Session Time: Wednesday 06/30/2018 10:10 AM - 11:00 AM  Participation Level: Active  Behavioral Response: DisheveledLethargicAnxious and Depressed  Type of Therapy: Individual Therapy  Treatment Goals addressed: Learn and implement calming skills to manage and reduce overall anxiety so that it does not interfere with daily functioning,  Identify, challenge, and replace biased, fearful self talk with positive, realistic, and empowering self talk  Interventions: CBT and Supportive  Summary: Virginia Trujillo is a 53 y.o. female who presents with symptoms of anxiety that have been lilfelong per patient's report but symptoms worsened when her mother died 15+ years ago. Also during the same 2 month period around her mother's death,her grandparents and her sister died. Symptoms include isolative behaviors, panic attacks, nervousness, sleep difficulty, memory difficulty, persistent worry, daily crying spells, depressed mood.  Patient last was seen in December 2019. She reports managing holidays well after allowing herself to experience rather than avoid her feelings about losses. However, she reports increased stress in the past several weeks.This includes deaths of 3 acquaintances, father having "dizzy spells",injuring her foot,  and her dog continuing to have health issues. She reports crying spells, loss of interest in activities, sleep difficulty, depressed mood, nervousness, and negative thoughts about future and her ability to cope. She is particularly distraught today about her dog as she fears dog will die although vet has recommended procedures that will help dog per patient's report. She reports having passive SI yesterday and earlier this morning. She denies any plan or intent to harm self. She reports strong support from husband, sons, and siblings.     Suicidal/Homicidal: Patient denies current SI, any plan or intent to harm self.   Therapist Response: reviewed  symptoms, discussed stressors, facilitated expression of thoughts and feelings, validated feelings, discussed lapse versus relapse, assisted patient identify triggers of increased symptoms, assisted patient identify/challenge/and replace negative/catastrophizing thoughts with rational thoughts, assisted patient identify successful strategies she has used in the past to cope with adversity to address patient's pattern of underestimating her ability to cope, assisted patient  develop plan to increase behavioral activation based on activities consistent with her values ( do errands today with husband, socialize with family, and prepare to attend relative's wedding), reviewed relaxation techniques ( deep breathing, refocusing)  Plan: Return again in 2 weeks.  Diagnosis: Axis I: Generalized Anxiety Disorder    Axis II: No diagnosis    Adah Salvage, LCSW 06/30/2018

## 2018-07-13 ENCOUNTER — Ambulatory Visit (HOSPITAL_COMMUNITY): Payer: BLUE CROSS/BLUE SHIELD | Admitting: Psychiatry

## 2018-07-13 DIAGNOSIS — F411 Generalized anxiety disorder: Secondary | ICD-10-CM | POA: Diagnosis not present

## 2018-07-13 DIAGNOSIS — F321 Major depressive disorder, single episode, moderate: Secondary | ICD-10-CM

## 2018-07-13 NOTE — Progress Notes (Signed)
THERAPIST PROGRESS NOTE  Session Time: Tuesday 07/13/2018 10:19 AM - 11:00 AM  Participation Level: Active  Behavioral Response: DisheveledLethargicAnxious and Depressed,disheveled, tearful, sobbing at times  Type of Therapy: Individual Therapy  Treatment Goals addressed: Learn and implement calming skills to manage and reduce overall anxiety so that it does not interfere with daily functioning,  Identify, challenge, and replace biased, fearful self talk with positive, realistic, and empowering self talk  Interventions: CBT and Supportive  Summary: Virginia Trujillo is a 53 y.o. female who presents with symptoms of anxiety that have been lilfelong per patient's report but symptoms worsened when her mother died 15+ years ago. Also during the same 2 month period around her mother's death,her grandparents and her sister died. Symptoms include isolative behaviors, panic attacks, nervousness, sleep difficulty, memory difficulty, persistent worry, daily crying spells, depressed mood.  Patient last was seen about 2 weeks ago. She is seen emergently today as her patient found her dog dead at the foot of her bed this morning. She reports his condition worsened over the weekend. She is very distraught as she took dog to vet this passed weekend and was in the process of getting money for dog to have surgery. This triggered increased grief and loss issues about her deceased mother. Patient reports having suicidal ideations this morning with thoughts of pill overdose. During the course of today's session, patient reports suicidal thoughts subsided and denies any plans or intent to harm self.    Suicidal/Homicidal: Patient denies current SI, any plan or intent to harm self. Patient and husband agree to call this practice,call 911, or take patient to ER should symptoms worsen. Both are provided with crisis contact information.  Therapist Response: assisted patient practice deep breathing to regain composure,  reviewed symptoms, discussed stressors, facilitated expression of thoughts and feelings, validated feelings related to grief and loss, reviewed with patient successful efforts she has used in the past to cope with adversity, met with patient and husband and developed safety plan ( husband will keep and administer patient's medication, husband agrees to provide support and provide supervision for patient so she will not be alone, husband and patient agree to have established time to talk 1:00 pm daily and patient agrees to share concerns with husband at this time as well as any other times she needs to talk, patient and husband agree to plan activities to increase behavioral activation), discussed ways to improve self-care, facilitated scheduling appointment with psychiatrist Dr. Harrington Challenger, patient agrees to attend appointment tomorrow with Dr. Harrington Challenger  Plan: Return again in 2 weeks.  Diagnosis: Axis I: Generalized Anxiety Disorder    Axis II: No diagnosis    [name], LCSW 07/13/2018

## 2018-07-14 ENCOUNTER — Ambulatory Visit (HOSPITAL_COMMUNITY): Payer: BLUE CROSS/BLUE SHIELD | Admitting: Psychiatry

## 2018-07-14 ENCOUNTER — Encounter (HOSPITAL_COMMUNITY): Payer: Self-pay | Admitting: Psychiatry

## 2018-07-14 ENCOUNTER — Other Ambulatory Visit: Payer: Self-pay

## 2018-07-14 VITALS — BP 129/85 | HR 70 | Ht 63.0 in | Wt 254.0 lb

## 2018-07-14 DIAGNOSIS — F5105 Insomnia due to other mental disorder: Secondary | ICD-10-CM | POA: Diagnosis not present

## 2018-07-14 DIAGNOSIS — F321 Major depressive disorder, single episode, moderate: Secondary | ICD-10-CM | POA: Diagnosis not present

## 2018-07-14 DIAGNOSIS — Z79899 Other long term (current) drug therapy: Secondary | ICD-10-CM | POA: Diagnosis not present

## 2018-07-14 DIAGNOSIS — F411 Generalized anxiety disorder: Secondary | ICD-10-CM | POA: Diagnosis not present

## 2018-07-14 MED ORDER — TEMAZEPAM 30 MG PO CAPS: 30.0000 mg | ORAL_CAPSULE | Freq: Every evening | ORAL | 2 refills | Status: DC | PRN

## 2018-07-14 MED ORDER — DULOXETINE HCL 60 MG PO CPEP: 60.0000 mg | ORAL_CAPSULE | Freq: Two times a day (BID) | ORAL | 2 refills | Status: DC

## 2018-07-14 MED ORDER — ARIPIPRAZOLE 2 MG PO TABS: 2.0000 mg | ORAL_TABLET | ORAL | 2 refills | Status: DC

## 2018-07-14 MED ORDER — ALPRAZOLAM 1 MG PO TABS: 1.0000 mg | ORAL_TABLET | Freq: Three times a day (TID) | ORAL | 2 refills | Status: DC | PRN

## 2018-07-14 NOTE — Progress Notes (Signed)
BH MD/PA/NP OP Progress Note  07/14/2018 8:57 AM Virginia Trujillo  MRN:  [id number]  Chief Complaint:  Chief Complaint    Anxiety; Depression; Follow-up     HPI: This patient is a 53 year old married black female who lives with her husband andsonin [place]. 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  The patient was referred by her primary care physician, Dr. Phillips Odor, for further assessment and treatment of anxiety and depression.  The patient states that she's always been a somewhat anxious person. However her depression seriously worsened about 10 years ago. Her grandmother mother and father died all within a few months of each other. She was very close to all of these family members but particular to her mother. She became more depressed and anxious. She's had an increasingly difficult time working. She is to work in some sort of Designer, fashion/clothing but became very anxious being around people. Sometimes she would just walk off the job and not be able to handle it. At this point she spends most of her time in her house. She can't go out without a family member going with her. She recently went on a cruise with her family and had a horrible time because she was so anxious. She is on Cymbalta 60 mg but doesn't take it consistently and I explained it will not work unless it's taken daily. She's never been on any other medications for anxiety but has tried Lexapro without success.  Currently the patient feels depressed and anxious. She is not really enjoying her life. She goes to church but is too nervous to participate much. She cries on an almost daily basis. She has chronic back pain. She complains of difficulty with short-term memory and cannot remember things without her husband reminding her. She is extremely self-conscious and when she goes out she feels like people are watching her or judging her. Her sleep is poor and she only sleeps about 4  hours a night despite having sleep apnea and using a CPAP machine. She does not use drugs or alcohol and does not have auditory or visual hallucinations paranoia suicidal or homicidal ideation. She has panic attacks on a daily basis  The patient returns as a work in today.  She saw therapist Florencia Reasons yesterday also as a work in.  She states that over the last few weeks her Pekinese dog had been sick with a hernia.  His condition had worsened over the last few days and she and her husband brought him to the emergency vet over the weekend.  He was expected to get better but on 28-Sep-2022 night he died at the foot of her bed.  Since then she had become rather hysterical and unable to function.  She told her husband she was planning to take an overdose of her pills.  She spoke to the therapist yesterday and they worked out a Water engineer.  Her husband has control of all her medications and either here her son is with her at all times.  She was very attached to the dog but yet I explained to her the dogs do not live as long as people and we have to enjoy the time we have with them and then try to put it in perspective.  I encouraged her to handle this in a more mature way and she agrees.  She did not sleep well last night and I agreed to increase her sleep medication.  She denies suicidal ideation today. Visit Diagnosis:    ICD-10-CM   1. Generalized anxiety disorder F41.1   2. 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 Acid reflux   . Anxiety   . Depression   . Fibromyalgia     Past Surgical History:  Procedure Laterality Date  . CESAREAN SECTION    . MOUTH SURGERY    . tubes tied      Family Psychiatric History: see below  Family History:  Family History  Problem Relation Age of Onset  . Hypertension Father   . Hypertension Brother   . Diabetes  Brother   . Depression Brother   . Anxiety disorder Brother   . Stroke Maternal Grandmother   . Stroke Paternal Grandfather   . Congestive Heart Failure Sister   . Diabetes Sister   . Hypertension Sister   . Anxiety disorder Sister   . Depression Sister   . Heart disease Unknown   . Arthritis Unknown   . Cancer Unknown   . Asthma Unknown   . Diabetes Unknown   . Kidney disease Unknown     Social History:  Social History   Socioeconomic History  . Marital status: Married    Spouse name: Not on file  . Number of children: 2  . Years of education: 34  . Highest education level: Not on file  Occupational History  . Occupation: none    Employer: UNEMPLOYED  . Occupation: Unemployed   Social Needs  . Financial resource strain: Not on file  . Food insecurity:    Worry: Not on file    Inability: Not on file  . Transportation needs:    Medical: Not on file    Non-medical: Not on file  Tobacco Use  . Smoking status: Never Smoker  . Smokeless tobacco: Never Used  Substance and Sexual Activity  . Alcohol use: No  . Drug use: No  . Sexual activity: Yes    Birth control/protection: Surgical  Lifestyle  . Physical activity:    Days per week: Not on file    Minutes per session: Not on file  . Stress: Not on file  Relationships  . Social connections:    Talks on phone: Not on file    Gets together: Not on file    Attends religious service: Not on file    Active member of club or organization: Not on file    Attends meetings of clubs or organizations: Not on file    Relationship status: Not on file  Other Topics Concern  . Not on file  Social History Narrative   Patient lives at home with husband and son.    Patient does not work   Patient has a high school education    Patient has 2 children.    Allergies:  Allergies  Allergen Reactions  . Prednisone Other (See Comments)    Depression and crying  . Ambien [Zolpidem Tartrate] Other (See Comments)     hallucinations  . Penicillins     Metabolic Disorder Labs: No results found for: HGBA1C, MPG No results found for: PROLACTIN No results found for: CHOL, TRIG, HDL, CHOLHDL, VLDL, LDLCALC Lab Results  Component Value Date   TSH 3.190 11/29/2013    Therapeutic Level Labs: No results found for: LITHIUM No results found for: VALPROATE No components found  for:  CBMZ  Current Medications: Current Outpatient Medications  Medication Sig Dispense Refill  . Acetaminophen (TYLENOL 8 HOUR PO) Take by mouth.      Marland Kitchen albuterol (PROVENTIL) (2.5 MG/3ML) 0.083% nebulizer solution Take 2.5 mg by nebulization every 6 (six) hours as needed for wheezing or shortness of breath.    . ALPRAZolam (XANAX) 1 MG tablet Take 1 tablet (1 mg total) by mouth 3 (three) times daily as needed for anxiety. 90 tablet 2  . ARIPiprazole (ABILIFY) 2 MG tablet Take 1 tablet (2 mg total) by mouth every morning. 30 tablet 2  . baclofen (LIORESAL) 10 MG tablet Take 10 mg by mouth 3 (three) times daily.    . Cholecalciferol (VITAMIN D PO) Take by mouth.    . doxycycline (VIBRAMYCIN) 100 MG capsule Take 100 mg by mouth 2 (two) times daily.    . DULoxetine (CYMBALTA) 60 MG capsule Take 1 capsule (60 mg total) by mouth 2 (two) times daily. 60 capsule 2  . gabapentin (NEURONTIN) 600 MG tablet daily  1  . meloxicam (MOBIC) 15 MG tablet Take 15 mg by mouth daily.      . montelukast (SINGULAIR) 10 MG tablet Take 10 mg by mouth at bedtime.    Marland Kitchen omeprazole (PRILOSEC) 40 MG capsule Take 1 capsule (40 mg total) by mouth daily. Appointment needed for further refills 30 capsule 3  . SUMAtriptan (IMITREX) 25 MG tablet Take 25 mg by mouth every 2 (two) hours as needed for migraine. May repeat in 2 hours if headache persists or recurs.    . temazepam (RESTORIL) 15 MG capsule Take 1 capsule (15 mg total) by mouth at bedtime as needed for sleep. 30 capsule 2  . traMADol (ULTRAM-ER) 300 MG 24 hr tablet Take 300 mg by mouth daily.    . temazepam  (RESTORIL) 30 MG capsule Take 1 capsule (30 mg total) by mouth at bedtime as needed for sleep. 30 capsule 2   No current facility-administered medications for this visit.      Musculoskeletal: Strength & Muscle Tone: within normal limits Gait & Station: normal Patient leans: N/A  Psychiatric Specialty Exam: Review of Systems  Psychiatric/Behavioral: Positive for depression. The patient is nervous/anxious and has insomnia.   All other systems reviewed and are negative.   Blood pressure 129/85, pulse 70, height  (1.6 m), weight 254 lb (115.2 kg), SpO2 96 %.Body mass index is 44.99 kg/m.  General Appearance: Casual, Neat and Well Groomed  Eye Contact:  Fair  Speech:  Clear and Coherent  Volume:  Normal  Mood:  Anxious and Dysphoric  Affect:  Depressed and Tearful  Thought Process:  Goal Directed  Orientation:  Full (Time, Place, and Person)  Thought Content: Rumination   Suicidal Thoughts:  No  Homicidal Thoughts:  No  Memory:  Immediate;   Good Recent;   Good Remote;   Fair  Judgement:  Poor  Insight:  Shallow  Psychomotor Activity:  Decreased  Concentration:  Concentration: Fair and Attention Span: Fair  Recall:  Good  Fund of Knowledge: Fair  Language: Good  Akathisia:  No  Handed:  Right  AIMS (if indicated): not done  Assets:  Communication Skills Desire for Improvement Resilience Social Support  ADL's:  Intact  Cognition: WNL  Sleep:  Poor   Screenings: GAD-7     Counselor from 11/10/2017 [facility]  Total GAD-7 Score  19       Assessment and Plan: This  patient is a 53 year old female with a history of depression and anxiety.  She seems to have few internal resources to deal with loss.  However her histrionics are only going to make everything worse for her.  I encouraged to feel sad and morning but not to go the route of self-harm or totally falling to pieces.  She has plenty of support from family and  friends and she acknowledges this.  Currently she denies any thoughts of self-harm.  I have increased her Restoril to 30 mg at bedtime to help with sleep.  She will continue Cymbalta 60 mg twice daily for depression Abilify 2 mg daily for augmentation and Xanax 1 mg 3 times daily for anxiety.  She will return to see me in 4 weeks or call sooner if needed   Diannia Ruder, MD 07/14/2018, 8:57 AM

## 2018-07-29 ENCOUNTER — Ambulatory Visit (HOSPITAL_COMMUNITY): Payer: BLUE CROSS/BLUE SHIELD | Admitting: Psychiatry

## 2018-08-18 ENCOUNTER — Other Ambulatory Visit (HOSPITAL_COMMUNITY): Payer: Self-pay | Admitting: Psychiatry

## 2018-08-18 ENCOUNTER — Encounter (HOSPITAL_COMMUNITY): Payer: Self-pay | Admitting: Psychiatry

## 2018-08-18 ENCOUNTER — Ambulatory Visit (INDEPENDENT_AMBULATORY_CARE_PROVIDER_SITE_OTHER): Payer: BLUE CROSS/BLUE SHIELD | Admitting: Psychiatry

## 2018-08-18 ENCOUNTER — Other Ambulatory Visit: Payer: Self-pay

## 2018-08-18 DIAGNOSIS — F321 Major depressive disorder, single episode, moderate: Secondary | ICD-10-CM

## 2018-08-18 DIAGNOSIS — F411 Generalized anxiety disorder: Secondary | ICD-10-CM | POA: Diagnosis not present

## 2018-08-18 NOTE — Progress Notes (Signed)
Virtual Visit via Video Note  I connected with Virginia Trujillo on 08/18/18 at 11:00 AM EDT by a video enabled telemedicine application and verified that I am speaking with the correct person using two identifiers.   I discussed the limitations of evaluation and management by telemedicine and the availability of in person appointments. The patient expressed understanding and agreed to proceed.  I provided 30 minutes of non-face-to-face time during this encounter.   Adah Salvage, LCSW    THERAPIST PROGRESS NOTE  Session Time: Wednesday 08/18/2018 11:00 AM - 11:30 PM   Participation Level: Active  Behavioral Response: DisheveledLethargicAnxious and Depressed,disheveled, tearful, sobbing at times  Type of Therapy: Individual Therapy  Treatment Goals addressed: Learn and implement calming skills to manage and reduce overall anxiety so that it does not interfere with daily functioning,  Identify, challenge, and replace biased, fearful self talk with positive, realistic, and empowering self talk  Interventions: CBT and Supportive  Summary: Virginia Trujillo is a 53 y.o. female who presents with symptoms of anxiety that have been lilfelong per patient's report but symptoms worsened when her mother died 15+ years ago. Also during the same 2 month period around her mother's death,her grandparents and her sister died. Symptoms include isolative behaviors, panic attacks, nervousness, sleep difficulty, memory difficulty, persistent worry, daily crying spells, depressed mood.  Patient last was seen about 4 weeks ago. She reports feeling better and states taking medication for sleep as instructed by Dr. Tenny Craw has been helpful.  She reports coping much better with the loss of her dog. She still misses him and reports acknowledging her feelings. She reports having a crying episode Sunday but then becoming involved in other activities including enjoying time with her grandson. She also reports she now is able  to face reminders of her dog and still be able to function. She expresses increased acceptance of dog's death and states she is at peace. She is very pleased with way she has been coping with impact of coronavirus. She reports initially beginning to ruminate but noticing she was doing this and used thoughts stopping as well as behavioral activation. She limits her exposure to TV and news. She reports positive routine and daily structure. 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 Patient reports continued strong support from family.   Suicidal/Homicidal: None  Therapist Response: reviewed symptoms, praised and reinforced patient's use of helpful coping techniques/increased behavioral activation, discussed stressors, facilitated expression of thoughts and feelings, validated feelings, assisted patient identify and verbalize feelings regarding loss of her dog, assisted patient identify ways to maintain use of helpful coping techniques  Plan: Return again in 2 weeks.  Diagnosis: Axis I: Generalized Anxiety Disorder    Axis II: No diagnosis    Adah Salvage, LCSW 08/18/2018

## 2018-08-20 ENCOUNTER — Other Ambulatory Visit: Payer: Self-pay

## 2018-08-20 ENCOUNTER — Ambulatory Visit (INDEPENDENT_AMBULATORY_CARE_PROVIDER_SITE_OTHER): Payer: BLUE CROSS/BLUE SHIELD | Admitting: Psychiatry

## 2018-08-20 ENCOUNTER — Encounter (HOSPITAL_COMMUNITY): Payer: Self-pay | Admitting: Psychiatry

## 2018-08-20 DIAGNOSIS — Z79899 Other long term (current) drug therapy: Secondary | ICD-10-CM

## 2018-08-20 DIAGNOSIS — M549 Dorsalgia, unspecified: Secondary | ICD-10-CM | POA: Diagnosis not present

## 2018-08-20 DIAGNOSIS — F411 Generalized anxiety disorder: Secondary | ICD-10-CM

## 2018-08-20 DIAGNOSIS — F321 Major depressive disorder, single episode, moderate: Secondary | ICD-10-CM | POA: Diagnosis not present

## 2018-08-20 MED ORDER — DULOXETINE HCL 60 MG PO CPEP: 60.0000 mg | ORAL_CAPSULE | Freq: Two times a day (BID) | ORAL | 2 refills | Status: DC

## 2018-08-20 MED ORDER — TEMAZEPAM 30 MG PO CAPS: 30.0000 mg | ORAL_CAPSULE | Freq: Every evening | ORAL | 2 refills | Status: DC | PRN

## 2018-08-20 MED ORDER — ARIPIPRAZOLE 2 MG PO TABS: 2.0000 mg | ORAL_TABLET | ORAL | 2 refills | Status: DC

## 2018-08-20 MED ORDER — ALPRAZOLAM 1 MG PO TABS: 1.0000 mg | ORAL_TABLET | Freq: Three times a day (TID) | ORAL | 2 refills | Status: DC | PRN

## 2018-08-20 NOTE — Progress Notes (Signed)
Virtual Visit via Telephone Note  I connected with Virginia Trujillo on 08/20/18 at 10:00 AM EDT by telephone and verified that I am speaking with the correct person using two identifiers.   I discussed the limitations, risks, security and privacy concerns of performing an evaluation and management service by telephone and the availability of in person appointments. I also discussed with the patient that there may be a patient responsible charge related to this service. The patient expressed understanding and agreed to proceed.     I discussed the assessment and treatment plan with the patient. The patient was provided an opportunity to ask questions and all were answered. The patient agreed with the plan and demonstrated an understanding of the instructions.   The patient was advised to call back or seek an in-person evaluation if the symptoms worsen or if the condition fails to improve as anticipated.  I provided 15 minutes of non-face-to-face time during this encounter.   Diannia Rudereborah Zanaya Baize, MD  [facility] MD/PA/NP OP Progress Note  08/20/2018 10:29 AM Virginia Trujillo  MRN:  [id number]  Chief Complaint:  Chief Complaint    Depression; Anxiety; Follow-up     HPI: This patient is a 53 year old married black female who lives with her husband andsonin [place]. 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  The patient was referred by her primary care physician, Dr. Phillips OdorGolding, for further assessment and treatment of anxiety and depression.  The patient states that she's always been a somewhat anxious person. However her depression seriously worsened about 10 years ago. Her grandmother mother and father died all within a few months of each other. She was very close to all of these family members but particular to her mother. She became more depressed and anxious. She's had an increasingly difficult time working. She is to work in some sort of Designer, fashion/clothingclothing factory  but became very anxious being around people. Sometimes she would just walk off the job and not be able to handle it. At this point she spends most of her time in her house. She can't go out without a family member going with her. She recently went on a cruise with her family and had a horrible time because she was so anxious. She is on Cymbalta 60 mg but doesn't take it consistently and I explained it will not work unless it's taken daily. She's never been on any other medications for anxiety but has tried Lexapro without success.  Currently the patient feels depressed and anxious. She is not really enjoying her life. She goes to church but is too nervous to participate much. She cries on an almost daily basis. She has chronic back pain. She complains of difficulty with short-term memory and cannot remember things without her husband reminding her. She is extremely self-conscious and when she goes out she feels like people are watching her or judging her. Her sleep is poor and she only sleeps about 4 hours a night despite having sleep apnea and using a CPAP machine. She does not use drugs or alcohol and does not have auditory or visual hallucinations paranoia suicidal or homicidal ideation. She has panic attacks on a daily basis  Patient returns after 4 weeks.  Last time she was seen as a work in because she was very distraught over her dog's recent death.  She is assessed today via telephone interview due to the coronavirus pandemic.  She states that she is doing  much better.  She still having her moments of tearfulness over the dog but they are few and far between and she is able to recover.  She is sleeping better as long as she takes the Restoril.  Her anxiety is under decent control and she denies serious depression and she has had no further suicidal ideation.  She is continuing her therapy here with Florencia Reasons.  She is taking care of her 24-month-old grandson 3 days a week and this is keeping her  busy. Visit Diagnosis:    ICD-10-CM   1. Generalized anxiety disorder F41.1   2. 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 Acid reflux   . Anxiety   . Depression   . Fibromyalgia     Past Surgical History:  Procedure Laterality Date  . CESAREAN SECTION    . MOUTH SURGERY    . tubes tied      Family Psychiatric History: see below  Family History:  Family History  Problem Relation Age of Onset  . Hypertension Father   . Hypertension Brother   . Diabetes Brother   . Depression Brother   . Anxiety disorder Brother   . Stroke Maternal Grandmother   . Stroke Paternal Grandfather   . Congestive Heart Failure Sister   . Diabetes Sister   . Hypertension Sister   . Anxiety disorder Sister   . Depression Sister   . Heart disease Unknown   . Arthritis Unknown   . Cancer Unknown   . Asthma Unknown   . Diabetes Unknown   . Kidney disease Unknown     Social History:  Social History   Socioeconomic History  . Marital status: Married    Spouse name: Not on file  . Number of children: 2  . Years of education: 30  . Highest education level: Not on file  Occupational History  . Occupation: none    Employer: UNEMPLOYED  . Occupation: Unemployed   Social Needs  . Financial resource strain: Not on file  . Food insecurity:    Worry: Not on file    Inability: Not on file  . Transportation needs:    Medical: Not on file    Non-medical: Not on file  Tobacco Use  . Smoking status: Never Smoker  . Smokeless tobacco: Never Used  Substance and Sexual Activity  . Alcohol use: No  . Drug use: No  . Sexual activity: Yes    Birth control/protection: Surgical  Lifestyle  . Physical activity:    Days per week: Not on file    Minutes per session: Not on file  . Stress: Not on file  Relationships  . Social connections:    Talks  on phone: Not on file    Gets together: Not on file    Attends religious service: Not on file    Active member of club or organization: Not on file    Attends meetings of clubs or organizations: Not on file    Relationship status: Not on file  Other Topics Concern  . Not on file  Social History Narrative   Patient lives at home with husband and son.    Patient does not work   Patient has a high school education    Patient has 2 children.    Allergies:  Allergies  Allergen Reactions  . Prednisone  Other (See Comments)    Depression and crying  . Ambien [Zolpidem Tartrate] Other (See Comments)    hallucinations  . Penicillins     Metabolic Disorder Labs: No results found for: HGBA1C, MPG No results found for: PROLACTIN No results found for: CHOL, TRIG, HDL, CHOLHDL, VLDL, LDLCALC Lab Results  Component Value Date   TSH 3.190 11/29/2013    Therapeutic Level Labs: No results found for: LITHIUM No results found for: VALPROATE No components found for:  CBMZ  Current Medications: Current Outpatient Medications  Medication Sig Dispense Refill  . Acetaminophen (TYLENOL 8 HOUR PO) Take by mouth.      Marland Kitchen albuterol (PROVENTIL) (2.5 MG/3ML) 0.083% nebulizer solution Take 2.5 mg by nebulization every 6 (six) hours as needed for wheezing or shortness of breath.    . ALPRAZolam (XANAX) 1 MG tablet Take 1 tablet (1 mg total) by mouth 3 (three) times daily as needed for anxiety. 90 tablet 2  . ARIPiprazole (ABILIFY) 2 MG tablet Take 1 tablet (2 mg total) by mouth every morning. 30 tablet 2  . baclofen (LIORESAL) 10 MG tablet Take 10 mg by mouth 3 (three) times daily.    . Cholecalciferol (VITAMIN D PO) Take by mouth.    . doxycycline (VIBRAMYCIN) 100 MG capsule Take 100 mg by mouth 2 (two) times daily.    . DULoxetine (CYMBALTA) 60 MG capsule Take 1 capsule (60 mg total) by mouth 2 (two) times daily. 60 capsule 2  . gabapentin (NEURONTIN) 600 MG tablet daily  1  . meloxicam (MOBIC) 15  MG tablet Take 15 mg by mouth daily.      . montelukast (SINGULAIR) 10 MG tablet Take 10 mg by mouth at bedtime.    Marland Kitchen omeprazole (PRILOSEC) 40 MG capsule Take 1 capsule (40 mg total) by mouth daily. Appointment needed for further refills 30 capsule 3  . SUMAtriptan (IMITREX) 25 MG tablet Take 25 mg by mouth every 2 (two) hours as needed for migraine. May repeat in 2 hours if headache persists or recurs.    . temazepam (RESTORIL) 30 MG capsule Take 1 capsule (30 mg total) by mouth at bedtime as needed for sleep. 30 capsule 2  . traMADol (ULTRAM-ER) 300 MG 24 hr tablet Take 300 mg by mouth daily.     No current facility-administered medications for this visit.      Musculoskeletal: Strength & Muscle Tone: Not done, phone interview Gait & Station:  Patient leans:   Psychiatric Specialty Exam: Review of Systems  Musculoskeletal: Positive for back pain.  Psychiatric/Behavioral: The patient is nervous/anxious.   All other systems reviewed and are negative.   There were no vitals taken for this visit.There is no height or weight on file to calculate BMI.  General Appearance: NA  Eye Contact:  NA  Speech:  Clear and Coherent  Volume:  Normal  Mood:  Anxious  Affect:  NA  Thought Process:  Goal Directed  Orientation:  Full (Time, Place, and Person)  Thought Content: Rumination   Suicidal Thoughts:  No  Homicidal Thoughts:  No  Memory:  Immediate;   Good Recent;   Good Remote;   Fair  Judgement:  Fair  Insight:  Fair  Psychomotor Activity:  Normal  Concentration:  Concentration: Good and Attention Span: Good  Recall:  Good  Fund of Knowledge: Fair  Language: Good  Akathisia:  No  Handed:  Right  AIMS (if indicated): not done  Assets:  Communication Skills Desire for Improvement Resilience  Social Support Talents/Skills  ADL's:  Intact  Cognition: WNL  Sleep:  Good   Screenings: GAD-7     Counselor from 11/10/2017 in [facility]   Total GAD-7 Score  19       Assessment and Plan:  This patient is a 53 year old female with a history of depression and anxiety.  She is doing better with the help of the therapy.  She will continue Xanax 1 mg 3 times daily for anxiety, Abilify 2 mg every evening for augmentation and Cymbalta 60 mg twice daily for depression as well as temazepam 30 mg at bedtime for sleep.  She will return to see me in 2 months or call sooner if needed  Diannia Ruder, MD 08/20/2018, 10:29 AM

## 2018-09-01 ENCOUNTER — Other Ambulatory Visit: Payer: Self-pay

## 2018-09-01 ENCOUNTER — Ambulatory Visit (INDEPENDENT_AMBULATORY_CARE_PROVIDER_SITE_OTHER): Payer: BLUE CROSS/BLUE SHIELD | Admitting: Psychiatry

## 2018-09-01 ENCOUNTER — Encounter (HOSPITAL_COMMUNITY): Payer: Self-pay | Admitting: Psychiatry

## 2018-09-01 DIAGNOSIS — F411 Generalized anxiety disorder: Secondary | ICD-10-CM | POA: Diagnosis not present

## 2018-09-01 NOTE — Progress Notes (Signed)
Virtual Visit via Video Note  I connected with Virginia Trujillo on 09/01/18 at 10:00 AM EDT by a video enabled telemedicine application and verified that I am speaking with the correct person using two identifiers.   I discussed the limitations of evaluation and management by telemedicine and the availability of in person appointments. The patient expressed understanding and agreed to proceed.   I provided 50 minutes of non-face-to-face time during this encounter.   Adah Salvage, LCSW   THERAPIST PROGRESS NOTE  Session Time: Wednesday 09/01/2018 10:00 AM -10:50 AM   Participation Level: Active  Behavioral Response: less depressed, less anxious  Type of Therapy: Individual Therapy  Treatment Goals addressed: Learn and implement calming skills to manage and reduce overall anxiety so that it does not interfere with daily functioning,  Identify, challenge, and replace biased, fearful self talk with positive, realistic, and empowering self talk  Interventions: CBT and Supportive  Summary: Virginia Trujillo is a 53 y.o. female who presents with symptoms of anxiety that have been lilfelong per patient's report but symptoms worsened when her mother died 15+ years ago. Also during the same 2 month period around her mother's death,her grandparents and her sister died. Symptoms include isolative behaviors, panic attacks, nervousness, sleep difficulty, memory difficulty, persistent worry, daily crying spells, depressed mood.  Patient last's contact was by virtual visit via video 2 weeks ago. She reports continuing to miss her dog and says she acknowledges her feelings which helps. She then becomes involved in other activities. She is not babysitting her grandson as much as his mother no longer is working. She still sees him daily either through skype or in person. She reports sometimes being tired regarding restrictions related to coronavirus pandemic but is trying to focus on doing household projects.  She reports decreased anxiety and using coping skills.  Suicidal/Homicidal: None  Therapist Response: reviewed symptoms, praised and reinforced patient's use of helpful coping techniques/increased behavioral activation, discussed stressors, facilitated expression of thoughts and feelings, validated feelings, discussed integrated grief, encouraged patient to plan her day to maintain structure and behavioral activation, reviewed treatment plan, developed step down plan to termination to include 3 more sessions focusing on relapse prevention strategies and developing a mental health maintenance plan, also informed patient therapist will mail information on mindfulness for patient to review for next session.   Plan: Return again in 2- 3 weeks.  Diagnosis: Axis I: Generalized Anxiety Disorder    Axis II: No diagnosis    Adah Salvage, LCSW 09/01/2018

## 2018-09-08 DIAGNOSIS — G4733 Obstructive sleep apnea (adult) (pediatric): Secondary | ICD-10-CM | POA: Diagnosis not present

## 2018-09-20 ENCOUNTER — Ambulatory Visit (INDEPENDENT_AMBULATORY_CARE_PROVIDER_SITE_OTHER): Payer: BLUE CROSS/BLUE SHIELD | Admitting: Psychiatry

## 2018-09-20 ENCOUNTER — Other Ambulatory Visit: Payer: Self-pay

## 2018-09-20 DIAGNOSIS — F411 Generalized anxiety disorder: Secondary | ICD-10-CM | POA: Diagnosis not present

## 2018-09-20 NOTE — Progress Notes (Signed)
Virtual Visit via Telephone Note  I connected with Virginia Trujillo on 09/20/18 at  9:00 AM EDT by telephone and verified that I am speaking with the correct person using two identifiers.   I discussed the limitations, risks, security and privacy concerns of performing an evaluation and management service by telephone and the availability of in person appointments. I also discussed with the patient that there may be a patient responsible charge related to this service. The patient expressed understanding and agreed to proceed.  I provided 20 minutes of non-face-to-face time during this encounter.   Adah Salvage, LCSW                      THERAPIST PROGRESS NOTE  Session Time: Monday 09/20/2018 9:35 AM -  9:55 AM  Participation Level: Active  Behavioral Response: less depressed, less anxious  Type of Therapy: Individual Therapy  Treatment Goals addressed: Learn and implement calming skills to manage and reduce overall anxiety so that it does not interfere with daily functioning,  Identify, challenge, and replace biased, fearful self talk with positive, realistic, and empowering self talk  Interventions: CBT and Supportive  Summary: Virginia Trujillo is a 53 y.o. female who presents with symptoms of anxiety that have been lilfelong per patient's report but symptoms worsened when her mother died 15+ years ago. Also during the same 2 month period around her mother's death,her grandparents and her sister died. Symptoms include isolative behaviors, panic attacks, nervousness, sleep difficulty, memory difficulty, persistent worry, daily crying spells, depressed mood.  Patient last's contact was by virtual visit via video 3 weeks ago.  Today's visit is abbreviated as there was some difficulty connecting for virtual visit.  She reports continuing to miss her dog and couple of rough days but managing well.  She expresses worry about a niece who recently has been tested for COVID 19.  However, she  reports trying to and says she has been trying to use distracting activities.  She also reviewed handout on mindfulness and the window of tolerance and states trying not to think so much about the past or the future. She reports recently finding a meditation app on her phone and has started using at night. She is excited she got a new car. She also is happy about she and husband planning to move.   Suicidal/Homicidal: None  Therapist Response: reviewed symptoms, praised and reinforced patient's use of helpful coping techniques,   discussed stressors, facilitated expression of thoughts and feelings, validated feelings, began to discussed mindfulness including benefits of mindfulness, asked patient to continue to review handout for next session, encouraged patient to continue using a meditation app she has been using on her phone Plan: Return again in 2- 3 weeks.  Diagnosis: Axis I: Generalized Anxiety Disorder    Axis II: No diagnosis    Adah Salvage, LCSW 09/20/2018

## 2018-09-23 IMAGING — MR MR CERVICAL SPINE W/O CM
5 series · 31 of 48 positions shown · non-contrast
Comparison: Cervical spine radiograph 06/08/2017

Cervical spine MRI 09/29/2014

CLINICAL DATA: 2 months of right neck and arm pain, weakness, and
numbness. No specific injury, no cervical surgery,

EXAM:
MRI CERVICAL SPINE WITHOUT CONTRAST
TECHNIQUE: Multiplanar, multisequence MR imaging of the cervical spine was
performed. No intravenous contrast was administered.

[Series 3: T2 · sagittal · 3.0mm · 0.66mm/px · 6 of 12 slices shown (1 of 2)]
[im 1/12]
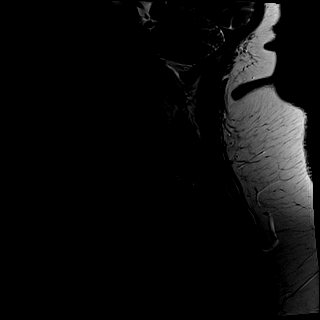
[im 3/12]
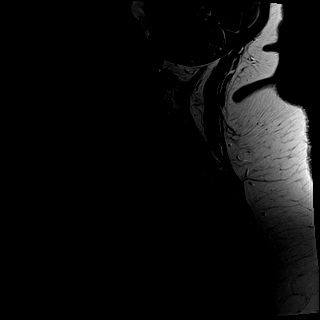
[im 5/12]
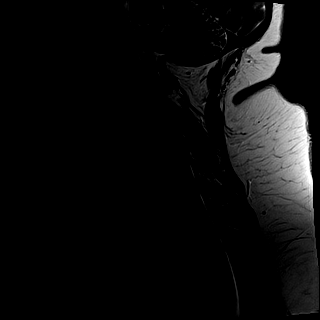
[im 7/12]
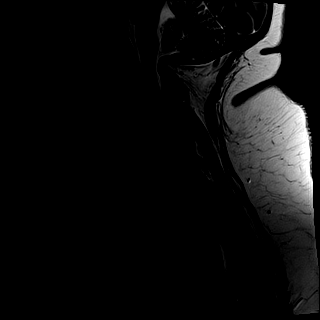
[im 9/12]
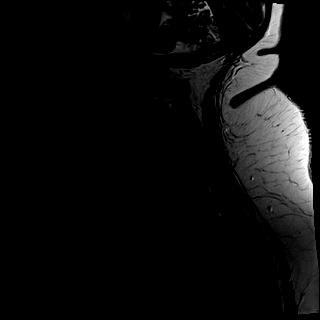
[im 12/12]
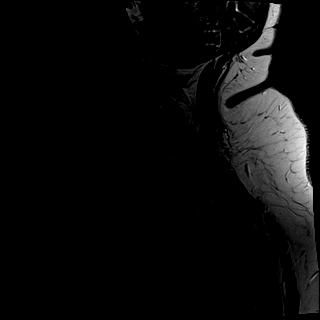

[Series 4: T1 · sagittal · 3.0mm · 0.66mm/px · 7 of 12 slices shown]
[im 1/12]
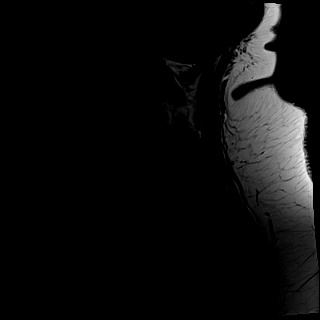
[im 2/12]
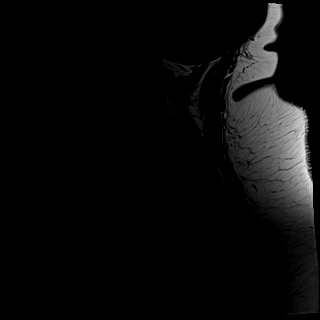
[im 4/12]
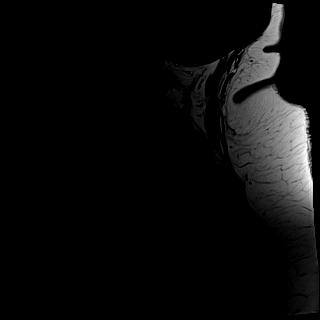
[im 6/12]
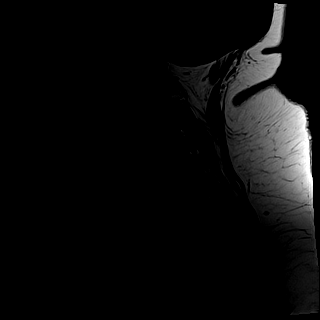
[im 8/12]
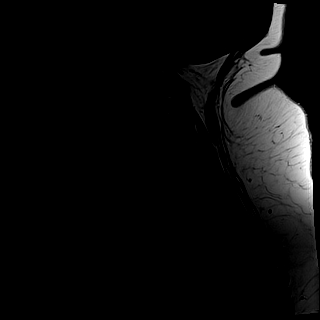
[im 10/12]
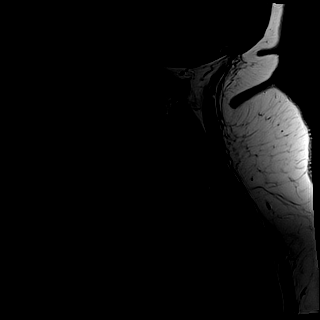
[im 12/12]
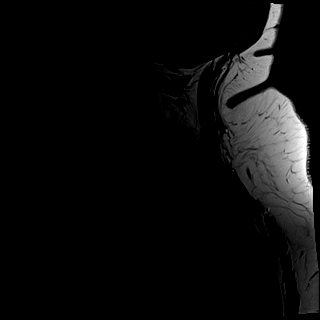

[Series 5: T2 · axial · 3.0mm · 0.70mm/px · z∈[-77,+14]mm · 8 of 26 slices shown (2 of 2)]
[im 1/26]
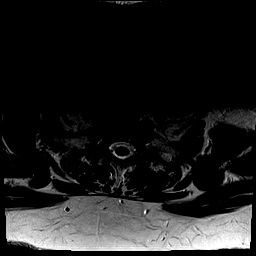
[im 4/26]
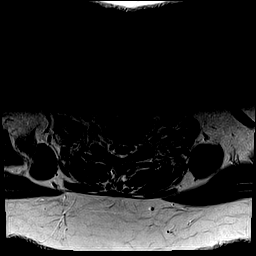
[im 8/26]
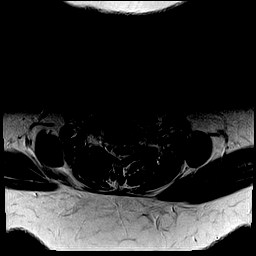
[im 12/26]
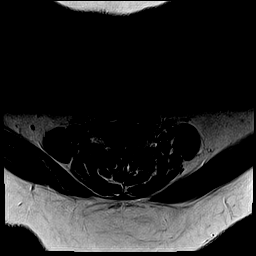
[im 14/26]
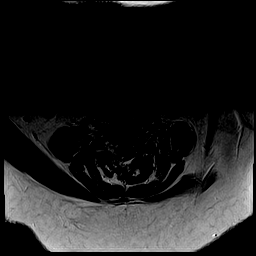
[im 18/26]
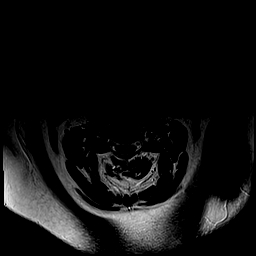
[im 22/26]
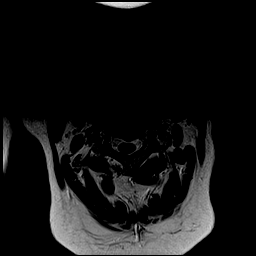
[im 26/26]
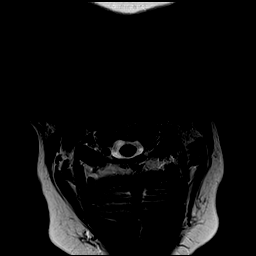

[Series 6: tir sag · sagittal · 3.0mm · 0.41mm/px · 7 of 12 slices shown]
[im 1/12]
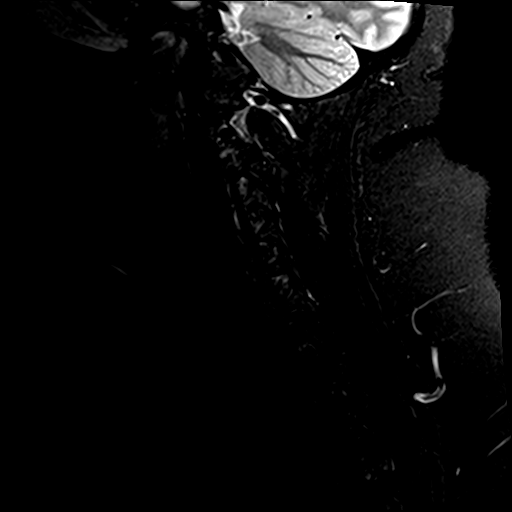
[im 2/12]
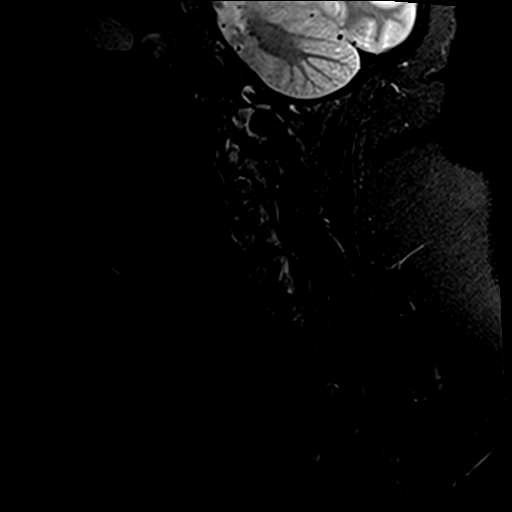
[im 4/12]
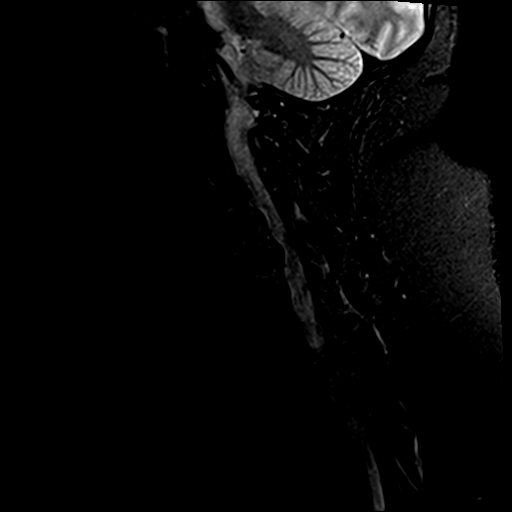
[im 6/12]
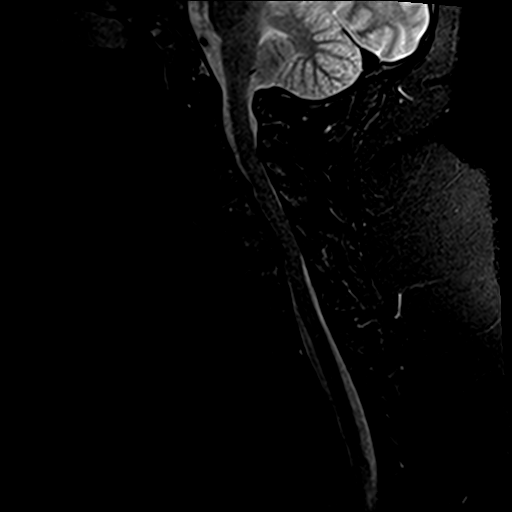
[im 8/12]
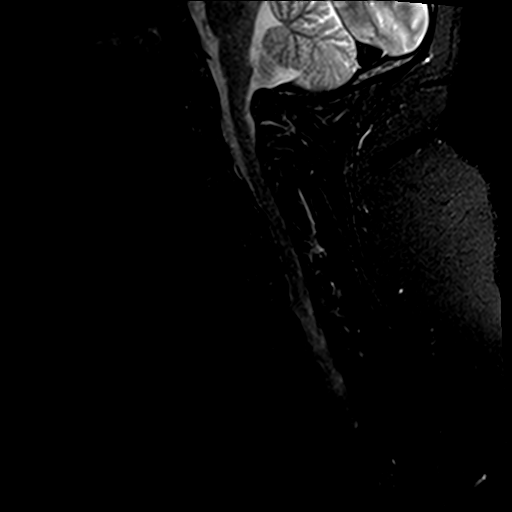
[im 10/12]
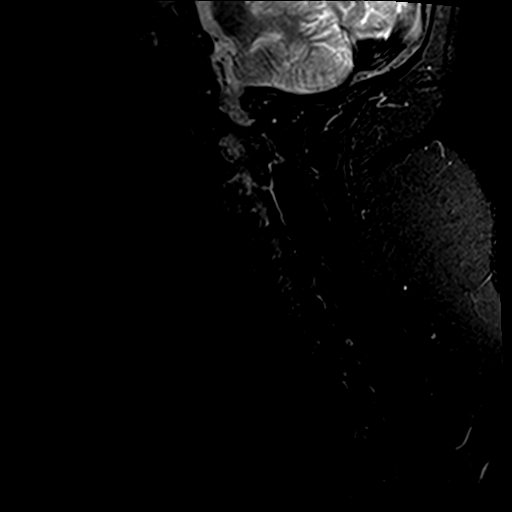
[im 12/12]
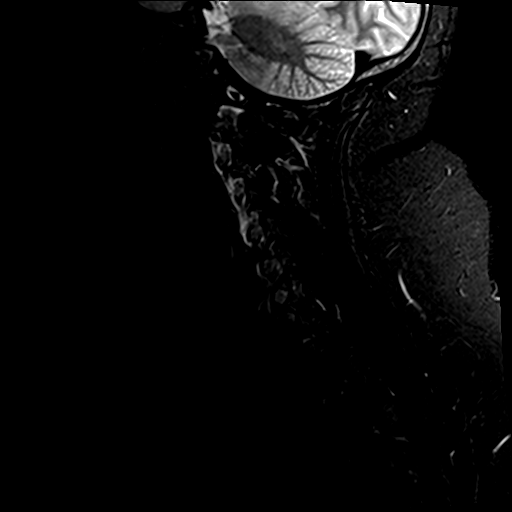

[Series 7: GRE · axial · 3.0mm · 0.35mm/px · z∈[-77,-52]mm · 3 of 26 slices shown]
[im 1/26]
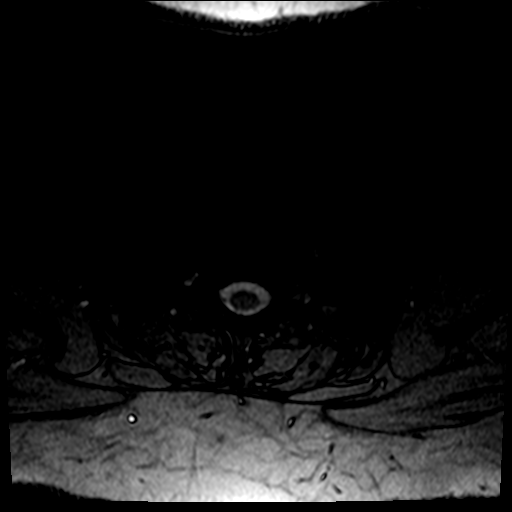
[im 4/26]
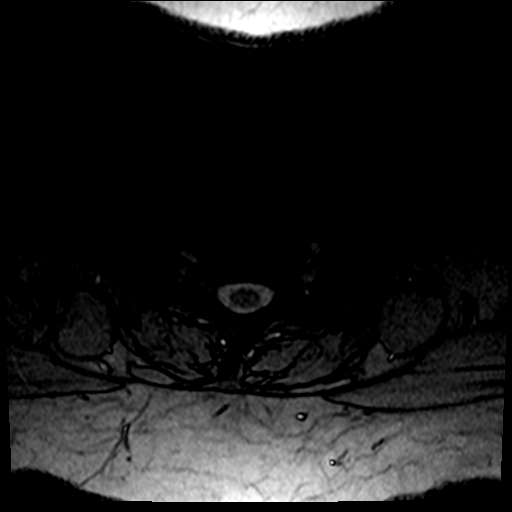
[im 8/26]
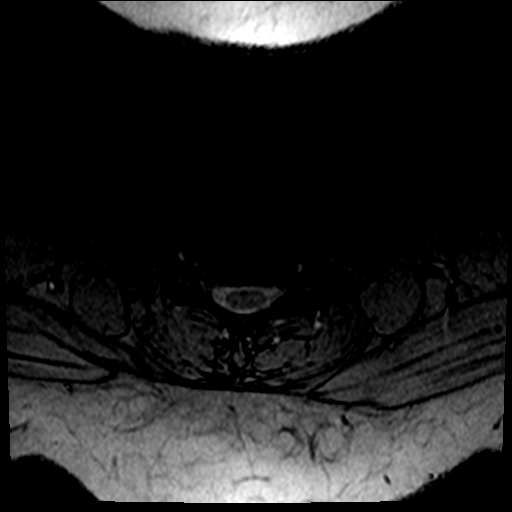

[31 of 48 positions shown; findings below may reference images not displayed]

FINDINGS: Alignment: Physiologic.

Vertebrae: No fracture, evidence of discitis, or bone lesion.

Cord: Normal signal and morphology.

Posterior Fossa, vertebral arteries, paraspinal tissues: Visualized
posterior fossa is normal. Vertebral artery flow voids are
preserved. No prevertebral soft tissue swelling.

Disc levels:

C1-C2: Normal.

C2-C3: Normal disc space and facets. No spinal canal or
neuroforaminal stenosis.

C3-C4: Slightly increased size of small central disc protrusion with
mild spinal canal stenosis. No foraminal stenosis.

C4-C5: Left uncovertebral disc osteophyte complex with impression on
the left ventral aspect of the spinal cord. No central spinal canal
stenosis.

C5-C6: Progression of right subarticular disc extrusion with minimal
superior migration. This flattens the right ventral aspect of spinal
cord. No associated signal change. There is moderate central spinal
canal stenosis and moderate right foraminal narrowing.

C6-C7: Normal disc space and facets. No spinal canal or
neuroforaminal stenosis.

C7-T1: Normal disc space and facets. No spinal canal or
neuroforaminal stenosis.
IMPRESSION: 1. Progression of C5-6 right subarticular disc extrusion with
moderate spinal canal stenosis and mild to moderate right foraminal
stenosis.
2. Slight progression of small C3-4 central disc protrusion with
mild spinal canal stenosis.
3. Unchanged C4-5 left uncovertebral disc osteophyte complex
impressing on the left ventral spinal cord, but not causing central
spinal canal or neuroforaminal stenosis.

## 2018-10-04 ENCOUNTER — Ambulatory Visit (HOSPITAL_COMMUNITY): Payer: BLUE CROSS/BLUE SHIELD | Admitting: Psychiatry

## 2018-10-18 ENCOUNTER — Ambulatory Visit (HOSPITAL_COMMUNITY): Payer: BLUE CROSS/BLUE SHIELD | Admitting: Psychiatry

## 2018-10-20 ENCOUNTER — Ambulatory Visit (INDEPENDENT_AMBULATORY_CARE_PROVIDER_SITE_OTHER): Payer: BC Managed Care – PPO | Admitting: Psychiatry

## 2018-10-20 ENCOUNTER — Other Ambulatory Visit: Payer: Self-pay

## 2018-10-20 ENCOUNTER — Encounter (HOSPITAL_COMMUNITY): Payer: Self-pay | Admitting: Psychiatry

## 2018-10-20 DIAGNOSIS — F411 Generalized anxiety disorder: Secondary | ICD-10-CM

## 2018-10-20 DIAGNOSIS — F321 Major depressive disorder, single episode, moderate: Secondary | ICD-10-CM

## 2018-10-20 MED ORDER — ALPRAZOLAM 1 MG PO TABS: 1.0000 mg | ORAL_TABLET | Freq: Three times a day (TID) | ORAL | 2 refills | Status: DC | PRN

## 2018-10-20 MED ORDER — DULOXETINE HCL 60 MG PO CPEP: 60.0000 mg | ORAL_CAPSULE | Freq: Two times a day (BID) | ORAL | 2 refills | Status: DC

## 2018-10-20 MED ORDER — TEMAZEPAM 30 MG PO CAPS: 30.0000 mg | ORAL_CAPSULE | Freq: Every evening | ORAL | 2 refills | Status: DC | PRN

## 2018-10-20 MED ORDER — ARIPIPRAZOLE 2 MG PO TABS: 2.0000 mg | ORAL_TABLET | ORAL | 2 refills | Status: DC

## 2018-10-20 NOTE — Progress Notes (Signed)
Virtual Visit via Telephone Note  I connected with Virginia Trujillo on 10/20/18 at 10:20 AM EDT by telephone and verified that I am speaking with the correct person using two identifiers.   I discussed the limitations, risks, security and privacy concerns of performing an evaluation and management service by telephone and the availability of in person appointments. I also discussed with the patient that there may be a patient responsible charge related to this service. The patient expressed understanding and agreed to proceed.     I discussed the assessment and treatment plan with the patient. The patient was provided an opportunity to ask questions and all were answered. The patient agreed with the plan and demonstrated an understanding of the instructions.   The patient was advised to call back or seek an in-person evaluation if the symptoms worsen or if the condition fails to improve as anticipated.  I provided 15 minutes of non-face-to-face time during this encounter.   Diannia Rudereborah Ross, MD  [facility] MD/PA/NP OP Progress Note  10/20/2018 10:30 AM Virginia Trujillo  MRN:  [id number]  Chief Complaint:  Chief Complaint    Depression; Anxiety; Follow-up     HPI: This patient is a 53 year old married black female who [place]. 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  The patient was referred by her primary care physician, Dr. Phillips OdorGolding, for further assessment and treatment of anxiety and depression.  The patient states that she's always been a somewhat anxious person. However her depression seriously worsened about 10 years ago. Her grandmother mother and father died all within a few months of each other. She was very close to all of these family members but particular to her mother. She became more depressed and anxious. She's had an increasingly difficult time working. She is to work in some sort of Designer, fashion/clothingclothing factory  but became very anxious being around people. Sometimes she would just walk off the job and not be able to handle it. At this point she spends most of her time in her house. She can't go out without a family member going with her. She recently went on a cruise with her family and had a horrible time because she was so anxious. She is on Cymbalta 60 mg but doesn't take it consistently and I explained it will not work unless it's taken daily. She's never been on any other medications for anxiety but has tried Lexapro without success.  Currently the patient feels depressed and anxious. She is not really enjoying her life. She goes to church but is too nervous to participate much. She cries on an almost daily basis. She has chronic back pain. She complains of difficulty with short-term memory and cannot remember things without her husband reminding her. She is extremely self-conscious and when she goes out she feels like people are watching her or judging her. Her sleep is poor and she only sleeps about 4 hours a night despite having sleep apnea and using a CPAP machine. She does not use drugs or alcohol and does not have auditory or visual hallucinations paranoia suicidal or homicidal ideation. She has panic attacks on a daily basis  The patient returns after 2 months and is assessed by telephone due to the coronavirus pandemic.  She states that overall she is doing fairly well.  She has had some concerns about her niece who is actually diagnosed with coronavirus but is now recovered.  Another niece got shot by her boyfriend in the leg but is doing better.  She and her husband are purchasing a new modular home and she is really excited about this.  She still feels sad about her dog at times but it is not as overwhelming as it had been.  She is in pretty good spirits most of the time.  She is sleeping well with her medication and her energy seems to have improved.  She denies any thoughts of self-harm or suicidal  ideation. Visit Diagnosis:    ICD-10-CM   1. Generalized anxiety disorder  F41.1   2. 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 episode (HCC)  F32.1     Past Psychiatric History: none  Past Medical History:  Past Medical History:  Diagnosis Date  . Acid reflux   . Anxiety   . Depression   . Fibromyalgia     Past Surgical History:  Procedure Laterality Date  . CESAREAN SECTION    . MOUTH SURGERY    . tubes tied      Family Psychiatric History: see below  Family History:  Family History  Problem Relation Age of Onset  . Hypertension Father   . Hypertension Brother   . Diabetes Brother   . Depression Brother   . Anxiety disorder Brother   . Stroke Maternal Grandmother   . Stroke Paternal Grandfather   . Congestive Heart Failure Sister   . Diabetes Sister   . Hypertension Sister   . Anxiety disorder Sister   . Depression Sister   . Heart disease Unknown   . Arthritis Unknown   . Cancer Unknown   . Asthma Unknown   . Diabetes Unknown   . Kidney disease Unknown     Social History:  Social History   Socioeconomic History  . Marital status: Married    Spouse name: Not on file  . Number of children: 2  . Years of education: 6212  . Highest education level: Not on file  Occupational History  . Occupation: none    Employer: UNEMPLOYED  . Occupation: Unemployed   Social Needs  . Financial resource strain: Not on file  . Food insecurity    Worry: Not on file    Inability: Not on file  . Transportation needs    Medical: Not on file    Non-medical: Not on file  Tobacco Use  . Smoking status: Never Smoker  . Smokeless tobacco: Never Used  Substance and Sexual Activity  . Alcohol use: No  . Drug use: No  . Sexual activity: Yes    Birth control/protection: Surgical  Lifestyle  . Physical activity    Days per week: Not on file    Minutes per session: Not on file  . Stress: Not on file  Relationships  . Social Wellsite geologistconnections     Talks on phone: Not on file    Gets together: Not on file    Attends religious service: Not on file    Active member of club or organization: Not on file    Attends meetings of clubs or organizations: Not on file    Relationship status: Not on file  Other Topics Concern  . Not on file  Social History Narrative   Patient lives at home with husband and son.    Patient does not work   Patient has a high school education    Patient has 2 children.    Allergies:  Allergies  Allergen Reactions  .  Prednisone Other (See Comments)    Depression and crying  . Ambien [Zolpidem Tartrate] Other (See Comments)    hallucinations  . Penicillins     Metabolic Disorder Labs: No results found for: HGBA1C, MPG No results found for: PROLACTIN No results found for: CHOL, TRIG, HDL, CHOLHDL, VLDL, LDLCALC Lab Results  Component Value Date   TSH 3.190 11/29/2013    Therapeutic Level Labs: No results found for: LITHIUM No results found for: VALPROATE No components found for:  CBMZ  Current Medications: Current Outpatient Medications  Medication Sig Dispense Refill  . Acetaminophen (TYLENOL 8 HOUR PO) Take by mouth.      Marland Kitchen albuterol (PROVENTIL) (2.5 MG/3ML) 0.083% nebulizer solution Take 2.5 mg by nebulization every 6 (six) hours as needed for wheezing or shortness of breath.    . ALPRAZolam (XANAX) 1 MG tablet Take 1 tablet (1 mg total) by mouth 3 (three) times daily as needed for anxiety. 90 tablet 2  . ARIPiprazole (ABILIFY) 2 MG tablet Take 1 tablet (2 mg total) by mouth every morning. 30 tablet 2  . baclofen (LIORESAL) 10 MG tablet Take 10 mg by mouth 3 (three) times daily.    . Cholecalciferol (VITAMIN D PO) Take by mouth.    . doxycycline (VIBRAMYCIN) 100 MG capsule Take 100 mg by mouth 2 (two) times daily.    . DULoxetine (CYMBALTA) 60 MG capsule Take 1 capsule (60 mg total) by mouth 2 (two) times daily. 60 capsule 2  . gabapentin (NEURONTIN) 600 MG tablet daily  1  . meloxicam  (MOBIC) 15 MG tablet Take 15 mg by mouth daily.      . montelukast (SINGULAIR) 10 MG tablet Take 10 mg by mouth at bedtime.    Marland Kitchen omeprazole (PRILOSEC) 40 MG capsule Take 1 capsule (40 mg total) by mouth daily. Appointment needed for further refills (Patient not taking: Reported on 09/01/2018) 30 capsule 3  . SUMAtriptan (IMITREX) 25 MG tablet Take 25 mg by mouth every 2 (two) hours as needed for migraine. May repeat in 2 hours if headache persists or recurs.    . temazepam (RESTORIL) 30 MG capsule Take 1 capsule (30 mg total) by mouth at bedtime as needed for sleep. 30 capsule 2  . traMADol (ULTRAM-ER) 300 MG 24 hr tablet Take 300 mg by mouth daily.     No current facility-administered medications for this visit.      Musculoskeletal: Strength & Muscle Tone: within normal limits Gait & Station: normal Patient leans: N/A  Psychiatric Specialty Exam: Review of Systems  Musculoskeletal: Positive for back pain.  All other systems reviewed and are negative.   There were no vitals taken for this visit.There is no height or weight on file to calculate BMI.  General Appearance: NA  Eye Contact:  NA  Speech:  Clear and Coherent  Volume:  Normal  Mood:  Anxious  Affect:  NA  Thought Process:  Goal Directed  Orientation:  Full (Time, Place, and Person)  Thought Content: Rumination   Suicidal Thoughts:  No  Homicidal Thoughts:  No  Memory:  Immediate;   Good Recent;   Good Remote;   Fair  Judgement:  Good  Insight:  Fair  Psychomotor Activity:  Normal  Concentration:  Concentration: Fair and Attention Span: Fair  Recall:  [employer] of Knowledge: Fair  Language: Good  Akathisia:  No  Handed:  Right  AIMS (if indicated): not done  Assets:  Communication Skills Desire for Improvement Resilience Social  Support Talents/Skills  ADL's:  Intact  Cognition: WNL  Sleep:  Good   Screenings: GAD-7     Counselor from 11/10/2017 in [facility]   Total GAD-7 Score  19       Assessment and Plan:  This patient is a 53 year old female with a history of depression and anxiety.  She is doing fairly well now that she is gotten over the death of her dog.  She will continue Xanax 1 mg 3 times daily for anxiety, Abilify 2 mg daily for augmentation, Cymbalta 60 mg twice daily for depression and temazepam 30 mg at bedtime for sleep.  She will return to see me in 3 months  Diannia Rudereborah Ross, MD 10/20/2018, 10:30 AM

## 2018-11-01 ENCOUNTER — Telehealth (HOSPITAL_COMMUNITY): Payer: Self-pay | Admitting: Psychiatry

## 2018-11-01 ENCOUNTER — Ambulatory Visit (HOSPITAL_COMMUNITY): Payer: BC Managed Care – PPO | Admitting: Psychiatry

## 2018-11-01 ENCOUNTER — Other Ambulatory Visit: Payer: Self-pay

## 2018-11-01 NOTE — Telephone Encounter (Signed)
Therapist called patient twice for scheduled appointment, response from answering machine indicated mail box was full. Therapist also sent patient two text messages for scheduled appointment, no response.

## 2018-11-17 ENCOUNTER — Other Ambulatory Visit (HOSPITAL_COMMUNITY): Payer: Self-pay | Admitting: Psychiatry

## 2018-11-17 ENCOUNTER — Ambulatory Visit (INDEPENDENT_AMBULATORY_CARE_PROVIDER_SITE_OTHER): Payer: BC Managed Care – PPO | Admitting: Psychiatry

## 2018-11-17 ENCOUNTER — Other Ambulatory Visit: Payer: Self-pay

## 2018-11-17 DIAGNOSIS — F411 Generalized anxiety disorder: Secondary | ICD-10-CM

## 2018-11-17 NOTE — Progress Notes (Signed)
Virtual Visit via Telephone Note  I connected with Virginia Trujillo on 11/17/18 at 10:00 AM EDT by telephone and verified that I am speaking with the correct person using two identifiers.   I discussed the limitations, risks, security and privacy concerns of performing an evaluation and management service by telephone and the availability of in person appointments. I also discussed with the patient that there may be a patient responsible charge related to this service. The patient expressed understanding and agreed to proceed.  I provided 35 minutes of non-face-to-face time during this encounter.   [name], LCSW                          THERAPIST PROGRESS NOTE  Session Time: Wednesday 11/17/2018 10:00 AM -  10:35 AM  Participation Level: Active  Behavioral Response: euthymic  Type of Therapy: Individual Therapy  Treatment Goals addressed: Learn and implement calming skills to manage and reduce overall anxiety so that it does not interfere with daily functioning,  Identify, challenge, and replace biased, fearful self talk with positive, realistic, and empowering self talk  Interventions: CBT and Supportive  Summary: OLAR SANTINI is a 53 y.o. female who presents with symptoms of anxiety that have been lilfelong per patient's report but symptoms worsened when her mother died 15+ years ago. Also during the same 2 month period around her mother's death,her grandparents and her sister died. Symptoms include isolative behaviors, panic attacks, nervousness, sleep difficulty, memory difficulty, persistent worry, daily crying spells, depressed mood.  Patient last's contact was by virtual visit about 2 months ago. She reports multiple stressors since last session.  Her niece and niece's fianc both had coronavirus.  She also reports her father was in a car accident but now is doing well.  Her brother also has some health issues but is recovering well.  She reports additional stress related  to [facility] unit not working for 3 days last week.  She reports having a couple of rough moments but use deep breathing, distracting activities, and support from her family to cope.  She continue to miss her dog but no longer is experiencing overwhelming grief.  She is engaged in life, socializes with family, attends church, completes household projects, and babysits her grandson.  She is very excited she and her husband are in the process of getting a new house.  She also reports continuing to enjoy her new car.  Suicidal/Homicidal: None  Therapist Response: reviewed symptoms, praised and reinforced patient's use of helpful coping techniques,   discussed stressors, facilitated expression of thoughts and feelings, validated feelings, began to review  Mindfulness but patient did not have her copy of handout with her, assigned patient  to review handout for next session, also will mail patient another copy along with handout on mindful emotions journal, discussed and processed patient's feelings about termination, developed stepdown plan to include 2 more monthly sessions.  Plan: Return again in 2- 3 weeks.  Diagnosis: Axis I: Generalized Anxiety Disorder    Axis II: No diagnosis    [name], LCSW 11/17/2018

## 2018-11-19 ENCOUNTER — Other Ambulatory Visit (HOSPITAL_COMMUNITY): Payer: Self-pay | Admitting: Psychiatry

## 2018-12-15 ENCOUNTER — Ambulatory Visit (INDEPENDENT_AMBULATORY_CARE_PROVIDER_SITE_OTHER): Payer: BC Managed Care – PPO | Admitting: Psychiatry

## 2018-12-15 ENCOUNTER — Other Ambulatory Visit: Payer: Self-pay

## 2018-12-15 DIAGNOSIS — F411 Generalized anxiety disorder: Secondary | ICD-10-CM

## 2018-12-15 NOTE — Progress Notes (Signed)
Virtual Visit via Telephone Note  I connected with Virginia Trujillo on 12/15/18 at 10:20 AM EDT by telephone and verified that I am speaking with the correct person using two identifiers.   I discussed the limitations, risks, security and privacy concerns of performing an evaluation and management service by telephone and the availability of in person appointments. I also discussed with the patient that there may be a patient responsible charge related to this service. The patient expressed understanding and agreed to proceed.    I provided 38  minutes of non-face-to-face time during this encounter.   [name], LCSW                           THERAPIST PROGRESS NOTE  Session Time: Wednesday 12/15/2018 10:20 AM  - 10:58 AM     Participation Level: Active  Behavioral Response: talkative  Type of Therapy: Individual Therapy  Treatment Goals addressed: Learn and implement calming skills to manage and reduce overall anxiety so that it does not interfere with daily functioning,  Identify, challenge, and replace biased, fearful self talk with positive, realistic, and empowering self talk  Interventions: CBT and Supportive  Summary: Virginia Trujillo is a 53 y.o. female who presents with symptoms of anxiety that have been lilfelong per patient's report but symptoms worsened when her mother died 15+ years ago. Also during the same 2 month period around her mother's death,her grandparents and her sister died. Symptoms include isolative behaviors, panic attacks, nervousness, sleep difficulty, memory difficulty, persistent worry, daily crying spells, depressed mood.  Patient last's contact was by virtual visit about a month ago. She reports having an episode of panic, being really depressed, and having crying spells this past Monday. She eventually identifies triggers as bing  increased worry related to   brother starting dialysis, niece having a baby, and seeing a picture of deceased dog's  sibling. She reports increased negative thoughts and intentionally not taking her medication that day. She was eventually able to calm down with support from son and husband as well as taking a xanax. She reports feeling better today.   Suicidal/Homicidal: None  Therapist Response: reviewed symptoms, assisted patient identify triggers of recent episode, discussed stressors, facilitated expression of thoughts and feelings, validated feelings, discussed integrative grief, reviewed mindfulness and the window of tolerance, discussed rationale for and practiced grounding techniques when outside the window of tolerance, assigned patient to practice a grounding technique daily, assigned patient to review handout on mindfulness and the window of tolerance, discussed importance of medication compliance  Plan: Return again in 2- 3 weeks.  Diagnosis: Axis I: Generalized Anxiety Disorder    Axis II: No diagnosis    [name], LCSW 12/15/2018

## 2019-01-12 ENCOUNTER — Ambulatory Visit (HOSPITAL_COMMUNITY): Payer: BC Managed Care – PPO | Admitting: Psychiatry

## 2019-01-20 ENCOUNTER — Encounter (HOSPITAL_COMMUNITY): Payer: Self-pay | Admitting: Psychiatry

## 2019-01-20 ENCOUNTER — Other Ambulatory Visit: Payer: Self-pay

## 2019-01-20 ENCOUNTER — Ambulatory Visit (INDEPENDENT_AMBULATORY_CARE_PROVIDER_SITE_OTHER): Payer: BC Managed Care – PPO | Admitting: Psychiatry

## 2019-01-20 DIAGNOSIS — F411 Generalized anxiety disorder: Secondary | ICD-10-CM | POA: Diagnosis not present

## 2019-01-20 DIAGNOSIS — F321 Major depressive disorder, single episode, moderate: Secondary | ICD-10-CM | POA: Diagnosis not present

## 2019-01-20 MED ORDER — ALPRAZOLAM 1 MG PO TABS: 1.0000 mg | ORAL_TABLET | Freq: Three times a day (TID) | ORAL | 2 refills | Status: DC | PRN

## 2019-01-20 MED ORDER — TEMAZEPAM 30 MG PO CAPS: 30.0000 mg | ORAL_CAPSULE | Freq: Every evening | ORAL | 2 refills | Status: DC | PRN

## 2019-01-20 MED ORDER — ARIPIPRAZOLE 2 MG PO TABS: 2.0000 mg | ORAL_TABLET | ORAL | 2 refills | Status: DC

## 2019-01-20 MED ORDER — DULOXETINE HCL 60 MG PO CPEP: 60.0000 mg | ORAL_CAPSULE | Freq: Two times a day (BID) | ORAL | 2 refills | Status: DC

## 2019-01-20 NOTE — Progress Notes (Signed)
Virtual Visit via Telephone Note  I connected with Virginia Trujillo on 01/20/19 at 10:00 AM EDT by telephone and verified that I am speaking with the correct person using two identifiers.   I discussed the limitations, risks, security and privacy concerns of performing an evaluation and management service by telephone and the availability of in person appointments. I also discussed with the patient that there may be a patient responsible charge related to this service. The patient expressed understanding and agreed to proceed.     I discussed the assessment and treatment plan with the patient. The patient was provided an opportunity to ask questions and all were answered. The patient agreed with the plan and demonstrated an understanding of the instructions.   The patient was advised to call back or seek an in-person evaluation if the symptoms worsen or if the condition fails to improve as anticipated.  I provided 15 minutes of non-face-to-face time during this encounter.   Diannia Ruder, MD  [facility] MD/PA/NP OP Progress Note  01/20/2019 10:12 AM Virginia Trujillo  MRN:  [id number]  Chief Complaint:  Chief Complaint    Anxiety; Follow-up; Depression     HPI: This patient is a 53 year old married black female who lives with her husband and son in [place].  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  The patient returns for follow-up after 3 months for treatment of depression and anxiety.  She states that she is doing fairly well.  She has had some trouble sleeping due to knee pain but she is going to be seeing her primary doctor about this.  She is hoping to get some injections.  Her mood is generally been fairly good.  She does not go out much because of the coronavirus pandemic but keeps in touch with friends and family via face time.  She states that her mood has been good most of the time.  She still grieving the loss of her dog who died several months ago and at times when  she sees dogs on TV she gets very sad and tearful.  She states that the Xanax helps a good deal when she gets like this.  She denies any thoughts of self-harm and is in general functioning well with few panic attacks. Visit Diagnosis:    ICD-10-CM   1. Generalized anxiety disorder  F41.1   2. 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 episode (HCC)  F32.1     Past Psychiatric History: none  Past Medical History:  Past Medical History:  Diagnosis Date  . Acid reflux   . Anxiety   . Depression   . Fibromyalgia     Past Surgical History:  Procedure Laterality Date  . CESAREAN SECTION    . MOUTH SURGERY    . tubes tied      Family Psychiatric History: see below  Family History:  Family History  Problem Relation Age of Onset  . Hypertension Father   . Hypertension Brother   . Diabetes Brother   . Depression Brother   . Anxiety disorder Brother   . Stroke Maternal Grandmother   . Stroke Paternal Grandfather   . Congestive Heart Failure Sister   . Diabetes Sister   . Hypertension Sister   . Anxiety disorder Sister   . Depression Sister   . Heart disease Unknown   . Arthritis Unknown   . Cancer Unknown   . Asthma Unknown   . Diabetes Unknown   .  Kidney disease Unknown     Social History:  Social History   Socioeconomic History  . Marital status: Married    Spouse name: Not on file  . Number of children: 2  . Years of education: 33  . Highest education level: Not on file  Occupational History  . Occupation: none    Employer: UNEMPLOYED  . Occupation: Unemployed   Social Needs  . Financial resource strain: Not on file  . Food insecurity    Worry: Not on file    Inability: Not on file  . Transportation needs    Medical: Not on file    Non-medical: Not on file  Tobacco Use  . Smoking status: Never Smoker  . Smokeless tobacco: Never Used  Substance and Sexual Activity  . Alcohol use: No  . Drug use: No  . Sexual activity: Yes     Birth control/protection: Surgical  Lifestyle  . Physical activity    Days per week: Not on file    Minutes per session: Not on file  . Stress: Not on file  Relationships  . Social Herbalist on phone: Not on file    Gets together: Not on file    Attends religious service: Not on file    Active member of club or organization: Not on file    Attends meetings of clubs or organizations: Not on file    Relationship status: Not on file  Other Topics Concern  . Not on file  Social History Narrative   Patient lives at home with husband and son.    Patient does not work   Patient has a high school education    Patient has 2 children.    Allergies:  Allergies  Allergen Reactions  . Prednisone Other (See Comments)    Depression and crying  . Ambien [Zolpidem Tartrate] Other (See Comments)    hallucinations  . Penicillins     Metabolic Disorder Labs: No results found for: HGBA1C, MPG No results found for: PROLACTIN No results found for: CHOL, TRIG, HDL, CHOLHDL, VLDL, LDLCALC Lab Results  Component Value Date   TSH 3.190 11/29/2013    Therapeutic Level Labs: No results found for: LITHIUM No results found for: VALPROATE No components found for:  CBMZ  Current Medications: Current Outpatient Medications  Medication Sig Dispense Refill  . Acetaminophen (TYLENOL 8 HOUR PO) Take by mouth.      Marland Kitchen albuterol (PROVENTIL) (2.5 MG/3ML) 0.083% nebulizer solution Take 2.5 mg by nebulization every 6 (six) hours as needed for wheezing or shortness of breath.    . ALPRAZolam (XANAX) 1 MG tablet Take 1 tablet (1 mg total) by mouth 3 (three) times daily as needed for anxiety. 90 tablet 2  . ARIPiprazole (ABILIFY) 2 MG tablet Take 1 tablet (2 mg total) by mouth every morning. 30 tablet 2  . baclofen (LIORESAL) 10 MG tablet Take 10 mg by mouth 3 (three) times daily.    . Cholecalciferol (VITAMIN D PO) Take by mouth.    . doxycycline (VIBRAMYCIN) 100 MG capsule Take 100 mg by mouth  2 (two) times daily.    . DULoxetine (CYMBALTA) 60 MG capsule Take 1 capsule (60 mg total) by mouth 2 (two) times daily. 60 capsule 2  . gabapentin (NEURONTIN) 600 MG tablet daily  1  . meloxicam (MOBIC) 15 MG tablet Take 15 mg by mouth daily.      . montelukast (SINGULAIR) 10 MG tablet Take 10 mg by mouth at  bedtime.    Marland Kitchen. omeprazole (PRILOSEC) 40 MG capsule Take 1 capsule (40 mg total) by mouth daily. Appointment needed for further refills (Patient not taking: Reported on 09/01/2018) 30 capsule 3  . SUMAtriptan (IMITREX) 25 MG tablet Take 25 mg by mouth every 2 (two) hours as needed for migraine. May repeat in 2 hours if headache persists or recurs.    . temazepam (RESTORIL) 30 MG capsule Take 1 capsule (30 mg total) by mouth at bedtime as needed for sleep. 30 capsule 2  . traMADol (ULTRAM-ER) 300 MG 24 hr tablet Take 300 mg by mouth daily.     No current facility-administered medications for this visit.      Musculoskeletal: Strength & Muscle Tone: within normal limits Gait & Station: normal Patient leans: N/A  Psychiatric Specialty Exam: Review of Systems  Musculoskeletal: Positive for back pain, joint pain and neck pain.  Psychiatric/Behavioral: The patient is nervous/anxious.   All other systems reviewed and are negative.   There were no vitals taken for this visit.There is no height or weight on file to calculate BMI.  General Appearance: NA  Eye Contact:  NA  Speech:  Clear and Coherent  Volume:  Normal  Mood:  Anxious and Euthymic  Affect:  NA  Thought Process:  Goal Directed  Orientation:  Full (Time, Place, and Person)  Thought Content: Rumination   Suicidal Thoughts:  No  Homicidal Thoughts:  No  Memory:  Immediate;   Good Recent;   Good Remote;   Fair  Judgement:  Good  Insight:  Fair  Psychomotor Activity:  Decreased  Concentration:  Concentration: Good and Attention Span: Good  Recall:  Good  Fund of Knowledge: Fair  Language: Good  Akathisia:  No  Handed:   Right  AIMS (if indicated): not done  Assets:  Communication Skills Desire for Improvement Resilience Social Support Talents/Skills  ADL's:  Intact  Cognition: WNL  Sleep:  Fair   Screenings: GAD-7     Counselor from 11/10/2017 in [facility]  Total GAD-7 Score  19       Assessment and Plan: This patient is a 53 year old female with a history of depression and anxiety.  She seems to be doing fairly well most of the time.  She will continue Xanax 1 mg 3 times daily for anxiety, Cymbalta 60 mg twice daily for depression, Abilify 2 mg daily for augmentation and temazepam 30 mg at bedtime for sleep.  She will return to see me in 3 months   Diannia Rudereborah Shyvonne Chastang, MD 01/20/2019, 10:12 AM

## 2019-01-28 ENCOUNTER — Ambulatory Visit (HOSPITAL_COMMUNITY): Payer: BC Managed Care – PPO | Admitting: Psychiatry

## 2019-01-31 ENCOUNTER — Encounter (HOSPITAL_COMMUNITY): Payer: Self-pay | Admitting: Psychiatry

## 2019-01-31 ENCOUNTER — Other Ambulatory Visit: Payer: Self-pay

## 2019-01-31 ENCOUNTER — Ambulatory Visit (INDEPENDENT_AMBULATORY_CARE_PROVIDER_SITE_OTHER): Payer: BC Managed Care – PPO | Admitting: Psychiatry

## 2019-01-31 DIAGNOSIS — F411 Generalized anxiety disorder: Secondary | ICD-10-CM

## 2019-01-31 NOTE — Progress Notes (Signed)
Virtual Visit via Telephone Note  I connected with Virginia Trujillo on 01/31/19 at  9:00 AM EDT by telephone and verified that I am speaking with the correct person using two identifiers.   I discussed the limitations, risks, security and privacy concerns of performing an evaluation and management service by telephone and the availability of in person appointments. I also discussed with the patient that there may be a patient responsible charge related to this service. The patient expressed understanding and agreed to proceed.  I provided 30 minutes of non-face-to-face time during this encounter.   [name], LCSW                       THERAPIST PROGRESS NOTE  Session Time: Monday 01/31/2019 9:00 AM - 9:30 AM    Participation Level: Active  Behavioral Response: talkative  Type of Therapy: Individual Therapy  Treatment Goals addressed: Learn and implement calming skills to manage and reduce overall anxiety so that it does not interfere with daily functioning,  Identify, challenge, and replace biased, fearful self talk with positive, realistic, and empowering self talk  Interventions: CBT and Supportive  Summary: Virginia Trujillo is a 53 y.o. female who presents with symptoms of anxiety that have been lilfelong per patient's report but symptoms worsened when her mother died 15+ years ago. Also during the same 2 month period around her mother's death,her grandparents and her sister died. Symptoms include isolative behaviors, panic attacks, nervousness, sleep difficulty, memory difficulty, persistent worry, daily crying spells, depressed mood.  Patient last's contact was by virtual visit about a month ago. She reports doing well overall.  She has maintained involvement and activities and socialized with family and friends.  She reports experiencing increased anxiety and sleep difficulty last week triggered by arthritic pain in her knees and sinus issues.  She reports being a little more  tearful and also excessive worry about her symptoms related to sinus issues.  She states having a COVID test as she lost her sense of taste.  She reports having to wait 3 days for the results and initially experiencing spiraling negative thoughts.  However, she talked with her husband and began to have alternative thoughts.  She reports being resuming normal interest and involvement in distracting activities.  She states she was okay and did not continue to to worry about the results.  She is very pleased with the results came back negative.  She is pleased with her progress in treatment but expresses some anxiety about the approaching holidays as she fears it will trigger grief and loss issues especially since this is the first holiday season since the death of her dog.    Suicidal/Homicidal: None  Therapist Response: reviewed symptoms, praised and reinforced patient's use of healthy grounding technique, reviewed mindfulness and the window of tolerance, discussed lapse versus relapse, discussed patient's concerns about coping with upcoming holidays, reviewed integrative grief and normalized feelings of sadness and loss around holidays and anniversaries, agreed to continue to discuss ways to cope with holidays next session          Plan: Return again in 3-4 weeks.  Diagnosis: Axis I: Generalized Anxiety Disorder    Axis II: No diagnosis    [name], LCSW 01/31/2019

## 2019-02-18 ENCOUNTER — Other Ambulatory Visit (HOSPITAL_COMMUNITY): Payer: Self-pay | Admitting: Psychiatry

## 2019-02-21 ENCOUNTER — Other Ambulatory Visit: Payer: Self-pay

## 2019-02-21 ENCOUNTER — Ambulatory Visit (INDEPENDENT_AMBULATORY_CARE_PROVIDER_SITE_OTHER): Payer: BC Managed Care – PPO | Admitting: Psychiatry

## 2019-02-21 DIAGNOSIS — F321 Major depressive disorder, single episode, moderate: Secondary | ICD-10-CM

## 2019-02-21 DIAGNOSIS — M779 Enthesopathy, unspecified: Secondary | ICD-10-CM | POA: Diagnosis not present

## 2019-02-21 DIAGNOSIS — F411 Generalized anxiety disorder: Secondary | ICD-10-CM

## 2019-02-21 DIAGNOSIS — M797 Fibromyalgia: Secondary | ICD-10-CM | POA: Diagnosis not present

## 2019-02-21 DIAGNOSIS — Z1389 Encounter for screening for other disorder: Secondary | ICD-10-CM | POA: Diagnosis not present

## 2019-02-21 DIAGNOSIS — Z23 Encounter for immunization: Secondary | ICD-10-CM | POA: Diagnosis not present

## 2019-02-21 DIAGNOSIS — Z6841 Body Mass Index (BMI) 40.0 and over, adult: Secondary | ICD-10-CM | POA: Diagnosis not present

## 2019-02-21 DIAGNOSIS — M1991 Primary osteoarthritis, unspecified site: Secondary | ICD-10-CM | POA: Diagnosis not present

## 2019-02-21 NOTE — Progress Notes (Signed)
Virtual Visit via Telephone Note  I connected with Leonard Hendler Nicotra on 02/21/19 at  9:00 AM EDT by telephone and verified that I am speaking with the correct person using two identifiers.   I discussed the limitations, risks, security and privacy concerns of performing an evaluation and management service by telephone and the availability of in person appointments. I also discussed with the patient that there may be a patient responsible charge related to this service. The patient expressed understanding and agreed to proceed.    I provided 40 minutes of non-face-to-face time during this encounter.   [name], LCSW                           THERAPIST PROGRESS NOTE  Session Time: Monday 02/21/2019 9:00 AM  - 9:40 AM   Participation Level: Active  Behavioral Response: talkative  Type of Therapy: Individual Therapy  Treatment Goals addressed: Learn and implement calming skills to manage and reduce overall anxiety so that it does not interfere with daily functioning,  Identify, challenge, and replace biased, fearful self talk with positive, realistic, and empowering self talk  Interventions: CBT and Supportive  Summary: ALLISEN PIDGEON is a 53 y.o. female who presents with symptoms of anxiety that have been lilfelong per patient's report but symptoms worsened when her mother died 15+ years ago. Also during the same 2 month period around her mother's death,her grandparents and her sister died. Symptoms include isolative behaviors, panic attacks, nervousness, sleep difficulty, memory difficulty, persistent worry, daily crying spells, depressed mood.  Patient last's contact was by virtual visit about 3 weeks ago. She reports depressed mood, anxiety, irritability, decreased appetite, difficulty falling and staying asleep, decreased interest in activities, poor motivation, and social withdrawal since last session.  She also reports experiencing increased pain and is scheduled to see her  doctor today.  She expresses frustration she is unable to perform task as she normally would due to the pain and also says the pain has affected her sleep.  She reports reviewing handouts provided in previous sessions to try to cope.  She reports continued support from her family.   Suicidal/Homicidal: None  Therapist Response: reviewed symptoms, praised and reinforced patient's efforts to use handouts from previous sessions, assisted patient identify triggers of increased symptoms of depression and anxiety, discussed the connection between pain/sleep difficulty/and increased symptoms of anxiety and depression, reviewed lapse versus relapse, discussed the role of behavioral activation in managing depression, assisted patient identify value congruent activities to increase behavioral activation, developed plan with patient to follow through with seeing her doctor, schedule an appointment at her hair salon, and attend an upcoming event with her family,         Plan: Return again in 3-4 weeks.  Diagnosis: Axis I: Generalized Anxiety Disorder    Axis II: No diagnosis    [name], LCSW 02/21/2019

## 2019-03-14 ENCOUNTER — Ambulatory Visit (HOSPITAL_COMMUNITY): Payer: BC Managed Care – PPO | Admitting: Psychiatry

## 2019-03-14 ENCOUNTER — Telehealth (HOSPITAL_COMMUNITY): Payer: Self-pay | Admitting: Psychiatry

## 2019-03-14 ENCOUNTER — Other Ambulatory Visit: Payer: Self-pay

## 2019-03-14 NOTE — Telephone Encounter (Signed)
Therapist called patient for scheduled appointment and received voicemail message.  Therapist left message indicating attempt to contact for scheduled appointment and requesting patient call office.

## 2019-03-22 DIAGNOSIS — Z6841 Body Mass Index (BMI) 40.0 and over, adult: Secondary | ICD-10-CM | POA: Diagnosis not present

## 2019-03-22 DIAGNOSIS — E559 Vitamin D deficiency, unspecified: Secondary | ICD-10-CM | POA: Diagnosis not present

## 2019-03-22 DIAGNOSIS — R7309 Other abnormal glucose: Secondary | ICD-10-CM | POA: Diagnosis not present

## 2019-03-22 DIAGNOSIS — M791 Myalgia, unspecified site: Secondary | ICD-10-CM | POA: Diagnosis not present

## 2019-03-22 DIAGNOSIS — I10 Essential (primary) hypertension: Secondary | ICD-10-CM | POA: Diagnosis not present

## 2019-03-22 DIAGNOSIS — D508 Other iron deficiency anemias: Secondary | ICD-10-CM | POA: Diagnosis not present

## 2019-03-22 DIAGNOSIS — M797 Fibromyalgia: Secondary | ICD-10-CM | POA: Diagnosis not present

## 2019-03-22 DIAGNOSIS — E7849 Other hyperlipidemia: Secondary | ICD-10-CM | POA: Diagnosis not present

## 2019-04-05 ENCOUNTER — Other Ambulatory Visit: Payer: Self-pay

## 2019-04-05 ENCOUNTER — Ambulatory Visit (HOSPITAL_COMMUNITY): Payer: BC Managed Care – PPO | Admitting: Psychiatry

## 2019-04-13 ENCOUNTER — Ambulatory Visit (HOSPITAL_COMMUNITY): Payer: BC Managed Care – PPO | Admitting: Psychiatry

## 2019-04-14 ENCOUNTER — Ambulatory Visit (INDEPENDENT_AMBULATORY_CARE_PROVIDER_SITE_OTHER): Payer: BC Managed Care – PPO | Admitting: Psychiatry

## 2019-04-14 ENCOUNTER — Ambulatory Visit (HOSPITAL_COMMUNITY): Payer: BC Managed Care – PPO | Admitting: Psychiatry

## 2019-04-14 ENCOUNTER — Other Ambulatory Visit: Payer: Self-pay

## 2019-04-14 DIAGNOSIS — F411 Generalized anxiety disorder: Secondary | ICD-10-CM
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
## 2019-04-21 ENCOUNTER — Encounter (HOSPITAL_COMMUNITY): Payer: Self-pay | Admitting: Psychiatry

## 2019-04-21 ENCOUNTER — Ambulatory Visit (INDEPENDENT_AMBULATORY_CARE_PROVIDER_SITE_OTHER): Payer: BC Managed Care – PPO | Admitting: Psychiatry

## 2019-04-21 ENCOUNTER — Other Ambulatory Visit: Payer: Self-pay

## 2019-04-21 DIAGNOSIS — F411 Generalized anxiety disorder: Secondary | ICD-10-CM | POA: Diagnosis not present

## 2019-04-21 DIAGNOSIS — F321 Major depressive disorder, single episode, moderate: Secondary | ICD-10-CM | POA: Diagnosis not present

## 2019-04-21 MED ORDER — ALPRAZOLAM 1 MG PO TABS: 1.0000 mg | ORAL_TABLET | Freq: Three times a day (TID) | ORAL | 2 refills | Status: DC | PRN

## 2019-04-21 MED ORDER — DULOXETINE HCL 60 MG PO CPEP: 60.0000 mg | ORAL_CAPSULE | Freq: Two times a day (BID) | ORAL | 2 refills | Status: DC

## 2019-04-21 MED ORDER — TEMAZEPAM 30 MG PO CAPS: 30.0000 mg | ORAL_CAPSULE | Freq: Every evening | ORAL | 2 refills | Status: DC | PRN

## 2019-04-21 MED ORDER — ARIPIPRAZOLE 2 MG PO TABS: 2.0000 mg | ORAL_TABLET | ORAL | 2 refills | Status: DC

## 2019-04-21 NOTE — Progress Notes (Signed)
Virtual Visit via Telephone Note  I connected with Virginia Trujillo on 04/21/19 at 10:00 AM EST by telephone and verified that I am speaking with the correct person using two identifiers.   I discussed the limitations, risks, security and privacy concerns of performing an evaluation and management service by telephone and the availability of in person appointments. I also discussed with the patient that there may be a patient responsible charge related to this service. The patient expressed understanding and agreed to proceed.    I discussed the assessment and treatment plan with the patient. The patient was provided an opportunity to ask questions and all were answered. The patient agreed with the plan and demonstrated an understanding of the instructions.   The patient was advised to call back or seek an in-person evaluation if the symptoms worsen or if the condition fails to improve as anticipated.  I provided 15 minutes of non-face-to-face time during this encounter.   Virginia Rudereborah Fontella Shan, MD  [facility] MD/PA/NP OP Progress Note  04/21/2019 10:06 AM Virginia Trujillo  MRN:  [id number]  Chief Complaint:  Chief Complaint    Depression; Anxiety; Follow-up     HPI: This patient is a 53 year old married black female who lives [place] her husband and [place] in [place].  She used to work in [employer] but is currently on disability.  The patient returns for follow-up after 3 months.  She states that she has not been doing all that well.  She is very sick today with a bad cough and congestion and she is going to her primary doctor's office.  Prior to that her son and his girlfriend had a big altercation around Thanksgiving and they have broken up.  Her son had people coming to her house claiming that they owed him money and making threats.  She states that this "tore up my nerves."  The Xanax has helped considerably as well as talking to her therapist Florencia Reasonseggy Bynum.  She states that things have now settled down in her  son's life but he is staying with her and her husband because he and his girlfriend have broken up and he had nowhere else to live.  She states that in general her mood is stable and she denies thoughts of self-harm or suicide. Visit Diagnosis:    ICD-10-CM   1. Generalized anxiety disorder  F41.1   2. 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 episode (HCC)  F32.1     Past Psychiatric History: none  Past Medical History:  Past Medical History:  Diagnosis Date  . Acid reflux   . Anxiety   . Depression   . Fibromyalgia     Past Surgical History:  Procedure Laterality Date  . CESAREAN SECTION    . MOUTH SURGERY    . tubes tied      Family Psychiatric History: see below  Family History:  Family History  Problem Relation Age of Onset  . Hypertension Father   . Hypertension Brother   . Diabetes Brother   . Depression Brother   . Anxiety disorder Brother   . Stroke Maternal Grandmother   . Stroke Paternal Grandfather   . Congestive Heart Failure Sister   . Diabetes Sister   . Hypertension Sister   . Anxiety disorder Sister   . Depression Sister   . Heart disease Unknown   . Arthritis Unknown   . Cancer Unknown   . Asthma Unknown   . Diabetes Unknown   . Kidney  disease Unknown     Social History:  Social History   Socioeconomic History  . Marital status: Married    Spouse name: Not on file  . Number of children: 2  . Years of education: 67  . Highest education level: Not on file  Occupational History  . Occupation: none    Employer: UNEMPLOYED  . Occupation: Unemployed   Tobacco Use  . Smoking status: Never Smoker  . Smokeless tobacco: Never Used  Substance and Sexual Activity  . Alcohol use: No  . Drug use: No  . Sexual activity: Yes    Birth control/protection: Surgical  Other Topics Concern  . Not on file  Social History Narrative   Patient lives at home with husband and son.    Patient does not work   Patient has a  high school education    Patient has 2 children.   Social Determinants of Health   Financial Resource Strain:   . Difficulty of Paying Living Expenses: Not on file  Food Insecurity:   . Worried About Programme researcher, broadcasting/film/video in the Last Year: Not on file  . Ran Out of Food in the Last Year: Not on file  Transportation Needs:   . Lack of Transportation (Medical): Not on file  . Lack of Transportation (Non-Medical): Not on file  Physical Activity:   . Days of Exercise per Week: Not on file  . Minutes of Exercise per Session: Not on file  Stress:   . Feeling of Stress : Not on file  Social Connections:   . Frequency of Communication with Friends and Family: Not on file  . Frequency of Social Gatherings with Friends and Family: Not on file  . Attends Religious Services: Not on file  . Active Member of Clubs or Organizations: Not on file  . Attends Banker Meetings: Not on file  . Marital Status: Not on file    Allergies:  Allergies  Allergen Reactions  . Prednisone Other (See Comments)    Depression and crying  . Ambien [Zolpidem Tartrate] Other (See Comments)    hallucinations  . Penicillins     Metabolic Disorder Labs: No results found for: HGBA1C, MPG No results found for: PROLACTIN No results found for: CHOL, TRIG, HDL, CHOLHDL, VLDL, LDLCALC Lab Results  Component Value Date   TSH 3.190 11/29/2013    Therapeutic Level Labs: No results found for: LITHIUM No results found for: VALPROATE No components found for:  CBMZ  Current Medications: Current Outpatient Medications  Medication Sig Dispense Refill  . Acetaminophen (TYLENOL 8 HOUR PO) Take by mouth.      Marland Kitchen albuterol (PROVENTIL) (2.5 MG/3ML) 0.083% nebulizer solution Take 2.5 mg by nebulization every 6 (six) hours as needed for wheezing or shortness of breath.    . ALPRAZolam (XANAX) 1 MG tablet Take 1 tablet (1 mg total) by mouth 3 (three) times daily as needed for anxiety. 90 tablet 2  . ARIPiprazole  (ABILIFY) 2 MG tablet Take 1 tablet (2 mg total) by mouth every morning. 30 tablet 2  . baclofen (LIORESAL) 10 MG tablet Take 10 mg by mouth 3 (three) times daily.    . Cholecalciferol (VITAMIN D PO) Take by mouth.    . doxycycline (VIBRAMYCIN) 100 MG capsule Take 100 mg by mouth 2 (two) times daily.    . DULoxetine (CYMBALTA) 60 MG capsule Take 1 capsule (60 mg total) by mouth 2 (two) times daily. 60 capsule 2  . gabapentin (NEURONTIN) 600  MG tablet daily  1  . meloxicam (MOBIC) 15 MG tablet Take 15 mg by mouth daily.      . montelukast (SINGULAIR) 10 MG tablet Take 10 mg by mouth at bedtime.    Marland Kitchen omeprazole (PRILOSEC) 40 MG capsule Take 1 capsule (40 mg total) by mouth daily. Appointment needed for further refills 30 capsule 3  . SUMAtriptan (IMITREX) 25 MG tablet Take 25 mg by mouth every 2 (two) hours as needed for migraine. May repeat in 2 hours if headache persists or recurs.    . temazepam (RESTORIL) 30 MG capsule Take 1 capsule (30 mg total) by mouth at bedtime as needed for sleep. 30 capsule 2  . traMADol (ULTRAM-ER) 300 MG 24 hr tablet Take 300 mg by mouth daily.     No current facility-administered medications for this visit.     Musculoskeletal: Strength & Muscle Tone: within normal limits Gait & Station: normal Patient leans: N/A  Psychiatric Specialty Exam: Review of Systems  HENT: Positive for congestion and sore throat.   Psychiatric/Behavioral: The patient is nervous/anxious.   All other systems reviewed and are negative.   There were no vitals taken for this visit.There is no height or weight on file to calculate BMI.  General Appearance: NA  Eye Contact:  NA  Speech:  Clear and Coherent  Volume:  Normal  Mood:  Anxious  Affect:  NA  Thought Process:  Goal Directed  Orientation:  Full (Time, Place, and Person)  Thought Content: Rumination   Suicidal Thoughts:  No  Homicidal Thoughts:  No  Memory:  Immediate;   Good Recent;   Good Remote;   Fair  Judgement:   Good  Insight:  Fair  Psychomotor Activity:  Decreased  Concentration:  Concentration: Good and Attention Span: Good  Recall:  Good  Fund of Knowledge: Fair  Language: Good  Akathisia:  No  Handed:  Right  AIMS (if indicated): not done  Assets:  Communication Skills Desire for Improvement Resilience Social Support Talents/Skills  ADL's:  Intact  Cognition: WNL  Sleep:  Fair   Screenings: GAD-7     Counselor from 11/10/2017 in [facility]  Total GAD-7 Score  19       Assessment and Plan: This patient is a 53 year old female with a history of depression and anxiety.  She has been stressed due to recent events in her son's life but things have settled down and she seems to be doing better.  She does think the medications are helpful for her depression anxiety and sleep.  She will continue Xanax 1 mg 3 times daily for anxiety, Cymbalta 60 mg twice daily for depression, Abilify 2 mg daily for augmentation and temazepam 30 mg at bedtime for sleep.  She will return to see me in 2 months   Levonne Spiller, MD 04/21/2019, 10:06 AM

## 2019-04-22 ENCOUNTER — Other Ambulatory Visit: Payer: Self-pay

## 2019-04-22 ENCOUNTER — Ambulatory Visit (HOSPITAL_COMMUNITY): Payer: BC Managed Care – PPO | Admitting: Psychiatry

## 2019-05-04 ENCOUNTER — Other Ambulatory Visit: Payer: Self-pay

## 2019-05-04 ENCOUNTER — Telehealth (HOSPITAL_COMMUNITY): Payer: Self-pay | Admitting: Psychiatry

## 2019-05-04 ENCOUNTER — Ambulatory Visit (HOSPITAL_COMMUNITY): Payer: BC Managed Care – PPO | Admitting: Psychiatry

## 2019-05-04 NOTE — Telephone Encounter (Signed)
Therapist called patient for scheduled appointment, received voicemail response indicating patient's mailbox was full.

## 2019-05-24 ENCOUNTER — Other Ambulatory Visit: Payer: Self-pay

## 2019-05-24 ENCOUNTER — Ambulatory Visit (INDEPENDENT_AMBULATORY_CARE_PROVIDER_SITE_OTHER): Payer: BC Managed Care – PPO | Admitting: Psychiatry

## 2019-05-24 DIAGNOSIS — F411 Generalized anxiety disorder: Secondary | ICD-10-CM | POA: Diagnosis not present

## 2019-05-24 NOTE — Progress Notes (Signed)
Virtual Visit via Video Note  I connected with Virginia Trujillo on 05/24/19 at 9:20 AM EST by a video enabled telemedicine application and verified that I am speaking with the correct person using two identifiers.   I discussed the limitations of evaluation and management by telemedicine and the availability of in person appointments. The patient expressed understanding and agreed to proceed.  I provided 45 minutes of non-face-to-face time during this encounter.   Adah Salvage, LCSW                          THERAPIST PROGRESS NOTE  Session Time: Tuesday 05/24/2019 9:20 AM  - 10:05 AM   Participation Level: Active  Behavioral Response: talkative  Type of Therapy: Individual Therapy  Treatment Goals addressed: Learn and implement calming skills to manage and reduce overall anxiety so that it does not interfere with daily functioning,  Identify, challenge, and replace biased, fearful self talk with positive, realistic, and empowering self talk  Interventions: CBT and Supportive  Summary: Virginia Trujillo is a 54 y.o. female who presents with symptoms of anxiety that have been lilfelong per patient's report but symptoms worsened when her mother died 15+ years ago. Also during the same 2 month period around her mother's death,her grandparents and her sister died. Symptoms include isolative behaviors, panic attacks, nervousness, sleep difficulty, memory difficulty, persistent worry, daily crying spells, depressed mood.  Patient last's contact was by virtual visit about 4 weeks ago.  She reports increased stress, anxiety, and worry since last session.  Per her report, she started suspecting her son of using drugs about 2 months ago.  In recent weeks, his behavior became more erratic per her report.  Last week, he began exhibiting withdrawal symptoms.  She contacted a drug rehabilitation program in [place] and sent him there via plane this past weekend.  Upon his arrival, his symptoms worsened and he  experienced psychotic symptoms per her report.  He was sent to a psychiatric facility in [place] yesterday.  Patient had contact from son yesterday.  She fears once he is discharged from the hospital, he will leave the drug rehab facility.  Patient reports sleep difficulty, poor appetite, and crying spells.  However,  Suicidal/Homicidal: None  Therapist Response: reviewed symptoms, discussed stressors, facilitated expression of thoughts and feelings, validated feelings, praised and reinforced patient's active efforts and problem solving skills while still experiencing anxiety, reviewed relaxation techniques, used cognitive defusion to help patient cope with ruminating thoughts about son, assisted patient identify coping statements, encouraged patient to contact drug rehab facility about her concerns and policy about son leaving or remaining at their facility once discharged from hospitalization       Plan: Return again in 2 weeks  Diagnosis: Axis I: Generalized Anxiety Disorder    Axis II: No diagnosis    Adah Salvage, LCSW 05/24/2019

## 2019-06-07 ENCOUNTER — Other Ambulatory Visit: Payer: Self-pay

## 2019-06-07 ENCOUNTER — Ambulatory Visit (INDEPENDENT_AMBULATORY_CARE_PROVIDER_SITE_OTHER): Payer: BC Managed Care – PPO | Admitting: Psychiatry

## 2019-06-07 ENCOUNTER — Encounter (HOSPITAL_COMMUNITY): Payer: Self-pay | Admitting: Psychiatry

## 2019-06-07 DIAGNOSIS — F411 Generalized anxiety disorder: Secondary | ICD-10-CM | POA: Diagnosis not present

## 2019-06-07 NOTE — Progress Notes (Signed)
Virtual Visit via Telephone Note  I connected with Virginia Trujillo on 06/07/19 at  9:00 AM EST by telephone and verified that I am speaking with the correct person using two identifiers.   I discussed the limitations, risks, security and privacy concerns of performing an evaluation and management service by telephone and the availability of in person appointments. I also discussed with the patient that there may be a patient responsible charge related to this service. The patient expressed understanding and agreed to proceed.  .              I provided 55 minutes of non-face-to-face time during this encounter.   Adah Salvage, LCSW                          THERAPIST PROGRESS NOTE  Session Time: Tuesday 06/07/2019 9:00 AM - 9:55 AM   Participation Level: Active  Behavioral Response: talkative  Type of Therapy: Individual Therapy  Treatment Goals addressed: Learn and implement calming skills to manage and reduce overall anxiety so that it does not interfere with daily functioning,  Identify, challenge, and replace biased, fearful self talk with positive, realistic, and empowering self talk  Interventions: CBT and Supportive  Summary: Virginia Trujillo is a 54 y.o. female who presents with symptoms of anxiety that have been lilfelong per patient's report but symptoms worsened when her mother died 15+ years ago. Also during the same 2 month period around her mother's death,her grandparents and her sister died. Symptoms include isolative behaviors, panic attacks, nervousness, sleep difficulty, memory difficulty, persistent worry, daily crying spells, depressed mood.  Patient last's contact was by virtual visit about 2 weeks ago.  She reports continued  anxiety, tearfulness, worry, and sleep difficulty  since last session.  She is relieved her son has returned to the rehabilitation facility and is making progress.  He is scheduled to go to a stepdown program next week.  She is pleased with his  progress but also is experiencing what if thoughts about his behavior when he returns to her home.  She reports improvement in appetite and efforts to increase behavioral activation.  She reports trying to stay busy and enjoying babysitting her grandson  She reports continued strong support from her immediate family and her extended family.   Suicidal/Homicidal: None  Therapist Response: reviewed symptoms, praised and reinforced patient's improved self-care and increased behavioral activation, discussed ways to maintain with use of daily planning schedule, discussed stressors, facilitated expression of thoughts and feelings, validated feelings, assisted patient examine her thought patterns evoking worry and anxiety,  used cognitive defusion to help patient cope with ruminating thoughts about son and future, discussed rationale for and assisted patient practice a mindfulness exercise using breath awareness to help cope with anxiety/ruminating thoughts, also encouraged patient to attend Zoom meeting for parent support group connected to rehab facility where son is residing       Plan: Return again in 2 weeks  Diagnosis: Axis I: Generalized Anxiety Disorder    Axis II: No diagnosis    Adah Salvage, LCSW 06/07/2019

## 2019-06-08 ENCOUNTER — Other Ambulatory Visit (HOSPITAL_COMMUNITY): Payer: Self-pay | Admitting: Psychiatry

## 2019-06-21 ENCOUNTER — Other Ambulatory Visit: Payer: Self-pay

## 2019-06-21 ENCOUNTER — Telehealth (HOSPITAL_COMMUNITY): Payer: Self-pay | Admitting: Psychiatry

## 2019-06-21 ENCOUNTER — Ambulatory Visit (HOSPITAL_COMMUNITY): Payer: BC Managed Care – PPO | Admitting: Psychiatry

## 2019-06-21 NOTE — Telephone Encounter (Signed)
Therapist attempted to contact patient via phone for scheduled appointment and received voicemail message.  Therapist left message indicating attempt and requesting patient call office. 

## 2019-06-27 ENCOUNTER — Other Ambulatory Visit: Payer: Self-pay

## 2019-06-27 ENCOUNTER — Ambulatory Visit (INDEPENDENT_AMBULATORY_CARE_PROVIDER_SITE_OTHER): Payer: BC Managed Care – PPO | Admitting: Psychiatry

## 2019-06-27 ENCOUNTER — Encounter (HOSPITAL_COMMUNITY): Payer: Self-pay | Admitting: Psychiatry

## 2019-06-27 DIAGNOSIS — F411 Generalized anxiety disorder: Secondary | ICD-10-CM | POA: Diagnosis not present

## 2019-06-27 DIAGNOSIS — F321 Major depressive disorder, single episode, moderate: Secondary | ICD-10-CM | POA: Diagnosis not present

## 2019-06-27 MED ORDER — ARIPIPRAZOLE 2 MG PO TABS: 2.0000 mg | ORAL_TABLET | ORAL | 2 refills | Status: DC

## 2019-06-27 MED ORDER — TEMAZEPAM 30 MG PO CAPS: 30.0000 mg | ORAL_CAPSULE | Freq: Every evening | ORAL | 2 refills | Status: DC | PRN

## 2019-06-27 MED ORDER — ALPRAZOLAM 1 MG PO TABS: 1.0000 mg | ORAL_TABLET | Freq: Three times a day (TID) | ORAL | 2 refills | Status: DC | PRN

## 2019-06-27 MED ORDER — DULOXETINE HCL 60 MG PO CPEP: 60.0000 mg | ORAL_CAPSULE | Freq: Two times a day (BID) | ORAL | 2 refills | Status: DC

## 2019-06-27 NOTE — Progress Notes (Signed)
Virtual Visit via Telephone Note  I connected with Virginia Trujillo on 06/27/19 at 11:00 AM EST by telephone and verified that I am speaking with the correct person using two identifiers.   I discussed the limitations, risks, security and privacy concerns of performing an evaluation and management service by telephone and the availability of in person appointments. I also discussed with the patient that there may be a patient responsible charge related to this service. The patient expressed understanding and agreed to proceed.     I discussed the assessment and treatment plan with the patient. The patient was provided an opportunity to ask questions and all were answered. The patient agreed with the plan and demonstrated an understanding of the instructions.   The patient was advised to call back or seek an in-person evaluation if the symptoms worsen or if the condition fails to improve as anticipated.  I provided  minutes of non-face-to-face time during this encounter.   Levonne Spiller, MD  [facility] MD/PA/NP OP Progress Note  06/27/2019 11:12 AM Virginia Trujillo  MRN:  [id number]  Chief Complaint:  Chief Complaint    Depression; Anxiety; Follow-up     HPI: This patient is a 54 year old married black female who lives with her husband and son in [place].  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  The patient returns for follow-up after 3 months.  She states that her 101 year old son had been abusing drugs and alcohol severely.  He began hallucinating and getting at erratic and agitated.  Through her husband's insurance she was able to find him a rehab program in [place] where he has been for the last 45 days.  He is doing much better now and is slated to come home.  She states that this "tore her nerves."  Yet she was able to handle it and get of the proper help.  She states that for the most part she is doing okay now.  She denies serious depression agitation anxiety or  difficulty sleeping. Visit Diagnosis:    ICD-10-CM   1. Generalized anxiety disorder  F41.1   2. 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 episode ([place])  F32.1     Past Psychiatric History: none  Past Medical History:  Past Medical History:  Diagnosis Date  . Acid reflux   . Anxiety   . Depression   . Fibromyalgia     Past Surgical History:  Procedure Laterality Date  . CESAREAN SECTION    . MOUTH SURGERY    . tubes tied      Family Psychiatric History:see below  Family History:  Family History  Problem Relation Age of Onset  . Hypertension Father   . Hypertension Brother   . Diabetes Brother   . Depression Brother   . Anxiety disorder Brother   . Stroke Maternal Grandmother   . Stroke Paternal Grandfather   . Congestive Heart Failure Sister   . Diabetes Sister   . Hypertension Sister   . Anxiety disorder Sister   . Depression Sister   . Heart disease Unknown   . Arthritis Unknown   . Cancer Unknown   . Asthma Unknown   . Diabetes Unknown   . Kidney disease Unknown     Social History:  Social History   Socioeconomic History  . Marital status: Married    Spouse name: Not on file  . Number of children: 2  . Years of education: 60  . Highest  education level: Not on file  Occupational History  . Occupation: none    Employer: UNEMPLOYED  . Occupation: Unemployed   Tobacco Use  . Smoking status: Never Smoker  . Smokeless tobacco: Never Used  Substance and Sexual Activity  . Alcohol use: No  . Drug use: No  . Sexual activity: Yes    Birth control/protection: Surgical  Other Topics Concern  . Not on file  Social History Narrative   Patient lives at home with husband and son.    Patient does not work   Patient has a high school education    Patient has 2 children.   Social Determinants of Health   Financial Resource Strain:   . Difficulty of Paying Living Expenses: Not on file  Food Insecurity:   . Worried About  Programme researcher, broadcasting/film/video in the Last Year: Not on file  . Ran Out of Food in the Last Year: Not on file  Transportation Needs:   . Lack of Transportation (Medical): Not on file  . Lack of Transportation (Non-Medical): Not on file  Physical Activity:   . Days of Exercise per Week: Not on file  . Minutes of Exercise per Session: Not on file  Stress:   . Feeling of Stress : Not on file  Social Connections:   . Frequency of Communication with Friends and Family: Not on file  . Frequency of Social Gatherings with Friends and Family: Not on file  . Attends Religious Services: Not on file  . Active Member of Clubs or Organizations: Not on file  . Attends Banker Meetings: Not on file  . Marital Status: Not on file    Allergies:  Allergies  Allergen Reactions  . Prednisone Other (See Comments)    Depression and crying  . Ambien [Zolpidem Tartrate] Other (See Comments)    hallucinations  . Penicillins     Metabolic Disorder Labs: No results found for: HGBA1C, MPG No results found for: PROLACTIN No results found for: CHOL, TRIG, HDL, CHOLHDL, VLDL, LDLCALC Lab Results  Component Value Date   TSH 3.190 11/29/2013    Therapeutic Level Labs: No results found for: LITHIUM No results found for: VALPROATE No components found for:  CBMZ  Current Medications: Current Outpatient Medications  Medication Sig Dispense Refill  . Acetaminophen (TYLENOL 8 HOUR PO) Take by mouth.      Marland Kitchen albuterol (PROVENTIL) (2.5 MG/3ML) 0.083% nebulizer solution Take 2.5 mg by nebulization every 6 (six) hours as needed for wheezing or shortness of breath.    . ALPRAZolam (XANAX) 1 MG tablet Take 1 tablet (1 mg total) by mouth 3 (three) times daily as needed for anxiety. 90 tablet 2  . ARIPiprazole (ABILIFY) 2 MG tablet Take 1 tablet (2 mg total) by mouth every morning. 30 tablet 2  . baclofen (LIORESAL) 10 MG tablet Take 10 mg by mouth 3 (three) times daily.    . Cholecalciferol (VITAMIN D PO) Take  by mouth.    . doxycycline (VIBRAMYCIN) 100 MG capsule Take 100 mg by mouth 2 (two) times daily.    . DULoxetine (CYMBALTA) 60 MG capsule Take 1 capsule (60 mg total) by mouth 2 (two) times daily. 60 capsule 2  . gabapentin (NEURONTIN) 600 MG tablet daily  1  . meloxicam (MOBIC) 15 MG tablet Take 15 mg by mouth daily.      . montelukast (SINGULAIR) 10 MG tablet Take 10 mg by mouth at bedtime.    Marland Kitchen omeprazole (PRILOSEC) 40  MG capsule Take 1 capsule (40 mg total) by mouth daily. Appointment needed for further refills (Patient not taking: Reported on 06/07/2019) 30 capsule 3  . SUMAtriptan (IMITREX) 25 MG tablet Take 25 mg by mouth every 2 (two) hours as needed for migraine. May repeat in 2 hours if headache persists or recurs.    . temazepam (RESTORIL) 30 MG capsule Take 1 capsule (30 mg total) by mouth at bedtime as needed for sleep. 30 capsule 2  . traMADol (ULTRAM-ER) 300 MG 24 hr tablet Take 300 mg by mouth daily.     No current facility-administered medications for this visit.     Musculoskeletal: Strength & Muscle Tone: within normal limits Gait & Station: normal Patient leans: N/A  Psychiatric Specialty Exam: Review of Systems  Musculoskeletal: Positive for back pain.  Psychiatric/Behavioral: The patient is nervous/anxious.   All other systems reviewed and are negative.   There were no vitals taken for this visit.There is no height or weight on file to calculate BMI.  General Appearance: NA  Eye Contact:  NA  Speech:  Clear and Coherent  Volume:  Normal  Mood:  Anxious  Affect:  NA  Thought Process:  Goal Directed  Orientation:  Full (Time, Place, and Person)  Thought Content: Rumination   Suicidal Thoughts:  No  Homicidal Thoughts:  No  Memory:  Immediate;   Good Recent;   Good Remote;   Good  Judgement:  Good  Insight:  Fair  Psychomotor Activity:  Normal  Concentration:  Concentration: Good and Attention Span: Good  Recall:  Good  Fund of Knowledge: Good   Language: Good  Akathisia:  No  Handed:  Right  AIMS (if indicated): not done  Assets:  Communication Skills Desire for Improvement Physical Health  ADL's:  Intact  Cognition: WNL  Sleep:  Good   Screenings: GAD-7     Counselor from 11/10/2017 in [facility]  Total GAD-7 Score  19       Assessment and Plan: This patient is a 54 year old female with a history of depression and anxiety.  She was very stressed about her son's substance abuse but seems to be doing better now.  She continues to feel that her medications have helped her depression anxiety and sleep.  She will continue Xanax 1 mg 3 times daily for anxiety, Cymbalta 60 mg twice daily for depression, Abilify 2 mg daily for augmentation and temazepam 30 mg at bedtime for sleep.  She will return to see me in 3 months   Diannia Ruder, MD 06/27/2019, 11:12 AM

## 2019-07-05 ENCOUNTER — Ambulatory Visit (INDEPENDENT_AMBULATORY_CARE_PROVIDER_SITE_OTHER): Payer: BC Managed Care – PPO | Admitting: Psychiatry

## 2019-07-05 ENCOUNTER — Other Ambulatory Visit: Payer: Self-pay

## 2019-07-05 DIAGNOSIS — F411 Generalized anxiety disorder: Secondary | ICD-10-CM | POA: Diagnosis not present

## 2019-07-05 NOTE — Progress Notes (Signed)
Virtual Visit via Telephone Note  I connected with Virginia Trujillo on 07/05/19 at 11:10 AM EST  by telephone and verified that I am speaking with the correct person using two identifiers.   I discussed the limitations, risks, security and privacy concerns of performing an evaluation and management service by telephone and the availability of in person appointments. I also discussed with the patient that there may be a patient responsible charge related to this service. The patient expressed understanding and agreed to proceed.    The patient was advised to call back or seek an in-person evaluation if the symptoms worsen or if the condition fails to improve as anticipated.  I provided 40  minutes of non-face-to-face time during this encounter.   Adah Salvage, LCSW                           THERAPIST PROGRESS NOTE  Session Time: Tuesday 3/22021 11:10 AM - 11:50 AM   Participation Level: Active  Behavioral Response: talkative  Type of Therapy: Individual Therapy  Treatment Goals addressed: Learn and implement calming skills to manage and reduce overall anxiety so that it does not interfere with daily functioning,  Identify, challenge, and replace biased, fearful self talk with positive, realistic, and empowering self talk  Interventions: CBT and Supportive  Summary: Virginia Trujillo is a 54 y.o. female who presents with symptoms of anxiety that have been lilfelong per patient's report but symptoms worsened when her mother died 15+ years ago. Also during the same 2 month period around her mother's death,her grandparents and her sister died. Symptoms include isolative behaviors, panic attacks, nervousness, sleep difficulty, memory difficulty, persistent worry, daily crying spells, depressed mood.        Patient last's contact was by virtual visit about 4 weeks ago.  She reports mild symptoms of depression and anxiety.  She reports decreased worry, decreased crying spells and improved mood.   She reports continued poor sleep pattern and difficulty falling/staying asleep.  She is pleased her son is coming home tomorrow from the drug rehab program.  She reports no longer having the what if thoughts about his future as she states accepting she cannot control the future and states letting go of her worry thoughts.  She also reports she did express her concerns to son assertively about life at home after rehab.  She reports she did attend the Zoom parent support group and says this was very helpful.  She plans to continue to attend.  Reports increased behavioral activation regarding various household tasks and reports continuing to enjoy babysitting her grandson.  She reports having 1 down day since last session and reports that the suicide of a young man in her community as being the trigger.  Patient reports being tearful most of that day but using self talk, her spirituality, thought stopping, and behavioral activation to cope.  She reports increased confidence in her ability to cope with anxiety and depression.  She reports continued strong support from her family.    Suicidal/Homicidal: None  Therapist Response: reviewed symptoms, praised and reinforced patient's use of healthy coping techniques to manage anxiety and depression, discussed patient's use of cognitive defusion and the effects, reviewed use of mindfulness and techniques to be more aware of thoughts/emotions/behaviors to respond rather than react,  discussed feelings of sadness and normalized/validated feelings related to the recent suicide, reviewed lapse versus relapse, discussed maintaining consistency regarding self care and  use  of helpful coping strategies    Plan: Return again in 2 weeks  Diagnosis: Axis I: Generalized Anxiety Disorder    Axis II: No diagnosis    [name], LCSW 07/05/2019

## 2019-09-12 ENCOUNTER — Ambulatory Visit (INDEPENDENT_AMBULATORY_CARE_PROVIDER_SITE_OTHER): Payer: BC Managed Care – PPO | Admitting: Psychiatry

## 2019-09-12 ENCOUNTER — Other Ambulatory Visit: Payer: Self-pay

## 2019-09-12 DIAGNOSIS — F411 Generalized anxiety disorder: Secondary | ICD-10-CM | POA: Diagnosis not present

## 2019-09-12 NOTE — Progress Notes (Signed)
Virtual Visit via Telephone Note  I connected with Virginia Trujillo on 09/12/19 at 11:15 AM EDT  by telephone and verified that I am speaking with the correct person using two identifiers.   I discussed the limitations, risks, security and privacy concerns of performing an evaluation and management service by telephone and the availability of in person appointments. I also discussed with the patient that there may be a patient responsible charge related to this service. The patient expressed understanding and agreed to proceed.   I provided 45 minutes of non-face-to-face time during this encounter.   Adah Salvage, LCSW                           THERAPIST PROGRESS NOTE  Session Time: Monday 09/12/2019 11:15 AM - 12:00 PM         Participation Level: Active  Behavioral Response: talkative  Type of Therapy: Individual Therapy  Treatment Goals addressed: Learn and implement calming skills to manage and reduce overall anxiety so that it does not interfere with daily functioning,  Identify, challenge, and replace biased, fearful self talk with positive, realistic, and empowering self talk  Interventions: CBT and Supportive  Summary: Virginia Trujillo is a 54 y.o. female who presents with symptoms of anxiety that have been lilfelong per patient's report but symptoms worsened when her mother died 15+ years ago. Also during the same 2 month period around her mother's death,her grandparents and her sister died. Symptoms include isolative behaviors, panic attacks, nervousness, sleep difficulty, memory difficulty, persistent worry, daily crying spells, depressed mood.        Patient last's contact was by virtual visit about 6 -7  weeks ago.  She reports moderate symptoms of depression and anxiety including depressed mood, insomnia, crying s, excessive worry, loss of appetite, and panic attacks.  She also reports having fleeting suicidal ideations a couple of weeks ago but reports taking her medication  and talking to her husband to cope.  She denies any suicidal ideation since that time.  Per patient's report, triggers include son's relapse and charges being filed against son for incidents that took place several months ago.  She reports she and her husband have retained an attorney for her son and anticipates court case will be continued her son now is back in drug rehab in [place].  Patient reports now feeling a little better but still worrying about son's future.  She reports additional stress related to having side effects from Covid vaccine, recently falling, and being brother's  support person along with attending education classes in his process of being a candidate for a kidney transplant.  Patient reports strong support from family and friends.  She reports she has tried to increase involvement with family and friends and says this helps.  Suicidal/Homicidal: None  Therapist Response: reviewed symptoms, discussed stressors, facilitated expression of thoughts and feelings, validated feelings, reviewed relaxation techniques and developed plan for patient to resume practicing deep breathing daily, also assisted patient identify coping statements using her spirituality, praised and reinforced patient's involvement in activity and socialization with family or friends, developed plan with patient to continue to do this daily.   Plan: Return again in 2 weeks  Diagnosis: Axis I: Generalized Anxiety Disorder    Axis II: No diagnosis    Adah Salvage, LCSW 09/12/2019

## 2019-09-21 DIAGNOSIS — Z6841 Body Mass Index (BMI) 40.0 and over, adult: Secondary | ICD-10-CM | POA: Diagnosis not present

## 2019-09-21 DIAGNOSIS — Z1389 Encounter for screening for other disorder: Secondary | ICD-10-CM | POA: Diagnosis not present

## 2019-09-21 DIAGNOSIS — J302 Other seasonal allergic rhinitis: Secondary | ICD-10-CM | POA: Diagnosis not present

## 2019-09-21 DIAGNOSIS — G894 Chronic pain syndrome: Secondary | ICD-10-CM | POA: Diagnosis not present

## 2019-09-26 ENCOUNTER — Ambulatory Visit (INDEPENDENT_AMBULATORY_CARE_PROVIDER_SITE_OTHER): Payer: BC Managed Care – PPO | Admitting: Psychiatry

## 2019-09-26 ENCOUNTER — Encounter (HOSPITAL_COMMUNITY): Payer: Self-pay | Admitting: Psychiatry

## 2019-09-26 ENCOUNTER — Telehealth (INDEPENDENT_AMBULATORY_CARE_PROVIDER_SITE_OTHER): Payer: BC Managed Care – PPO | Admitting: Psychiatry

## 2019-09-26 ENCOUNTER — Other Ambulatory Visit: Payer: Self-pay

## 2019-09-26 DIAGNOSIS — F321 Major depressive disorder, single episode, moderate: Secondary | ICD-10-CM | POA: Diagnosis not present

## 2019-09-26 DIAGNOSIS — F411 Generalized anxiety disorder: Secondary | ICD-10-CM

## 2019-09-26 MED ORDER — ALPRAZOLAM 1 MG PO TABS: 1.0000 mg | ORAL_TABLET | Freq: Three times a day (TID) | ORAL | 2 refills | Status: DC | PRN

## 2019-09-26 MED ORDER — TEMAZEPAM 30 MG PO CAPS: 30.0000 mg | ORAL_CAPSULE | Freq: Every evening | ORAL | 2 refills | Status: DC | PRN

## 2019-09-26 MED ORDER — ARIPIPRAZOLE 2 MG PO TABS: 2.0000 mg | ORAL_TABLET | ORAL | 2 refills | Status: DC

## 2019-09-26 MED ORDER — DULOXETINE HCL 60 MG PO CPEP: 60.0000 mg | ORAL_CAPSULE | Freq: Two times a day (BID) | ORAL | 2 refills | Status: DC

## 2019-09-26 NOTE — Progress Notes (Signed)
Virtual Visit via Telephone Note  I connected with Virginia Trujillo on 09/26/19 at  2:00 PM EDT by telephone and verified that I am speaking with the correct person using two identifiers.   I discussed the limitations, risks, security and privacy concerns of performing an evaluation and management service by telephone and the availability of in person appointments. I also discussed with the patient that there may be a patient responsible charge related to this service. The patient expressed understanding and agreed to proceed.    I discussed the assessment and treatment plan with the patient. The patient was provided an opportunity to ask questions and all were answered. The patient agreed with the plan and demonstrated an understanding of the instructions.   The patient was advised to call back or seek an in-person evaluation if the symptoms worsen or if the condition fails to improve as anticipated.  I provided 15 minutes of non-face-to-face time during this encounter.   Diannia Ruder, MD  [facility] MD/PA/NP OP Progress Note  09/26/2019 2:15 PM RINIYAH SPEICH  MRN:  [id number]  Chief Complaint:  Chief Complaint    Depression; Anxiety; Follow-up     HPI: This patient is a 54 year old married black female who lives with her husband and son in [place]. 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  The patient returns for follow-up after 3 months. She states that she has had some difficult time since I last saw her. Her 60 year old son had been in drug rehab in [place] when I last spoke to her. Apparently he came home after about a month and began abusing drugs again after he received an oxycodone prescription after her neck surgery. He is now back in [place] in the rehab program again. The patient states that she is just now getting back to herself. She again went through periods of severe anxiety and insomnia. She states that therapist Florencia Reasons in our office helped her  tremendously get through this. She still thinks the medications are helpful but wonders if the Cymbalta is causing some nausea. It does not seem to happen every time she takes it but I suggested that she take it with food. She is starting to sleep well again. The Xanax has helped her considerably with anxiety Visit Diagnosis:    ICD-10-CM   1. Generalized anxiety disorder  F41.1   2. 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 episode (HCC)  F32.1     Past Psychiatric History: none  Past Medical History:  Past Medical History:  Diagnosis Date  . Acid reflux   . Anxiety   . Depression   . Fibromyalgia     Past Surgical History:  Procedure Laterality Date  . CESAREAN SECTION    . MOUTH SURGERY    . tubes tied      Family Psychiatric History: see below  Family History:  Family History  Problem Relation Age of Onset  . Hypertension Father   . Hypertension Brother   . Diabetes Brother   . Depression Brother   . Anxiety disorder Brother   . Stroke Maternal Grandmother   . Stroke Paternal Grandfather   . Congestive Heart Failure Sister   . Diabetes Sister   . Hypertension Sister   . Anxiety disorder Sister   . Depression Sister   . Heart disease Unknown   . Arthritis Unknown   . Cancer Unknown   . Asthma Unknown   . Diabetes Unknown   .  Kidney disease Unknown     Social History:  Social History   Socioeconomic History  . Marital status: Married    Spouse name: Not on file  . Number of children: 2  . Years of education: 2  . Highest education level: Not on file  Occupational History  . Occupation: none    Employer: UNEMPLOYED  . Occupation: Unemployed   Tobacco Use  . Smoking status: Never Smoker  . Smokeless tobacco: Never Used  Substance and Sexual Activity  . Alcohol use: No  . Drug use: No  . Sexual activity: Yes    Birth control/protection: Surgical  Other Topics Concern  . Not on file  Social History Narrative   Patient  lives at home with husband and son.    Patient does not work   Patient has a high school education    Patient has 2 children.   Social Determinants of Health   Financial Resource Strain:   . Difficulty of Paying Living Expenses:   Food Insecurity:   . Worried About Programme researcher, broadcasting/film/video in the Last Year:   . Barista in the Last Year:   Transportation Needs:   . Freight forwarder (Medical):   Marland Kitchen Lack of Transportation (Non-Medical):   Physical Activity:   . Days of Exercise per Week:   . Minutes of Exercise per Session:   Stress:   . Feeling of Stress :   Social Connections:   . Frequency of Communication with Friends and Family:   . Frequency of Social Gatherings with Friends and Family:   . Attends Religious Services:   . Active Member of Clubs or Organizations:   . Attends Banker Meetings:   Marland Kitchen Marital Status:     Allergies:  Allergies  Allergen Reactions  . Prednisone Other (See Comments)    Depression and crying  . Ambien [Zolpidem Tartrate] Other (See Comments)    hallucinations  . Penicillins     Metabolic Disorder Labs: No results found for: HGBA1C, MPG No results found for: PROLACTIN No results found for: CHOL, TRIG, HDL, CHOLHDL, VLDL, LDLCALC Lab Results  Component Value Date   TSH 3.190 11/29/2013    Therapeutic Level Labs: No results found for: LITHIUM No results found for: VALPROATE No components found for:  CBMZ  Current Medications: Current Outpatient Medications  Medication Sig Dispense Refill  . Acetaminophen (TYLENOL 8 HOUR PO) Take by mouth.      Marland Kitchen albuterol (PROVENTIL) (2.5 MG/3ML) 0.083% nebulizer solution Take 2.5 mg by nebulization every 6 (six) hours as needed for wheezing or shortness of breath.    . ALPRAZolam (XANAX) 1 MG tablet Take 1 tablet (1 mg total) by mouth 3 (three) times daily as needed for anxiety. 90 tablet 2  . ARIPiprazole (ABILIFY) 2 MG tablet Take 1 tablet (2 mg total) by mouth every morning.  30 tablet 2  . baclofen (LIORESAL) 10 MG tablet Take 10 mg by mouth 3 (three) times daily.    . Cholecalciferol (VITAMIN D PO) Take by mouth.    . doxycycline (VIBRAMYCIN) 100 MG capsule Take 100 mg by mouth 2 (two) times daily.    . DULoxetine (CYMBALTA) 60 MG capsule Take 1 capsule (60 mg total) by mouth 2 (two) times daily. 60 capsule 2  . gabapentin (NEURONTIN) 600 MG tablet daily  1  . meloxicam (MOBIC) 15 MG tablet Take 15 mg by mouth daily.      . montelukast (SINGULAIR) 10  MG tablet Take 10 mg by mouth at bedtime.    Marland Kitchen omeprazole (PRILOSEC) 40 MG capsule Take 1 capsule (40 mg total) by mouth daily. Appointment needed for further refills (Patient not taking: Reported on 06/07/2019) 30 capsule 3  . SUMAtriptan (IMITREX) 25 MG tablet Take 25 mg by mouth every 2 (two) hours as needed for migraine. May repeat in 2 hours if headache persists or recurs.    . temazepam (RESTORIL) 30 MG capsule Take 1 capsule (30 mg total) by mouth at bedtime as needed for sleep. 30 capsule 2  . traMADol (ULTRAM-ER) 300 MG 24 hr tablet Take 300 mg by mouth daily.     No current facility-administered medications for this visit.     Musculoskeletal: Strength & Muscle Tone: within normal limits Gait & Station: normal Patient leans: N/A  Psychiatric Specialty Exam: Review of Systems  Gastrointestinal: Positive for abdominal pain.  Psychiatric/Behavioral: Positive for sleep disturbance. The patient is nervous/anxious.   All other systems reviewed and are negative.   There were no vitals taken for this visit.There is no height or weight on file to calculate BMI.  General Appearance: NA  Eye Contact:  NA  Speech:  Clear and Coherent  Volume:  Normal  Mood:  Anxious  Affect:  NA  Thought Process:  Goal Directed  Orientation:  Full (Time, Place, and Person)  Thought Content: Rumination   Suicidal Thoughts:  No  Homicidal Thoughts:  No  Memory:  Immediate;   Good Recent;   Good Remote;   Fair   Judgement:  Good  Insight:  Fair  Psychomotor Activity:  Decreased  Concentration:  Concentration: Good and Attention Span: Good  Recall:  Good  Fund of Knowledge: Fair  Language: Good  Akathisia:  No  Handed:  Right  AIMS (if indicated): not done  Assets:  Communication Skills Desire for Improvement Resilience Social Support Talents/Skills  ADL's:  Intact  Cognition: WNL  Sleep:  Fair   Screenings: GAD-7     Counselor from 11/10/2017 in [facility]  Total GAD-7 Score  19       Assessment and Plan: This patient is a 54 year old female with a history of depression and anxiety. She again became very stressed about her son's issues but she is doing better now. She will continue Xanax 1 mg three times daily for anxiety, Cymbalta 60 mg twice daily for depression, Abilify 2 mg daily for augmentation and temazepam 30 mg at bedtime for sleep. She was advised to take the Cymbalta with food to avoid abdominal pain. She will return to see me in 3 months   Levonne Spiller, MD 09/26/2019, 2:15 PM

## 2019-09-26 NOTE — Progress Notes (Signed)
Virtual Visit via Telephone Note  I connected with Virginia Trujillo on 09/26/19 at 10:10 AM EDT  by telephone and verified that I am speaking with the correct person using two identifiers.   I discussed the limitations, risks, security and privacy concerns of performing an evaluation and management service by telephone and the availability of in person appointments. I also discussed with the patient that there may be a patient responsible charge related to this service. The patient expressed understanding and agreed to proceed.   I provided 35 minutes of non-face-to-face time during this encounter.   Virginia Salvage, LCSW                            THERAPIST PROGRESS NOTE  Session Time: Monday 09/26/2019 10:10 AM - 10:45 AM   Participation Level: Active  Behavioral Response: talkative  Type of Therapy: Individual Therapy  Treatment Goals addressed: Learn and implement calming skills to manage and reduce overall anxiety so that it does not interfere with daily functioning,  Identify, challenge, and replace biased, fearful self talk with positive, realistic, and empowering self talk  Interventions: CBT and Supportive  Summary: [name] is a 54 y.o. female who presents with symptoms of anxiety that have been lilfelong per patient's report but symptoms worsened when her mother died 15+ years ago. Also during the same 2 month period around her mother's death,her grandparents and her sister died. Symptoms include isolative behaviors, panic attacks, nervousness, sleep difficulty, memory difficulty, persistent worry, daily crying spells, depressed mood.        Patient last's contact was by virtual visit about 2 weeks ago.  She is experiencing minimal symptoms of depression and anxiety.  She reports much improved mood and decreased worrying.  She reports decreased stress as son is doing well in rehab program.  He recently was moved to a stepdown program.  Patient has resumed normal involvement  in activities including doing light household tasks, babysitting her grandson, and socializing with family and friends.  She also continues to assist her brother.  She reports continued strong support from family and friends.  Patient reports she has actively been using alternative statements to to replace anxiety provoking statements and reports this has been helpful.  She also continues to use her spirituality.   Suicidal/Homicidal: None  Therapist Response: reviewed symptoms, praised and reinforced patient's increased behavioral activation/socialization with family and friends/and her efforts to identify, challenge, and replace anxiety provoking thoughts with more helpful thoughts, reviewed lapse versus relapse, discussed triggers for last episode, discussed the role of daily self-care and consistent involvement in activity, will send patient handout on early warning signs of depression in preparation for next session, also began to discuss patient's feelings about step down plan for treatment,    Plan: Return again in 2 weeks  Diagnosis: Axis I: Generalized Anxiety Disorder    Axis II: No diagnosis    Virginia Salvage, LCSW 09/26/2019

## 2019-10-10 ENCOUNTER — Ambulatory Visit (INDEPENDENT_AMBULATORY_CARE_PROVIDER_SITE_OTHER): Payer: BC Managed Care – PPO | Admitting: Psychiatry

## 2019-10-10 ENCOUNTER — Other Ambulatory Visit: Payer: Self-pay

## 2019-10-10 DIAGNOSIS — F411 Generalized anxiety disorder: Secondary | ICD-10-CM

## 2019-10-10 NOTE — Progress Notes (Addendum)
Virtual Visit via Telephone Note  I connected with Virginia Trujillo on 10/10/19 at 10:11 AM EDT by telephone and verified that I am speaking with the correct person using two identifiers.   I discussed the limitations, risks, security and privacy concerns of performing an evaluation and management service by telephone and the availability of in person appointments. I also discussed with the patient that there may be a patient responsible charge related to this service. The patient expressed understanding and agreed to proceed.    I provided 41 minutes of non-face-to-face time during this encounter.   Adah Salvage, LCSW                             THERAPIST PROGRESS NOTE   Location:  Patient- Home/ Provider - [facility] [place] office   Session Time: Monday 10/10/2019 10:11 AM - 10:52 AM   Participation Level: Active  Behavioral Response: talkative  Type of Therapy: Individual Therapy  Treatment Goals addressed: Learn and implement calming skills to manage and reduce overall anxiety so that it does not interfere with daily functioning,  Identify, challenge, and replace biased, fearful self talk with positive, realistic, and empowering self talk  Interventions: CBT and Supportive  Summary: Virginia Trujillo is a 53 y.o. female who presents with symptoms of anxiety that have been lilfelong per patient's report but symptoms worsened when her mother died 15+ years ago. Also during the same 2 month period around her mother's death,her grandparents and her sister died. Symptoms include isolative behaviors, panic attacks, nervousness, sleep difficulty, memory difficulty, persistent worry, daily crying spells, depressed mood.                Patient last's contact was by virtual visit about 2 weeks ago.  She is experiencing minimal symptoms of depression and anxiety.  She reports continued improved mood and decreased worrying.  Per her report, her son is continuing to do well in the rehab  program.  Patient has maintained normal involvement in activities including doing light household tasks, babysitting her grandson, and socializing with family and friends.  She also is looking forward to going on a family vacation the end of July.  She reports experiencing increased pain in her shoulder and arm and this has caused her to feel a little down.  However, she has contacted her doctor's office and hopes to have an appointment in the near future.  However, she is trying to cope with this by doing activities like reading and visiting as well as talking with her support system.  Patient is pleased with her progress in therapy.Patient reports she did not receive handouts in the mail.    Suicidal/Homicidal: None  Therapist Response: reviewed symptoms, praised and reinforced patient's continued behavioral activation/socialization with family and friends/and her efforts to identify, challenge, and replace anxiety provoking thoughts and depressive with more helpful thoughts, reviewed lapse versus relapse, encouraged daily self-care and consistent involvement in activity, assisted patient identify early warning signs of depression and strategies to intervene, will send patient another handout on early warning signs of depression in preparation for next session, assigned patient to complete and review regularly, will discuss mindfulness and the window of tolerance next session, processed patient's feelings about stepdown plan to include 2 more sessions.   Plan: Return again in 2 weeks  Diagnosis: Axis I: Generalized Anxiety Disorder    Axis II: No diagnosis    Elisa Sorlie E Awais Cobarrubias, LCSW  10/10/2019 

## 2019-10-11 ENCOUNTER — Telehealth (HOSPITAL_COMMUNITY): Payer: Self-pay | Admitting: *Deleted

## 2019-10-11 NOTE — Telephone Encounter (Signed)
Called patient to scheduled 1 more appt in the 1st week of August per Florencia Reasons. Staff was unable to reach pt and LMOM and office number was provided on the mobile number. Staff called the home number on file and message stated VM is full.

## 2019-11-10 ENCOUNTER — Telehealth (HOSPITAL_COMMUNITY): Payer: Self-pay | Admitting: Psychiatry

## 2019-11-10 ENCOUNTER — Ambulatory Visit (HOSPITAL_COMMUNITY): Payer: BC Managed Care – PPO | Admitting: Psychiatry

## 2019-11-10 ENCOUNTER — Other Ambulatory Visit: Payer: Self-pay

## 2019-11-10 NOTE — Telephone Encounter (Signed)
Therapist attempted to contact patient via her home phone for scheduled appointment but received voicemail indicating mailbox is full.  Therapist then called patient on her cell phone, left message indicating attempt to contact patient and requesting patient call office.

## 2019-12-26 ENCOUNTER — Ambulatory Visit (INDEPENDENT_AMBULATORY_CARE_PROVIDER_SITE_OTHER): Payer: BC Managed Care – PPO | Admitting: Psychiatry

## 2019-12-26 ENCOUNTER — Other Ambulatory Visit: Payer: Self-pay

## 2019-12-26 DIAGNOSIS — F411 Generalized anxiety disorder: Secondary | ICD-10-CM | POA: Diagnosis not present

## 2019-12-26 NOTE — Progress Notes (Signed)
Virtual Visit via Telephone Note  I connected with Khrystal Jeanmarie Graley on 12/26/19 at 11:10 AM EDT by telephone and verified that I am speaking with the correct person using two identifiers.   I discussed the limitations, risks, security and privacy concerns of performing an evaluation and management service by telephone and the availability of in person appointments. I also discussed with the patient that there may be a patient responsible charge related to this service. The patient expressed understanding and agreed to proceed.  I provided 34 minutes of non-face-to-face time during this encounter.   Adah Salvage, LCSW                              THERAPIST PROGRESS NOTE   Location:  Patient- Home/ Provider - [facility] Out[facility] [place] office   Session Time: Monday 12/26/2019 11:10 AM - 11:44 AM   Participation Level: Active  Behavioral Response: talkative  Type of Therapy: Individual Therapy  Treatment Goals addressed: Learn and implement calming skills to manage and reduce overall anxiety so that it does not interfere with daily functioning,  Identify, challenge, and replace biased, fearful self talk with positive, realistic, and empowering self talk  Interventions: CBT and Supportive  Summary: Virginia Trujillo is a 54 y.o. female who presents with symptoms of anxiety that have been lilfelong per patient's report but symptoms worsened when her mother died 15+ years ago. Also during the same 2 month period around her mother's death,her grandparents and her sister died. Symptoms include isolative behaviors, panic attacks, nervousness, sleep difficulty, memory difficulty, persistent worry, daily crying spells, depressed mood.                Patient last's contact was by virtual visit about 2 1/2 months ago.  She is experiencing minimal symptoms of depression and anxiety.  She reports facing a variety of stressors but managing well using deep breathing, support from her family, her faith,  self talk, and reviewing handouts from previous sessions as coping tools.  She continues to have concern about her son who is in a rehab program but is not overwhelmed by this.  Patient remains involved in various activities including light household tasks, attending church, keeping her grandson, and taking her brother to medical appointments.  She also has been socializing with family and friends and is looking forward to future family events.   Patient is pleased with her progress in therapy and expresses confidence in her ability to use coping skills.     Suicidal/Homicidal: None  Therapist Response: reviewed symptoms, praised and reinforced patient's continued behavioral activation/socialization with family and friends/and her efforts to identify, challenge, and replace anxiety provoking thoughts and depressive with more helpful thoughts, reviewed lapse versus relapse, reviewed mindfulness and the window of tolerance and ways to use to regulate emotions, processed patient's feelings about termination at next session, will send patient handout (mental health maintenance plan) in preparation for next session   Plan: Return again in 2 weeks  Diagnosis: Axis I: Generalized Anxiety Disorder    Axis II: No diagnosis    Adah Salvage, LCSW 12/26/2019

## 2019-12-29 ENCOUNTER — Encounter (HOSPITAL_COMMUNITY): Payer: Self-pay | Admitting: Psychiatry

## 2019-12-29 ENCOUNTER — Other Ambulatory Visit: Payer: Self-pay

## 2019-12-29 ENCOUNTER — Telehealth (INDEPENDENT_AMBULATORY_CARE_PROVIDER_SITE_OTHER): Payer: BC Managed Care – PPO | Admitting: Psychiatry

## 2019-12-29 DIAGNOSIS — F321 Major depressive disorder, single episode, moderate: Secondary | ICD-10-CM | POA: Diagnosis not present

## 2019-12-29 DIAGNOSIS — F411 Generalized anxiety disorder: Secondary | ICD-10-CM | POA: Diagnosis not present

## 2019-12-29 MED ORDER — ARIPIPRAZOLE 2 MG PO TABS
2.0000 mg | ORAL_TABLET | ORAL | 2 refills | Status: DC
Start: 2019-12-29 — End: 2020-03-27

## 2019-12-29 MED ORDER — TEMAZEPAM 30 MG PO CAPS: 30.0000 mg | ORAL_CAPSULE | Freq: Every evening | ORAL | 2 refills | Status: DC | PRN

## 2019-12-29 MED ORDER — DULOXETINE HCL 60 MG PO CPEP: 60.0000 mg | ORAL_CAPSULE | Freq: Two times a day (BID) | ORAL | 2 refills | Status: DC

## 2019-12-29 MED ORDER — ALPRAZOLAM 1 MG PO TABS: 1.0000 mg | ORAL_TABLET | Freq: Three times a day (TID) | ORAL | 2 refills | Status: DC | PRN

## 2019-12-29 NOTE — Progress Notes (Signed)
Virtual Visit via Telephone Note  I connected with Virginia Trujillo on 12/29/19 at 10:20 AM EDT by telephone and verified that I am speaking with the correct person using two identifiers.   I discussed the limitations, risks, security and privacy concerns of performing an evaluation and management service by telephone and the availability of in person appointments. I also discussed with the patient that there may be a patient responsible charge related to this service. The patient expressed understanding and agreed to proceed.   I discussed the assessment and treatment plan with the patient. The patient was provided an opportunity to ask questions and all were answered. The patient agreed with the plan and demonstrated an understanding of the instructions.   The patient was advised to call back or seek an in-person evaluation if the symptoms worsen or if the condition fails to improve as anticipated.  I provided 15 minutes of non-face-to-face time during this encounter. Location: Provider Home, patient home  Diannia Ruder, MD  [facility] MD/PA/NP OP Progress Note  12/29/2019 10:44 AM Virginia Trujillo  MRN:  [id number]  Chief Complaint:  Chief Complaint    Depression; Anxiety; Follow-up     HPI: This patient is a 54 year old married black female who lives with her husband and son in [place].  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  The patient returns for follow-up after 3 months.  For the most part she is doing okay.  Her son was had significant drug problems is in a rehab program in [place] right now.  Her main concern now is that her arm has swollen around the shoulder area and it keeps coming back.  She is going to see her primary care physician today.  She is also having significant back pain.  These 2 things keep waking her up at night.  Overall however her mood is good her anxiety is under good control.  She is working with her therapist Florencia Reasons on ways to manage her  anxiety.  She denies any thoughts of self-harm Visit Diagnosis:    ICD-10-CM   1. Generalized anxiety disorder  F41.1   2. 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 episode (HCC)  F32.1     Past Psychiatric History: none  Past Medical History:  Past Medical History:  Diagnosis Date  . Acid reflux   . Anxiety   . Depression   . Fibromyalgia     Past Surgical History:  Procedure Laterality Date  . CESAREAN SECTION    . MOUTH SURGERY    . tubes tied      Family Psychiatric History: see below  Family History:  Family History  Problem Relation Age of Onset  . Hypertension Father   . Hypertension Brother   . Diabetes Brother   . Depression Brother   . Anxiety disorder Brother   . Stroke Maternal Grandmother   . Stroke Paternal Grandfather   . Congestive Heart Failure Sister   . Diabetes Sister   . Hypertension Sister   . Anxiety disorder Sister   . Depression Sister   . Heart disease Unknown   . Arthritis Unknown   . Cancer Unknown   . Asthma Unknown   . Diabetes Unknown   . Kidney disease Unknown     Social History:  Social History   Socioeconomic History  . Marital status: Married    Spouse name: Not on file  . Number of children: 2  . Years  of education: 89  . Highest education level: Not on file  Occupational History  . Occupation: none    Employer: UNEMPLOYED  . Occupation: Unemployed   Tobacco Use  . Smoking status: Never Smoker  . Smokeless tobacco: Never Used  Substance and Sexual Activity  . Alcohol use: No  . Drug use: No  . Sexual activity: Yes    Birth control/protection: Surgical  Other Topics Concern  . Not on file  Social History Narrative   Patient lives at home with husband and son.    Patient does not work   Patient has a high school education    Patient has 2 children.   Social Determinants of Health   Financial Resource Strain:   . Difficulty of Paying Living Expenses: Not on file  Food  Insecurity:   . Worried About Programme researcher, broadcasting/film/video in the Last Year: Not on file  . Ran Out of Food in the Last Year: Not on file  Transportation Needs:   . Lack of Transportation (Medical): Not on file  . Lack of Transportation (Non-Medical): Not on file  Physical Activity:   . Days of Exercise per Week: Not on file  . Minutes of Exercise per Session: Not on file  Stress:   . Feeling of Stress : Not on file  Social Connections:   . Frequency of Communication with Friends and Family: Not on file  . Frequency of Social Gatherings with Friends and Family: Not on file  . Attends Religious Services: Not on file  . Active Member of Clubs or Organizations: Not on file  . Attends Banker Meetings: Not on file  . Marital Status: Not on file    Allergies:  Allergies  Allergen Reactions  . Prednisone Other (See Comments)    Depression and crying  . Ambien [Zolpidem Tartrate] Other (See Comments)    hallucinations  . Penicillins     Metabolic Disorder Labs: No results found for: HGBA1C, MPG No results found for: PROLACTIN No results found for: CHOL, TRIG, HDL, CHOLHDL, VLDL, LDLCALC Lab Results  Component Value Date   TSH 3.190 11/29/2013    Therapeutic Level Labs: No results found for: LITHIUM No results found for: VALPROATE No components found for:  CBMZ  Current Medications: Current Outpatient Medications  Medication Sig Dispense Refill  . Acetaminophen (TYLENOL 8 HOUR PO) Take by mouth.      Marland Kitchen albuterol (PROVENTIL) (2.5 MG/3ML) 0.083% nebulizer solution Take 2.5 mg by nebulization every 6 (six) hours as needed for wheezing or shortness of breath.    . ALPRAZolam (XANAX) 1 MG tablet Take 1 tablet (1 mg total) by mouth 3 (three) times daily as needed for anxiety. 90 tablet 2  . ARIPiprazole (ABILIFY) 2 MG tablet Take 1 tablet (2 mg total) by mouth every morning. 30 tablet 2  . baclofen (LIORESAL) 10 MG tablet Take 10 mg by mouth 3 (three) times daily.    .  Cholecalciferol (VITAMIN D PO) Take by mouth.    . doxycycline (VIBRAMYCIN) 100 MG capsule Take 100 mg by mouth 2 (two) times daily.    . DULoxetine (CYMBALTA) 60 MG capsule Take 1 capsule (60 mg total) by mouth 2 (two) times daily. 60 capsule 2  . gabapentin (NEURONTIN) 600 MG tablet daily  1  . meloxicam (MOBIC) 15 MG tablet Take 15 mg by mouth daily.      . montelukast (SINGULAIR) 10 MG tablet Take 10 mg by mouth at bedtime.    Marland Kitchen  omeprazole (PRILOSEC) 40 MG capsule Take 1 capsule (40 mg total) by mouth daily. Appointment needed for further refills (Patient not taking: Reported on 06/07/2019) 30 capsule 3  . SUMAtriptan (IMITREX) 25 MG tablet Take 25 mg by mouth every 2 (two) hours as needed for migraine. May repeat in 2 hours if headache persists or recurs.    . temazepam (RESTORIL) 30 MG capsule Take 1 capsule (30 mg total) by mouth at bedtime as needed for sleep. 30 capsule 2  . traMADol (ULTRAM-ER) 300 MG 24 hr tablet Take 300 mg by mouth daily.     No current facility-administered medications for this visit.     Musculoskeletal: Strength & Muscle Tone: within normal limits Gait & Station: normal Patient leans: N/A  Psychiatric Specialty Exam: Review of Systems  Musculoskeletal: Positive for arthralgias, back pain and joint swelling.  All other systems reviewed and are negative.   There were no vitals taken for this visit.There is no height or weight on file to calculate BMI.  General Appearance: NA  Eye Contact:  NA  Speech:  Clear and Coherent  Volume:  Normal  Mood:  Euthymic  Affect:  NA  Thought Process:  Goal Directed  Orientation:  Full (Time, Place, and Person)  Thought Content: Rumination   Suicidal Thoughts:  No  Homicidal Thoughts:  No  Memory:  Immediate;   Good Recent;   Good Remote;   Good  Judgement:  Good  Insight:  Fair  Psychomotor Activity:  Decreased  Concentration:  Concentration: Good and Attention Span: Good  Recall:  Good  Fund of Knowledge:  Fair  Language: Good  Akathisia:  No  Handed:  Right  AIMS (if indicated): not done  Assets:  Communication Skills Desire for Improvement Physical Health Resilience Social Support Talents/Skills  ADL's:  Intact  Cognition: WNL  Sleep:  Fair   Screenings: GAD-7     Counselor from 11/10/2017 [facility]  Total GAD-7 Score 19       Assessment and Plan: Patient is a 54 year old female with a history of depression and anxiety.  Despite the recent increase in pain level she is doing well in terms of mood.  She will continue Xanax 3 times daily for anxiety, Cymbalta 60 mg twice daily for depression, Abilify 2 mg daily for augmentation and temazepam 30 mg at bedtime for sleep.  She will return to see me in 3 months   Diannia Ruder, MD 12/29/2019, 10:44 AM

## 2020-01-10 ENCOUNTER — Telehealth (HOSPITAL_COMMUNITY): Payer: Self-pay | Admitting: Psychiatry

## 2020-01-10 ENCOUNTER — Ambulatory Visit (HOSPITAL_COMMUNITY): Payer: Self-pay | Admitting: Psychiatry

## 2020-01-10 ENCOUNTER — Other Ambulatory Visit: Payer: Self-pay

## 2020-01-10 NOTE — Telephone Encounter (Signed)
Therapist called patient for scheduled appointment.  However, patient is sick and is unable to complete appointment.  Therapist and patient agreed to reschedule.

## 2020-01-12 DIAGNOSIS — M797 Fibromyalgia: Secondary | ICD-10-CM | POA: Diagnosis not present

## 2020-01-12 DIAGNOSIS — Z7189 Other specified counseling: Secondary | ICD-10-CM | POA: Diagnosis not present

## 2020-01-12 DIAGNOSIS — Z6841 Body Mass Index (BMI) 40.0 and over, adult: Secondary | ICD-10-CM | POA: Diagnosis not present

## 2020-01-12 DIAGNOSIS — J302 Other seasonal allergic rhinitis: Secondary | ICD-10-CM | POA: Diagnosis not present

## 2020-02-21 DIAGNOSIS — G4733 Obstructive sleep apnea (adult) (pediatric): Secondary | ICD-10-CM | POA: Diagnosis not present

## 2020-02-23 ENCOUNTER — Other Ambulatory Visit: Payer: Self-pay

## 2020-02-23 ENCOUNTER — Ambulatory Visit (INDEPENDENT_AMBULATORY_CARE_PROVIDER_SITE_OTHER): Payer: BC Managed Care – PPO | Admitting: Psychiatry

## 2020-02-23 DIAGNOSIS — F411 Generalized anxiety disorder: Secondary | ICD-10-CM | POA: Diagnosis not present

## 2020-02-23 NOTE — Progress Notes (Addendum)
Virtual Visit via Telephone Note  Patient: Home Provider: [facility] Outpatient [place] office    I connected with Navjot Pilgrim Slane on 02/23/20 at 10:12 AM  by telephone and verified that I am speaking with the correct person using two identifiers.  I discussed the limitations, risks, security and privacy concerns of performing an evaluation and management service by telephone and the availability of in person appointments. I also discussed with the patient that there may be a patient responsible charge related to this service. The patient expressed understanding and agreed to proceed.  I provided 27 minutes of non-face-to-face time during this encounter.   Virginia Salvage, LCSW                               THERAPIST PROGRESS NOTE   Location:  Patient- Home/ Provider - [facility] Outpatient [place] office   Session Time: Thursday 02/23/2020 10:12 AM - 10:39 AM   Participation Level: Active  Behavioral Response: talkative  Type of Therapy: Individual Therapy  Treatment Goals addressed: Learn and implement calming skills to manage and reduce overall anxiety so that it does not interfere with daily functioning,  Identify, challenge, and replace biased, fearful self talk with positive, realistic, and empowering self talk  Interventions: CBT and Supportive  Summary: Virginia Trujillo is a 54 y.o. female who presents with symptoms of anxiety that have been lilfelong per patient's report but symptoms worsened when her mother died 15+ years ago. Also during the same 2 month period around her mother's death,her grandparents and her sister died. Symptoms include isolative behaviors, panic attacks, nervousness, sleep difficulty, memory difficulty, persistent worry, daily crying spells, depressed mood.                Patient last's contact was by virtual visit about 50months ago.  She is experiencing minimal symptoms of depression and anxiety.  She reports multiple stressors but continuing to manage  well  using deep breathing, support from her family, her faith, self talk, behavioral activation, and reviewing handouts from previous sessions as coping tools.  She reports her brother experienced increased behavioral health issues a few weeks ago and she was involved in facilitating care for her brother.  She reports managing this well.  She also reports some stress related to the recent death of 3 members of her church family dying.  She expresses appropriate sadness.  She is pleased her son has returned from rehab program, is working, and is doing well.  She does express concern about his upcoming court date the beginning of November but is feeling better about this as she expresses confidence in his attorney.  Patient reports she has been very busy in packing as she hopes to move into her new home the beginning of next year.  Patient reports she has misplaced mental health maintenance plan.    Suicidal/Homicidal: None  Therapist Response: reviewed symptoms, discussed stressors, facilitated expression of thoughts and feelings, validated feelings and normalized feelings of sadness related to grief and loss issues, praised and reinforced patient's continued behavioral activation/socialization with family and friends/and use of helpful coping strategies, discussed patient's progress in treatment, processed patient's feelings about upcoming termination,  will send patient handout (mental health maintenance plan) in preparation for next session     Plan: Return again in 2 weeks  Diagnosis: Axis I: Generalized Anxiety Disorder    Axis II: No diagnosis    Virginia Salvage, LCSW 02/23/2020

## 2020-03-08 ENCOUNTER — Other Ambulatory Visit: Payer: Self-pay

## 2020-03-08 ENCOUNTER — Ambulatory Visit (INDEPENDENT_AMBULATORY_CARE_PROVIDER_SITE_OTHER): Payer: BC Managed Care – PPO | Admitting: Psychiatry

## 2020-03-08 DIAGNOSIS — F411 Generalized anxiety disorder: Secondary | ICD-10-CM

## 2020-03-08 NOTE — Progress Notes (Signed)
Virtual Visit via Video Note  I connected with Virginia Trujillo on 03/08/20 at 10:12 AM EDT  by a video enabled telemedicine application and verified that I am speaking with the correct person using two identifiers.  Location: Patient: Home Provider: [facility] [place] offoce    I discussed the limitations of evaluation and management by telemedicine and the availability of in person appointments. The patient expressed understanding and agreed to proceed.  I provided 42 minutes of non-face-to-face time during this encounter.   Adah Salvage, LCSW                                THERAPIST PROGRESS NOTE   Location:  Patient- Home/ Provider - [facility] Outpatient [place] office   Session Time: Thursday 03/07/2020 10:12 AM -  10:54 AM   Participation Level: Active  Behavioral Response: talkative  Type of Therapy: Individual Therapy  Treatment Goals addressed: Learn and implement calming skills to manage and reduce overall anxiety so that it does not interfere with daily functioning,  Identify, challenge, and replace biased, fearful self talk with positive, realistic, and empowering self talk  Interventions: CBT and Supportive  Summary: Virginia Trujillo is a 54 y.o. female who presents with symptoms of anxiety that have been lilfelong per patient's report but symptoms worsened when her mother died 15+ years ago. Also during the same 2 month period around her mother's death,her grandparents and her sister died. Symptoms include isolative behaviors, panic attacks, nervousness, sleep difficulty, memory difficulty, persistent worry, daily crying spells, depressed mood.                Patient last's contact was by virtual visit about 2-3 weeks ago.  She is experiencing minimal symptoms of depression and anxiety.  She reports multiple stressors but continuing to manage well  using deep breathing, support from her family, her faith, self talk, behavioral activation, and reviewing handouts  from previous sessions as coping tools.  She expresses less worry about her son.  Patient is pleased with her progress in treatment and expresses confidence in her ability to successfully use coping skills.    Suicidal/Homicidal: None  Therapist Response: reviewed symptoms, discussed stressors, facilitated expression of thoughts and feelings, validated feelings, praised and reinforced patient's use of helpful coping strategies, assisted patient develop mental health maintenance plan, did termination, encouraged patient to contact this practice should she need psychotherapy services in the future.  She will continue to see psychiatrist Dr. Tenny Craw for medication management.   Plan: Return again in 2 weeks  Diagnosis: Axis I: Generalized Anxiety Disorder      Axis II: No diagnosis    Adah Salvage, LCSW 03/08/2020   Outpatient Therapist Discharge Summary  Virginia Trujillo    05-23-1965   Admission Date: 02/04/2016 Discharge Date:  03/08/2020  Reason for Discharge:  Goals accomplished Diagnosis:  Axis I:  Generalized anxiety disorder   Comments: Patient will continue to see psychiatrist Dr. Tenny Craw for medication management.  She is encouraged to contact this practice should she need psychotherapy services in the future.  [name] LCSW

## 2020-03-27 ENCOUNTER — Encounter (HOSPITAL_COMMUNITY): Payer: Self-pay | Admitting: Psychiatry

## 2020-03-27 ENCOUNTER — Telehealth (INDEPENDENT_AMBULATORY_CARE_PROVIDER_SITE_OTHER): Payer: BC Managed Care – PPO | Admitting: Psychiatry

## 2020-03-27 ENCOUNTER — Other Ambulatory Visit: Payer: Self-pay

## 2020-03-27 DIAGNOSIS — F321 Major depressive disorder, single episode, moderate: Secondary | ICD-10-CM | POA: Diagnosis not present

## 2020-03-27 DIAGNOSIS — F411 Generalized anxiety disorder: Secondary | ICD-10-CM | POA: Diagnosis not present

## 2020-03-27 MED ORDER — TEMAZEPAM 30 MG PO CAPS: 30.0000 mg | ORAL_CAPSULE | Freq: Every evening | ORAL | 2 refills | Status: DC | PRN

## 2020-03-27 MED ORDER — ARIPIPRAZOLE 2 MG PO TABS
2.0000 mg | ORAL_TABLET | ORAL | 2 refills | Status: DC
Start: 2020-03-27 — End: 2020-06-27

## 2020-03-27 MED ORDER — DULOXETINE HCL 60 MG PO CPEP
60.0000 mg | ORAL_CAPSULE | Freq: Two times a day (BID) | ORAL | 2 refills | Status: DC
Start: 2020-03-27 — End: 2020-06-27

## 2020-03-27 MED ORDER — ALPRAZOLAM 1 MG PO TABS: 1.0000 mg | ORAL_TABLET | Freq: Three times a day (TID) | ORAL | 2 refills | Status: DC | PRN

## 2020-03-27 NOTE — Progress Notes (Signed)
Virtual Visit via Telephone Note  I connected with Virginia Trujillo on 03/27/20 at  9:40 AM EST by telephone and verified that I am speaking with the correct person using two identifiers.  Location: Patient: home Provider: home   I discussed the limitations, risks, security and privacy concerns of performing an evaluation and management service by telephone and the availability of in person appointments. I also discussed with the patient that there may be a patient responsible charge related to this service. The patient expressed understanding and agreed to proceed.    I discussed the assessment and treatment plan with the patient. The patient was provided an opportunity to ask questions and all were answered. The patient agreed with the plan and demonstrated an understanding of the instructions.   The patient was advised to call back or seek an in-person evaluation if the symptoms worsen or if the condition fails to improve as anticipated.  I provided 15 minutes of non-face-to-face time during this encounter.   Diannia Ruder, MD  [facility] MD/PA/NP OP Progress Note  03/27/2020 9:53 AM Virginia Trujillo  MRN:  [id number]  Chief Complaint:  Chief Complaint    Depression; Anxiety; Follow-up     HPI: This patient is a 54 year old married black female who lives with her husband and son in [place].  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  The patient returns for follow-up after 3 months.  She states that she is generally doing well.  Her symptoms return from rehab and has found a job and is no longer using drugs.  She states that her mood is good and she denies serious depression anxiety or panic attacks.  She is sleeping fairly well with the temazepam.  She recently bruised her toe and sometimes this keeps her awake at night but she is taking tramadol which helps. Visit Diagnosis:    ICD-10-CM   1. Generalized anxiety disorder  F41.1   2. Current moderate episode of major  depressive disorder without prior episode (HCC)  F32.1     Past Psychiatric History: none  Past Medical History:  Past Medical History:  Diagnosis Date  . Acid reflux   . Anxiety   . Depression   . Fibromyalgia     Past Surgical History:  Procedure Laterality Date  . CESAREAN SECTION    . MOUTH SURGERY    . tubes tied      Family Psychiatric History: see below  Family History:  Family History  Problem Relation Age of Onset  . Hypertension Father   . Hypertension Brother   . Diabetes Brother   . Depression Brother   . Anxiety disorder Brother   . Stroke Maternal Grandmother   . Stroke Paternal Grandfather   . Congestive Heart Failure Sister   . Diabetes Sister   . Hypertension Sister   . Anxiety disorder Sister   . Depression Sister   . Heart disease Unknown   . Arthritis Unknown   . Cancer Unknown   . Asthma Unknown   . Diabetes Unknown   . Kidney disease Unknown     Social History:  Social History   Socioeconomic History  . Marital status: Married    Spouse name: Not on file  . Number of children: 2  . Years of education: 91  . Highest education level: Not on file  Occupational History  . Occupation: none    Employer: UNEMPLOYED  . Occupation: Unemployed   Tobacco Use  .  Smoking status: Never Smoker  . Smokeless tobacco: Never Used  Substance and Sexual Activity  . Alcohol use: No  . Drug use: No  . Sexual activity: Yes    Birth control/protection: Surgical  Other Topics Concern  . Not on file  Social History Narrative   Patient lives at home with husband and son.    Patient does not work   Patient has a high school education    Patient has 2 children.   Social Determinants of Health   Financial Resource Strain:   . Difficulty of Paying Living Expenses: Not on file  Food Insecurity:   . Worried About Programme researcher, broadcasting/film/video in the Last Year: Not on file  . Ran Out of Food in the Last Year: Not on file  Transportation Needs:   . Lack of  Transportation (Medical): Not on file  . Lack of Transportation (Non-Medical): Not on file  Physical Activity:   . Days of Exercise per Week: Not on file  . Minutes of Exercise per Session: Not on file  Stress:   . Feeling of Stress : Not on file  Social Connections:   . Frequency of Communication with Friends and Family: Not on file  . Frequency of Social Gatherings with Friends and Family: Not on file  . Attends Religious Services: Not on file  . Active Member of Clubs or Organizations: Not on file  . Attends Banker Meetings: Not on file  . Marital Status: Not on file    Allergies:  Allergies  Allergen Reactions  . Prednisone Other (See Comments)    Depression and crying  . Ambien [Zolpidem Tartrate] Other (See Comments)    hallucinations  . Penicillins     Metabolic Disorder Labs: No results found for: HGBA1C, MPG No results found for: PROLACTIN No results found for: CHOL, TRIG, HDL, CHOLHDL, VLDL, LDLCALC Lab Results  Component Value Date   TSH 3.190 11/29/2013    Therapeutic Level Labs: No results found for: LITHIUM No results found for: VALPROATE No components found for:  CBMZ  Current Medications: Current Outpatient Medications  Medication Sig Dispense Refill  . Acetaminophen (TYLENOL 8 HOUR PO) Take by mouth.      Marland Kitchen albuterol (PROVENTIL) (2.5 MG/3ML) 0.083% nebulizer solution Take 2.5 mg by nebulization every 6 (six) hours as needed for wheezing or shortness of breath.    . ALPRAZolam (XANAX) 1 MG tablet Take 1 tablet (1 mg total) by mouth 3 (three) times daily as needed for anxiety. 90 tablet 2  . ARIPiprazole (ABILIFY) 2 MG tablet Take 1 tablet (2 mg total) by mouth every morning. 30 tablet 2  . baclofen (LIORESAL) 10 MG tablet Take 10 mg by mouth 3 (three) times daily.    . Cholecalciferol (VITAMIN D PO) Take by mouth.    . doxycycline (VIBRAMYCIN) 100 MG capsule Take 100 mg by mouth 2 (two) times daily.    . DULoxetine (CYMBALTA) 60 MG  capsule Take 1 capsule (60 mg total) by mouth 2 (two) times daily. 60 capsule 2  . gabapentin (NEURONTIN) 600 MG tablet daily  1  . meloxicam (MOBIC) 15 MG tablet Take 15 mg by mouth daily.      . montelukast (SINGULAIR) 10 MG tablet Take 10 mg by mouth at bedtime.    Marland Kitchen omeprazole (PRILOSEC) 40 MG capsule Take 1 capsule (40 mg total) by mouth daily. Appointment needed for further refills (Patient not taking: Reported on 06/07/2019) 30 capsule 3  .  SUMAtriptan (IMITREX) 25 MG tablet Take 25 mg by mouth every 2 (two) hours as needed for migraine. May repeat in 2 hours if headache persists or recurs.    . temazepam (RESTORIL) 30 MG capsule Take 1 capsule (30 mg total) by mouth at bedtime as needed for sleep. 30 capsule 2  . traMADol (ULTRAM-ER) 300 MG 24 hr tablet Take 300 mg by mouth daily.     No current facility-administered medications for this visit.     Musculoskeletal: Strength & Muscle Tone: within normal limits Gait & Station: normal Patient leans: N/A  Psychiatric Specialty Exam: Review of Systems  Musculoskeletal: Positive for arthralgias and back pain.  All other systems reviewed and are negative.   There were no vitals taken for this visit.There is no height or weight on file to calculate BMI.  General Appearance: NA  Eye Contact:  NA  Speech:  Clear and Coherent  Volume:  Normal  Mood:  Euthymic  Affect:  NA  Thought Process:  Goal Directed  Orientation:  Full (Time, Place, and Person)  Thought Content: WDL   Suicidal Thoughts:  No  Homicidal Thoughts:  No  Memory:  Immediate;   Good Recent;   Good Remote;   Fair  Judgement:  Good  Insight:  Fair  Psychomotor Activity:  Normal  Concentration:  Concentration: Good and Attention Span: Good  Recall:  Good  Fund of Knowledge: Good  Language: Good  Akathisia:  No  Handed:  Right  AIMS (if indicated): not done  Assets:  Communication Skills Desire for Improvement Resilience Social Support Talents/Skills  ADL's:   Intact  Cognition: WNL  Sleep:  Fair   Screenings: GAD-7     Counselor from 11/10/2017 [facility]  Total GAD-7 Score 19       Assessment and Plan: This patient is a 54 year old female with a history of depression anxiety.  She continues to do well in terms of mood and.  She will continue Cymbalta 60 mg twice daily for depression Abilify 2 mg daily for augmentation, Xanax 1 mg 3 times daily for anxiety and temazepam 30 mg at bedtime for sleep.  She will return to see me in 3 months   Diannia Ruder, MD 03/27/2020, 9:53 AM

## 2020-04-16 DIAGNOSIS — I10 Essential (primary) hypertension: Secondary | ICD-10-CM | POA: Diagnosis not present

## 2020-04-16 DIAGNOSIS — M25511 Pain in right shoulder: Secondary | ICD-10-CM | POA: Diagnosis not present

## 2020-04-16 DIAGNOSIS — M791 Myalgia, unspecified site: Secondary | ICD-10-CM | POA: Diagnosis not present

## 2020-04-16 DIAGNOSIS — G894 Chronic pain syndrome: Secondary | ICD-10-CM | POA: Diagnosis not present

## 2020-04-16 DIAGNOSIS — E7849 Other hyperlipidemia: Secondary | ICD-10-CM | POA: Diagnosis not present

## 2020-04-16 DIAGNOSIS — Z6841 Body Mass Index (BMI) 40.0 and over, adult: Secondary | ICD-10-CM | POA: Diagnosis not present

## 2020-04-16 DIAGNOSIS — M1991 Primary osteoarthritis, unspecified site: Secondary | ICD-10-CM | POA: Diagnosis not present

## 2020-04-16 DIAGNOSIS — R7309 Other abnormal glucose: Secondary | ICD-10-CM | POA: Diagnosis not present

## 2020-04-16 DIAGNOSIS — M797 Fibromyalgia: Secondary | ICD-10-CM | POA: Diagnosis not present

## 2020-06-08 ENCOUNTER — Other Ambulatory Visit: Payer: Self-pay

## 2020-06-08 ENCOUNTER — Ambulatory Visit (INDEPENDENT_AMBULATORY_CARE_PROVIDER_SITE_OTHER): Payer: BC Managed Care – PPO | Admitting: Psychiatry

## 2020-06-08 DIAGNOSIS — F411 Generalized anxiety disorder: Secondary | ICD-10-CM | POA: Diagnosis not present

## 2020-06-08 NOTE — Progress Notes (Signed)
Virtual Visit via Telephone Note  I connected with Rane Dumm Nilson on 06/08/20 at 11:18  AM EST  by telephone and verified that I am speaking with the correct person using two identifiers.  Location: Patient: Home Provider: [facility] Outpatient [place] office    I discussed the limitations, risks, security and privacy concerns of performing an evaluation and management service by telephone and the availability of in person appointments. I also discussed with the patient that there may be a patient responsible charge related to this service. The patient expressed understanding and agreed to proceed.    I provided 32 minutes of non-face-to-face time during this encounter.   Adah Salvage, LCSW                                THERAPIST PROGRESS NOTE   Location:  Patient- Home/ Provider - [facility] Outpatient [place] office   Session Time: Friday 06/08/2020 11:18 AM - 11:50 AM   Participation Level: Active  Behavioral Response: talkative  Type of Therapy: Individual Therapy  Treatment Goals addressed: Learn and implement calming skills to manage and reduce overall anxiety so that it does not interfere with daily functioning,  Identify, challenge, and replace biased, fearful self talk with positive, realistic, and empowering self talk  Interventions: CBT and Supportive  Summary: MARNESHA GAGEN is a 55 y.o. female who presents with symptoms of anxiety that have been lilfelong per patient's report but symptoms worsened when her mother died 15+ years ago. Also during the same 2 month period around her mother's death,her grandparents and her sister died. Symptoms include isolative behaviors, panic attacks, nervousness, sleep difficulty, memory difficulty, persistent worry, daily crying spells, depressed mood.                Patient last's contact was by virtual visit about 3 months ago.  She is seen emergently today as she is experiencing increased stress and anxiety regarding her brothers  behavioral health issues.  He has a pattern of hallucinations and violent behavior.  She expresses frustration as he has been in and out of the hospital in [place] but has not received the treatment patient thinks he needs.  She fears he will harm himself and/or her father.  He currently is in [facility] and plans are for him to be transferred to [facility].  Patient reports worry, nervousness, and crying spells.    Suicidal/Homicidal: None  Therapist Response: reviewed symptoms, discussed stressors, facilitated expression of thoughts and feelings, validated feelings, assisted patient with problem solving regarding expressing her concerns to the appropriate staff at [facility], reviewed the fight flight response and discussed rationale for practicing deep breathing to trigger relaxation response, developed plan with patient to practice deep breathing daily, also developed plan with patient to review mental health maintenance plan, discussed ways to use her support system,   Plan: Return again in 2 weeks  Diagnosis: Axis I: Generalized Anxiety Disorder      Axis II: No diagnosis    Adah Salvage, LCSW 06/08/2020

## 2020-06-12 DIAGNOSIS — Z23 Encounter for immunization: Secondary | ICD-10-CM | POA: Diagnosis not present

## 2020-06-20 DIAGNOSIS — L918 Other hypertrophic disorders of the skin: Secondary | ICD-10-CM | POA: Diagnosis not present

## 2020-06-20 DIAGNOSIS — T50Z95A Adverse effect of other vaccines and biological substances, initial encounter: Secondary | ICD-10-CM | POA: Diagnosis not present

## 2020-06-20 DIAGNOSIS — Z6841 Body Mass Index (BMI) 40.0 and over, adult: Secondary | ICD-10-CM | POA: Diagnosis not present

## 2020-06-27 ENCOUNTER — Encounter (HOSPITAL_COMMUNITY): Payer: Self-pay | Admitting: Psychiatry

## 2020-06-27 ENCOUNTER — Other Ambulatory Visit: Payer: Self-pay

## 2020-06-27 ENCOUNTER — Telehealth (INDEPENDENT_AMBULATORY_CARE_PROVIDER_SITE_OTHER): Payer: BC Managed Care – PPO | Admitting: Psychiatry

## 2020-06-27 DIAGNOSIS — F411 Generalized anxiety disorder: Secondary | ICD-10-CM

## 2020-06-27 DIAGNOSIS — F321 Major depressive disorder, single episode, moderate: Secondary | ICD-10-CM

## 2020-06-27 MED ORDER — ARIPIPRAZOLE 2 MG PO TABS
2.0000 mg | ORAL_TABLET | ORAL | 2 refills | Status: DC
Start: 2020-06-27 — End: 2020-09-24

## 2020-06-27 MED ORDER — ALPRAZOLAM 1 MG PO TABS
1.0000 mg | ORAL_TABLET | Freq: Three times a day (TID) | ORAL | 2 refills | Status: DC | PRN
Start: 2020-06-27 — End: 2020-09-24

## 2020-06-27 MED ORDER — TEMAZEPAM 30 MG PO CAPS: 30.0000 mg | ORAL_CAPSULE | Freq: Every evening | ORAL | 2 refills | Status: DC | PRN

## 2020-06-27 MED ORDER — DULOXETINE HCL 60 MG PO CPEP
60.0000 mg | ORAL_CAPSULE | Freq: Two times a day (BID) | ORAL | 2 refills | Status: DC
Start: 2020-06-27 — End: 2020-09-24

## 2020-06-27 NOTE — Progress Notes (Signed)
Virtual Visit via Video Note  I connected with Virginia Trujillo on 06/27/20 at 10:20 AM EST by a video enabled telemedicine application and verified that I am speaking with the correct person using two identifiers.  Location: Patient: home Provider: home   I discussed the limitations of evaluation and management by telemedicine and the availability of in person appointments. The patient expressed understanding and agreed to proceed.    I discussed the assessment and treatment plan with the patient. The patient was provided an opportunity to ask questions and all were answered. The patient agreed with the plan and demonstrated an understanding of the instructions.   The patient was advised to call back or seek an in-person evaluation if the symptoms worsen or if the condition fails to improve as anticipated.  I provided 15 minutes of non-face-to-face time during this encounter.   Diannia Ruder, MD  [facility] MD/PA/NP OP Progress Note  06/27/2020 10:36 AM Virginia Trujillo  MRN:  [id number]  Chief Complaint:  Chief Complaint    Anxiety; Depression; Follow-up     HPI: This patient is a 55 year old married black female who lives with her husband and son in [place]. 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 Patient returns for follow-up after 3 months.  She states that she has been through a lot of stress recently.  Her 46 year old nephew got shot outside a club in [place] a few weeks ago and was killed.  This has been very difficult for the whole family.  She also lost her sister-in-law to cancer shortly after that.  On the positive side her son who has had substance abuse problems is doing very well.  She and her family have bought a new house and they are going to be moving quite soon.  She is very excited about it.  For a while she went through a period of feeling very anxious and having trouble sleeping but this is settled down.  She states right now her mood is good and she  denies severe anxiety depressive symptoms or suicidal ideation.  She is back to sleeping well and does not need to use the temazepam except  occasionally Visit Diagnosis:    ICD-10-CM   1. Generalized anxiety disorder  F41.1   2. 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 episode (HCC)  F32.1     Past Psychiatric History: none  Past Medical History:  Past Medical History:  Diagnosis Date  . Acid reflux   . Anxiety   . Depression   . Fibromyalgia     Past Surgical History:  Procedure Laterality Date  . CESAREAN SECTION    . MOUTH SURGERY    . tubes tied      Family Psychiatric History: see below  Family History:  Family History  Problem Relation Age of Onset  . Hypertension Father   . Hypertension Brother   . Diabetes Brother   . Depression Brother   . Anxiety disorder Brother   . Stroke Maternal Grandmother   . Stroke Paternal Grandfather   . Congestive Heart Failure Sister   . Diabetes Sister   . Hypertension Sister   . Anxiety disorder Sister   . Depression Sister   . Heart disease Unknown   . Arthritis Unknown   . Cancer Unknown   . Asthma Unknown   . Diabetes Unknown   . Kidney disease Unknown     Social History:  Social History   Socioeconomic History  .  Marital status: Married    Spouse name: Not on file  . Number of children: 2  . Years of education: 38  . Highest education level: Not on file  Occupational History  . Occupation: none    Employer: UNEMPLOYED  . Occupation: Unemployed   Tobacco Use  . Smoking status: Never Smoker  . Smokeless tobacco: Never Used  Substance and Sexual Activity  . Alcohol use: No  . Drug use: No  . Sexual activity: Yes    Birth control/protection: Surgical  Other Topics Concern  . Not on file  Social History Narrative   Patient lives at home with husband and son.    Patient does not work   Patient has a high school education    Patient has 2 children.   Social Determinants of  Health   Financial Resource Strain: Not on file  Food Insecurity: Not on file  Transportation Needs: Not on file  Physical Activity: Not on file  Stress: Not on file  Social Connections: Not on file    Allergies:  Allergies  Allergen Reactions  . Prednisone Other (See Comments)    Depression and crying  . Ambien [Zolpidem Tartrate] Other (See Comments)    hallucinations  . Penicillins     Metabolic Disorder Labs: No results found for: HGBA1C, MPG No results found for: PROLACTIN No results found for: CHOL, TRIG, HDL, CHOLHDL, VLDL, LDLCALC Lab Results  Component Value Date   TSH 3.190 11/29/2013    Therapeutic Level Labs: No results found for: LITHIUM No results found for: VALPROATE No components found for:  CBMZ  Current Medications: Current Outpatient Medications  Medication Sig Dispense Refill  . Acetaminophen (TYLENOL 8 HOUR PO) Take by mouth.      Marland Kitchen albuterol (PROVENTIL) (2.5 MG/3ML) 0.083% nebulizer solution Take 2.5 mg by nebulization every 6 (six) hours as needed for wheezing or shortness of breath.    . ALPRAZolam (XANAX) 1 MG tablet Take 1 tablet (1 mg total) by mouth 3 (three) times daily as needed for anxiety. 90 tablet 2  . ARIPiprazole (ABILIFY) 2 MG tablet Take 1 tablet (2 mg total) by mouth every morning. 30 tablet 2  . baclofen (LIORESAL) 10 MG tablet Take 10 mg by mouth 3 (three) times daily.    . Cholecalciferol (VITAMIN D PO) Take by mouth.    . doxycycline (VIBRAMYCIN) 100 MG capsule Take 100 mg by mouth 2 (two) times daily.    . DULoxetine (CYMBALTA) 60 MG capsule Take 1 capsule (60 mg total) by mouth 2 (two) times daily. 60 capsule 2  . gabapentin (NEURONTIN) 600 MG tablet daily  1  . meloxicam (MOBIC) 15 MG tablet Take 15 mg by mouth daily.      . montelukast (SINGULAIR) 10 MG tablet Take 10 mg by mouth at bedtime.    Marland Kitchen omeprazole (PRILOSEC) 40 MG capsule Take 1 capsule (40 mg total) by mouth daily. Appointment needed for further refills (Patient  not taking: Reported on 06/07/2019) 30 capsule 3  . SUMAtriptan (IMITREX) 25 MG tablet Take 25 mg by mouth every 2 (two) hours as needed for migraine. May repeat in 2 hours if headache persists or recurs.    . temazepam (RESTORIL) 30 MG capsule Take 1 capsule (30 mg total) by mouth at bedtime as needed for sleep. 30 capsule 2  . traMADol (ULTRAM-ER) 300 MG 24 hr tablet Take 300 mg by mouth daily.     No current facility-administered medications for this visit.  Musculoskeletal: Strength & Muscle Tone: within normal limits Gait & Station: normal Patient leans: N/A  Psychiatric Specialty Exam: Review of Systems  Musculoskeletal: Positive for arthralgias and myalgias.  All other systems reviewed and are negative.   There were no vitals taken for this visit.There is no height or weight on file to calculate BMI.  General Appearance: NA  Eye Contact:  NA  Speech:  Clear and Coherent  Volume:  Normal  Mood:  Euthymic  Affect:  NA  Thought Process:  Goal Directed  Orientation:  Full (Time, Place, and Person)  Thought Content: WDL   Suicidal Thoughts:  No  Homicidal Thoughts:  No  Memory:  Immediate;   Good Recent;   Good Remote;   Fair  Judgement:  Good  Insight:  Good  Psychomotor Activity:  Decreased  Concentration:  Concentration: Good and Attention Span: Good  Recall:  Good  Fund of Knowledge: Good  Language: Good  Akathisia:  No  Handed:  Right  AIMS (if indicated): not done  Assets:  Communication Skills Desire for Improvement Resilience Social Support Talents/Skills  ADL's:  Intact  Cognition: WNL  Sleep:  Good   Screenings: GAD-7   Flowsheet Row Counselor from 11/10/2017 in [facility]  Total GAD-7 Score 19    PHQ2-9   Flowsheet Row Video Visit from 06/27/2020 in [facility]  PHQ-2 Total Score 2  PHQ-9 Total Score 6       Assessment and Plan: This patient is a  55 year old female with a history of depression and anxiety.  Despite recent stressors she has been doing well in terms of her mood.  She will continue Cymbalta 60 mg twice daily for depression, Abilify 2 mg daily for augmentation Xanax 1 mg 3 times daily for anxiety and temazepam 30 mg at bedtime as needed for sleep.  She will return to see me in 3 months   Diannia Ruder, MD 06/27/2020, 10:36 AM

## 2020-07-06 ENCOUNTER — Other Ambulatory Visit (HOSPITAL_COMMUNITY): Payer: Self-pay | Admitting: Psychiatry

## 2020-07-27 ENCOUNTER — Other Ambulatory Visit: Payer: Self-pay

## 2020-07-27 ENCOUNTER — Telehealth (HOSPITAL_COMMUNITY): Payer: Self-pay | Admitting: Psychiatry

## 2020-07-27 ENCOUNTER — Ambulatory Visit (HOSPITAL_COMMUNITY): Payer: BC Managed Care – PPO | Admitting: Psychiatry

## 2020-07-27 NOTE — Telephone Encounter (Signed)
Therapist called patient for scheduled appointment.  However, patient is sick and unable to have session.  Therapist and patient agreed to reschedule appointment.

## 2020-08-10 ENCOUNTER — Other Ambulatory Visit: Payer: Self-pay

## 2020-08-10 ENCOUNTER — Ambulatory Visit (HOSPITAL_COMMUNITY): Payer: BC Managed Care – PPO | Admitting: Psychiatry

## 2020-08-13 ENCOUNTER — Other Ambulatory Visit: Payer: Self-pay

## 2020-08-13 ENCOUNTER — Ambulatory Visit (INDEPENDENT_AMBULATORY_CARE_PROVIDER_SITE_OTHER): Payer: BC Managed Care – PPO | Admitting: Psychiatry

## 2020-08-13 DIAGNOSIS — F321 Major depressive disorder, single episode, moderate: Secondary | ICD-10-CM

## 2020-08-13 DIAGNOSIS — F411 Generalized anxiety disorder: Secondary | ICD-10-CM

## 2020-08-13 NOTE — Progress Notes (Signed)
Virtual Visit via Telephone Note  I connected with Virginia Trujillo on 08/13/20 at 8:38 AM  by telephone and verified that I am speaking with the correct person using two identifiers.  Location: Patient: Home Provider: [facility] Outpatient [place] office    I discussed the limitations, risks, security and privacy concerns of performing an evaluation and management service by telephone and the availability of in person appointments. I also discussed with the patient that there may be a patient responsible charge related to this service. The patient expressed understanding and agreed to proceed.  I provided 26 minutes of non-face-to-face time during this encounter.   Adah Salvage, LCSW                               THERAPIST PROGRESS NOTE     Session Time: Monday 08/13/2020 8:38 AM -  9:04 AM   Participation Level: Active  Behavioral Response: talkative  Type of Therapy: Individual Therapy  Treatment Goals addressed: Learn and implement calming skills to manage and reduce overall anxiety so that it does not interfere with daily functioning,  Identify, challenge, and replace biased, fearful self talk with positive, realistic, and empowering self talk  Interventions: CBT and Supportive  Summary: Virginia Trujillo is a 55 y.o. female who presents with symptoms of anxiety that have been lilfelong per patient's report but symptoms worsened when her mother died 15+ years ago. Also during the same 2 month period around her mother's death,her grandparents and her sister died. Symptoms include isolative behaviors, panic attacks, nervousness, sleep difficulty, memory difficulty, persistent worry, daily crying spells, depressed mood.                Patient last's contact was by virtual visit about 2 months ago.  She reports doing very well since last session.  She has experienced minimal symptoms of anxiety.  She has been using helpful coping strategies and reports reviewing her mental health  maintenance plan weekly.  She expresses less worry about her brother and her children as they are all doing very well now.  Recently moved into her new home which is about 10 minutes away from her brother's home.  She helps him to manage his medications and reports his behavior is much improved.  Patient is excited about her new home and has been busy with unpacking and decorating.  She also has been involved in various social activities with her family and is looking forward to going on a cruise later this month.  Patient is pleased with her use of coping skills and expresses confidence in her ability to recognize early warning signs of return of behavioral health issues as well as ways to intervene.    Suicidal/Homicidal: None  Therapist Response: reviewed symptoms, praised and reinforced patient's use of helpful coping strategies, discussed lapse versus relapse of behavioral health issues, encouraged patient to continue reviewing mental health maintenance plan weekly, agreed to discontinue therapy as patient is doing well and expresses confidence in her ability to use helpful coping strategies, patient will continue to see psychiatrist Dr. Tenny Craw for medication management, patient is encouraged to contact this practice should she need therapy services in the future   Plan: Return again in 2 weeks  Diagnosis: Axis I: Generalized Anxiety Disorder      Axis II: No diagnosis    Adah Salvage, LCSW 08/13/2020

## 2020-08-24 ENCOUNTER — Ambulatory Visit (HOSPITAL_COMMUNITY): Payer: BC Managed Care – PPO | Admitting: Psychiatry

## 2020-09-24 ENCOUNTER — Other Ambulatory Visit: Payer: Self-pay

## 2020-09-24 ENCOUNTER — Other Ambulatory Visit (HOSPITAL_COMMUNITY): Payer: Self-pay | Admitting: Psychiatry

## 2020-09-24 ENCOUNTER — Telehealth (INDEPENDENT_AMBULATORY_CARE_PROVIDER_SITE_OTHER): Payer: BC Managed Care – PPO | Admitting: Psychiatry

## 2020-09-24 ENCOUNTER — Encounter (HOSPITAL_COMMUNITY): Payer: Self-pay | Admitting: Psychiatry

## 2020-09-24 DIAGNOSIS — F411 Generalized anxiety disorder: Secondary | ICD-10-CM

## 2020-09-24 DIAGNOSIS — F321 Major depressive disorder, single episode, moderate: Secondary | ICD-10-CM

## 2020-09-24 MED ORDER — ALPRAZOLAM 1 MG PO TABS: 1.0000 mg | ORAL_TABLET | Freq: Three times a day (TID) | ORAL | 2 refills | Status: DC | PRN

## 2020-09-24 MED ORDER — ARIPIPRAZOLE 2 MG PO TABS: 2.0000 mg | ORAL_TABLET | ORAL | 2 refills | Status: DC

## 2020-09-24 MED ORDER — DULOXETINE HCL 60 MG PO CPEP: 60.0000 mg | ORAL_CAPSULE | Freq: Two times a day (BID) | ORAL | 2 refills | Status: DC

## 2020-09-24 MED ORDER — TEMAZEPAM 30 MG PO CAPS: 30.0000 mg | ORAL_CAPSULE | Freq: Every evening | ORAL | 2 refills | Status: DC | PRN

## 2020-09-24 NOTE — Progress Notes (Signed)
Virtual Visit via Telephone Note  I connected with Virginia Trujillo on 09/24/20 at  9:40 AM EDT by telephone and verified that I am speaking with the correct person using two identifiers.  Location: Patient: home Provider: home office   I discussed the limitations, risks, security and privacy concerns of performing an evaluation and management service by telephone and the availability of in person appointments. I also discussed with the patient that there may be a patient responsible charge related to this service. The patient expressed understanding and agreed to proceed.   History of Present Illness:     I discussed the assessment and treatment plan with the patient. The patient was provided an opportunity to ask questions and all were answered. The patient agreed with the plan and demonstrated an understanding of the instructions.   The patient was advised to call back or seek an in-person evaluation if the symptoms worsen or if the condition fails to improve as anticipated.  I provided 15 minutes of non-face-to-face time during this encounter.   Diannia Ruder, MD  [facility] MD/PA/NP OP Progress Note  09/24/2020 9:56 AM Virginia Trujillo  MRN:  [id number]  Chief Complaint:  Chief Complaint    Anxiety; Depression; Follow-up     HPI: This patient is a 55 year old married black female who lives with her husband and son in [place]. 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  She states that she is having bad problems with her allergies and bronchitis right now which is getting her down to some degree.  She is getting an inhaler from her physician.  In general however her mood is generally been stable and she denies serious depression or anxiety.  Sometimes she feels sad when she misses family members who have passed away.  She is generally is trying to stay busy.  She is very positive  about her son who is now off drugs has a girlfriend and the girlfriend is expecting.  The patient is very excited about the new grandchild. Visit Diagnosis:    ICD-10-CM   1. Generalized anxiety disorder  F41.1   2. 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 episode (HCC)  F32.1     Past Psychiatric History: none  Past Medical History:  Past Medical History:  Diagnosis Date  . Acid reflux   . Anxiety   . Depression   . Fibromyalgia     Past Surgical History:  Procedure Laterality Date  . CESAREAN SECTION    . MOUTH SURGERY    . tubes tied      Family Psychiatric History: see below  Family History:  Family History  Problem Relation Age of Onset  . Hypertension Father   . Hypertension Brother   . Diabetes Brother   . Depression Brother   . Anxiety disorder Brother   . Stroke Maternal Grandmother   . Stroke Paternal Grandfather   . Congestive Heart Failure Sister   . Diabetes Sister   . Hypertension Sister   . Anxiety disorder Sister   . Depression Sister   . Heart disease Unknown   . Arthritis Unknown   . Cancer Unknown   . Asthma Unknown   . Diabetes Unknown   . Kidney disease Unknown     Social History:  Social History   Socioeconomic History  . Marital status: Married    Spouse name: Not on  file  . Number of children: 2  . Years of education: 28  . Highest education level: Not on file  Occupational History  . Occupation: none    Employer: UNEMPLOYED  . Occupation: Unemployed   Tobacco Use  . Smoking status: Never Smoker  . Smokeless tobacco: Never Used  Substance and Sexual Activity  . Alcohol use: No  . Drug use: No  . Sexual activity: Yes    Birth control/protection: Surgical  Other Topics Concern  . Not on file  Social History Narrative   Patient lives at home with husband and son.    Patient does not work   Patient has a high school education    Patient has 2 children.   Social Determinants of Health    Financial Resource Strain: Not on file  Food Insecurity: Not on file  Transportation Needs: Not on file  Physical Activity: Not on file  Stress: Not on file  Social Connections: Not on file    Allergies:  Allergies  Allergen Reactions  . Prednisone Other (See Comments)    Depression and crying  . Ambien [Zolpidem Tartrate] Other (See Comments)    hallucinations  . Penicillins     Metabolic Disorder Labs: No results found for: HGBA1C, MPG No results found for: PROLACTIN No results found for: CHOL, TRIG, HDL, CHOLHDL, VLDL, LDLCALC Lab Results  Component Value Date   TSH 3.190 11/29/2013    Therapeutic Level Labs: No results found for: LITHIUM No results found for: VALPROATE No components found for:  CBMZ  Current Medications: Current Outpatient Medications  Medication Sig Dispense Refill  . Acetaminophen (TYLENOL 8 HOUR PO) Take by mouth.      Marland Kitchen albuterol (PROVENTIL) (2.5 MG/3ML) 0.083% nebulizer solution Take 2.5 mg by nebulization every 6 (six) hours as needed for wheezing or shortness of breath.    . ALPRAZolam (XANAX) 1 MG tablet Take 1 tablet (1 mg total) by mouth 3 (three) times daily as needed for anxiety. 90 tablet 2  . ARIPiprazole (ABILIFY) 2 MG tablet Take 1 tablet (2 mg total) by mouth every morning. 30 tablet 2  . baclofen (LIORESAL) 10 MG tablet Take 10 mg by mouth 3 (three) times daily.    . Cholecalciferol (VITAMIN D PO) Take by mouth.    . doxycycline (VIBRAMYCIN) 100 MG capsule Take 100 mg by mouth 2 (two) times daily.    . DULoxetine (CYMBALTA) 60 MG capsule Take 1 capsule (60 mg total) by mouth 2 (two) times daily. 60 capsule 2  . gabapentin (NEURONTIN) 600 MG tablet daily  1  . meloxicam (MOBIC) 15 MG tablet Take 15 mg by mouth daily.      . montelukast (SINGULAIR) 10 MG tablet Take 10 mg by mouth at bedtime.    Marland Kitchen omeprazole (PRILOSEC) 40 MG capsule Take 1 capsule (40 mg total) by mouth daily. Appointment needed for further refills (Patient not  taking: Reported on 06/07/2019) 30 capsule 3  . SUMAtriptan (IMITREX) 25 MG tablet Take 25 mg by mouth every 2 (two) hours as needed for migraine. May repeat in 2 hours if headache persists or recurs.    . temazepam (RESTORIL) 30 MG capsule Take 1 capsule (30 mg total) by mouth at bedtime as needed for sleep. 30 capsule 2  . traMADol (ULTRAM-ER) 300 MG 24 hr tablet Take 300 mg by mouth daily.     No current facility-administered medications for this visit.     Musculoskeletal: Strength & Muscle Tone: within normal  limits Gait & Station: normal Patient leans: N/A  Psychiatric Specialty Exam: Review of Systems  HENT: Positive for congestion.   Respiratory: Positive for chest tightness.   All other systems reviewed and are negative.   There were no vitals taken for this visit.There is no height or weight on file to calculate BMI.  General Appearance: NA  Eye Contact:  NA  Speech:  Clear and Coherent  Volume:  Normal  Mood:  Euthymic  Affect:  NA  Thought Process:  Goal Directed  Orientation:  Full (Time, Place, and Person)  Thought Content: Rumination   Suicidal Thoughts:  No  Homicidal Thoughts:  No  Memory:  Immediate;   Good Recent;   Good Remote;   Fair  Judgement:  Good  Insight:  Fair  Psychomotor Activity:  Normal  Concentration:  Concentration: Good and Attention Span: Good  Recall:  Good  Fund of Knowledge: Good  Language: Good  Akathisia:  No  Handed:  Right  AIMS (if indicated): not done  Assets:  Communication Skills Desire for Improvement Resilience Social Support Talents/Skills  ADL's:  Intact  Cognition: WNL  Sleep:  Good   Screenings: GAD-7   Flowsheet Row Counselor from 11/10/2017 in [facility]  Total GAD-7 Score 19    PHQ2-9   Flowsheet Row Video Visit from 09/24/2020 in [facility] Video Visit from 06/27/2020 in [facility]  PHQ-2 Total Score 1 2  PHQ-9 Total Score -- 6    Flowsheet Row Video Visit from 09/24/2020 in [facility]  C-SSRS RISK CATEGORY No Risk       Assessment and Plan: This patient is a 55 year old female with a history of depression and anxiety.  She continues to do well on her current regimen.  She will continue Cymbalta 60 mg twice daily for depression, Abilify 2 mg daily for augmentation, Xanax 1 mg 3 times daily for anxiety and temazepam 30 mg at bedtime as needed for sleep.  She will return to see me in 3 months   Diannia Ruder, MD 09/24/2020, 9:56 AM

## 2020-10-06 ENCOUNTER — Other Ambulatory Visit (HOSPITAL_COMMUNITY): Payer: Self-pay | Admitting: Psychiatry

## 2020-11-15 DIAGNOSIS — M791 Myalgia, unspecified site: Secondary | ICD-10-CM | POA: Diagnosis not present

## 2020-11-15 DIAGNOSIS — J302 Other seasonal allergic rhinitis: Secondary | ICD-10-CM | POA: Diagnosis not present

## 2020-11-15 DIAGNOSIS — M797 Fibromyalgia: Secondary | ICD-10-CM | POA: Diagnosis not present

## 2020-11-15 DIAGNOSIS — J0100 Acute maxillary sinusitis, unspecified: Secondary | ICD-10-CM | POA: Diagnosis not present

## 2020-11-15 DIAGNOSIS — Z1331 Encounter for screening for depression: Secondary | ICD-10-CM | POA: Diagnosis not present

## 2020-11-15 DIAGNOSIS — Z6841 Body Mass Index (BMI) 40.0 and over, adult: Secondary | ICD-10-CM | POA: Diagnosis not present

## 2020-12-19 ENCOUNTER — Telehealth (HOSPITAL_COMMUNITY): Payer: BC Managed Care – PPO | Admitting: Psychiatry

## 2021-01-01 ENCOUNTER — Telehealth (INDEPENDENT_AMBULATORY_CARE_PROVIDER_SITE_OTHER): Payer: BC Managed Care – PPO | Admitting: Psychiatry

## 2021-01-01 ENCOUNTER — Other Ambulatory Visit: Payer: Self-pay

## 2021-01-01 ENCOUNTER — Encounter (HOSPITAL_COMMUNITY): Payer: Self-pay | Admitting: Psychiatry

## 2021-01-01 DIAGNOSIS — F321 Major depressive disorder, single episode, moderate: Secondary | ICD-10-CM | POA: Diagnosis not present

## 2021-01-01 DIAGNOSIS — F411 Generalized anxiety disorder: Secondary | ICD-10-CM | POA: Diagnosis not present

## 2021-01-01 MED ORDER — DULOXETINE HCL 60 MG PO CPEP: 60.0000 mg | ORAL_CAPSULE | Freq: Two times a day (BID) | ORAL | 2 refills | Status: DC

## 2021-01-01 MED ORDER — ARIPIPRAZOLE 2 MG PO TABS: 2.0000 mg | ORAL_TABLET | ORAL | 2 refills | Status: DC

## 2021-01-01 MED ORDER — ALPRAZOLAM 1 MG PO TABS: 1.0000 mg | ORAL_TABLET | Freq: Three times a day (TID) | ORAL | 2 refills | Status: DC | PRN

## 2021-01-01 MED ORDER — TEMAZEPAM 30 MG PO CAPS
30.0000 mg | ORAL_CAPSULE | Freq: Every evening | ORAL | 2 refills | Status: DC | PRN
Start: 2021-01-01 — End: 2021-04-03

## 2021-01-01 NOTE — Progress Notes (Signed)
Virtual Visit via Telephone Note  I connected with Virginia Trujillo on 01/01/21 at  1:00 PM EDT by telephone and verified that I am speaking with the correct person using two identifiers.  Location: Patient: home Provider: office   I discussed the limitations, risks, security and privacy concerns of performing an evaluation and management service by telephone and the availability of in person appointments. I also discussed with the patient that there may be a patient responsible charge related to this service. The patient expressed understanding and agreed to proceed.      I discussed the assessment and treatment plan with the patient. The patient was provided an opportunity to ask questions and all were answered. The patient agreed with the plan and demonstrated an understanding of the instructions.   The patient was advised to call back or seek an in-person evaluation if the symptoms worsen or if the condition fails to improve as anticipated.  I provided of non-face-to-face time during this encounter.   Diannia Ruder, MD  [facility] MD/PA/NP OP Progress Note  01/01/2021 1:17 PM Virginia Trujillo  MRN:  [id number]  Chief Complaint:  Chief Complaint   Depression; Anxiety; Follow-up    HPI: This patient is a 55 year old married black female who lives with her husband in [place].  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  The patient returns for follow-up after 3 months regarding her depression and anxiety.  She states a couple of months ago she was getting more depressed and did not know what to do with herself.  Her husband suggested that she go back to work.  She got the job at [employer] where her cousin works.  She is very happy there.  She works full-time and stays very busy and gets to meet new people.  She states that her mood now is good and she denies serious depression or anxiety attacks.  For the most part she is sleeping well.  She states  that 2 of her siblings recently tried to commit suicide but both of them were able to get help. Visit Diagnosis:    ICD-10-CM   1. Generalized anxiety disorder  F41.1     2. 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 episode (HCC)  F32.1       Past Psychiatric History: none  Past Medical History:  Past Medical History:  Diagnosis Date   Acid reflux    Anxiety    Depression    Fibromyalgia     Past Surgical History:  Procedure Laterality Date   CESAREAN SECTION     MOUTH SURGERY     tubes tied      Family Psychiatric History: see below  Family History:  Family History  Problem Relation Age of Onset   Hypertension Father    Hypertension Brother    Diabetes Brother    Depression Brother    Anxiety disorder Brother    Stroke Maternal Grandmother    Stroke Paternal Grandfather    Congestive Heart Failure Sister    Diabetes Sister    Hypertension Sister    Anxiety disorder Sister    Depression Sister    Heart disease Unknown    Arthritis Unknown    Cancer Unknown    Asthma Unknown    Diabetes Unknown    Kidney disease Unknown     Social History:  Social History   Socioeconomic History   Marital status: Married    Spouse  name: Not on file   Number of children: 2   Years of education: 52   Highest education level: Not on file  Occupational History   Occupation: none    Employer: UNEMPLOYED   Occupation: Unemployed   Tobacco Use   Smoking status: Never   Smokeless tobacco: Never  Substance and Sexual Activity   Alcohol use: No   Drug use: No   Sexual activity: Yes    Birth control/protection: Surgical  Other Topics Concern   Not on file  Social History Narrative   Patient lives at home with husband and son.    Patient does not work   Patient has a high school education    Patient has 2 children.   Social Determinants of Health   Financial Resource Strain: Not on file  Food Insecurity: Not on file  Transportation  Needs: Not on file  Physical Activity: Not on file  Stress: Not on file  Social Connections: Not on file    Allergies:  Allergies  Allergen Reactions   Prednisone Other (See Comments)    Depression and crying   Ambien [Zolpidem Tartrate] Other (See Comments)    hallucinations   Penicillins     Metabolic Disorder Labs: No results found for: HGBA1C, MPG No results found for: PROLACTIN No results found for: CHOL, TRIG, HDL, CHOLHDL, VLDL, LDLCALC Lab Results  Component Value Date   TSH 3.190 11/29/2013    Therapeutic Level Labs: No results found for: LITHIUM No results found for: VALPROATE No components found for:  CBMZ  Current Medications: Current Outpatient Medications  Medication Sig Dispense Refill   Acetaminophen (TYLENOL 8 HOUR PO) Take by mouth.       albuterol (PROVENTIL) (2.5 MG/3ML) 0.083% nebulizer solution Take 2.5 mg by nebulization every 6 (six) hours as needed for wheezing or shortness of breath.     ALPRAZolam (XANAX) 1 MG tablet Take 1 tablet (1 mg total) by mouth 3 (three) times daily as needed for anxiety. 90 tablet 2   ARIPiprazole (ABILIFY) 2 MG tablet Take 1 tablet (2 mg total) by mouth every morning. 30 tablet 2   baclofen (LIORESAL) 10 MG tablet Take 10 mg by mouth 3 (three) times daily.     Cholecalciferol (VITAMIN D PO) Take by mouth.     doxycycline (VIBRAMYCIN) 100 MG capsule Take 100 mg by mouth 2 (two) times daily.     DULoxetine (CYMBALTA) 60 MG capsule Take 1 capsule (60 mg total) by mouth 2 (two) times daily. 60 capsule 2   gabapentin (NEURONTIN) 600 MG tablet daily  1   meloxicam (MOBIC) 15 MG tablet Take 15 mg by mouth daily.       montelukast (SINGULAIR) 10 MG tablet Take 10 mg by mouth at bedtime.     omeprazole (PRILOSEC) 40 MG capsule Take 1 capsule (40 mg total) by mouth daily. Appointment needed for further refills (Patient not taking: Reported on 06/07/2019) 30 capsule 3   SUMAtriptan (IMITREX) 25 MG tablet Take 25 mg by mouth every 2  (two) hours as needed for migraine. May repeat in 2 hours if headache persists or recurs.     temazepam (RESTORIL) 30 MG capsule Take 1 capsule (30 mg total) by mouth at bedtime as needed for sleep. 30 capsule 2   traMADol (ULTRAM-ER) 300 MG 24 hr tablet Take 300 mg by mouth daily.     No current facility-administered medications for this visit.     Musculoskeletal: Strength & Muscle Tone: na  Gait & Station: na Patient leans: na  Psychiatric Specialty Exam: Review of Systems  All other systems reviewed and are negative.  There were no vitals taken for this visit.There is no height or weight on file to calculate BMI.  General Appearance: NA  Eye Contact:  NA  Speech:  Clear and Coherent  Volume:  Normal  Mood:  Euthymic  Affect:  NA  Thought Process:  Goal Directed  Orientation:  Full (Time, Place, and Person)  Thought Content: WDL   Suicidal Thoughts:  No  Homicidal Thoughts:  No  Memory:  Immediate;   Good Recent;   Good Remote;   Fair  Judgement:  Good  Insight:  Good  Psychomotor Activity:  Normal  Concentration:  Concentration: Good and Attention Span: Good  Recall:  Good  Fund of Knowledge: Good  Language: Good  Akathisia:  No  Handed:  Right  AIMS (if indicated): not done  Assets:  Communication Skills Desire for Improvement Physical Health Resilience Social Support Talents/Skills  ADL's:  Intact  Cognition: WNL  Sleep:  Good   Screenings: GAD-7    Flowsheet Row Counselor from 11/10/2017 in [facility]  Total GAD-7 Score 19      PHQ2-9    Flowsheet Row Video Visit from 01/01/2021 in [facility] Video Visit from 09/24/2020 in [facility] Video Visit from 06/27/2020 in [facility]  PHQ-2 Total Score 0 1 2  PHQ-9 Total Score -- -- 6      Flowsheet Row Video Visit from 01/01/2021 in  [facility] Video Visit from 09/24/2020 in [facility]  C-SSRS RISK CATEGORY No Risk No Risk        Assessment and Plan: This patient is a 55 year old female with a history of depression and anxiety.  She continues to do well on her current regimen.  She will continue Cymbalta 60 mg twice daily for depression, Abilify 2 mg daily for augmentation, Xanax 1 mg 3 times daily for anxiety and temazepam 30 mg at bedtime as needed for sleep.  She will return to see me in 3 months   Diannia Ruder, MD 01/01/2021, 1:17 PM

## 2021-04-03 ENCOUNTER — Telehealth (INDEPENDENT_AMBULATORY_CARE_PROVIDER_SITE_OTHER): Payer: BC Managed Care – PPO | Admitting: Psychiatry

## 2021-04-03 ENCOUNTER — Other Ambulatory Visit: Payer: Self-pay

## 2021-04-03 ENCOUNTER — Encounter (HOSPITAL_COMMUNITY): Payer: Self-pay | Admitting: Psychiatry

## 2021-04-03 DIAGNOSIS — F321 Major depressive disorder, single episode, moderate: Secondary | ICD-10-CM | POA: Diagnosis not present

## 2021-04-03 DIAGNOSIS — F411 Generalized anxiety disorder: Secondary | ICD-10-CM | POA: Diagnosis not present

## 2021-04-03 MED ORDER — ARIPIPRAZOLE 2 MG PO TABS: 2.0000 mg | ORAL_TABLET | ORAL | 2 refills | Status: DC

## 2021-04-03 MED ORDER — ALPRAZOLAM 1 MG PO TABS
1.0000 mg | ORAL_TABLET | Freq: Three times a day (TID) | ORAL | 2 refills | Status: DC | PRN
Start: 2021-04-03 — End: 2021-06-03

## 2021-04-03 MED ORDER — DULOXETINE HCL 60 MG PO CPEP: 60.0000 mg | ORAL_CAPSULE | Freq: Two times a day (BID) | ORAL | 2 refills | Status: DC

## 2021-04-03 MED ORDER — TEMAZEPAM 30 MG PO CAPS: 30.0000 mg | ORAL_CAPSULE | Freq: Every evening | ORAL | 2 refills | Status: DC | PRN

## 2021-04-03 NOTE — Progress Notes (Signed)
Virtual Visit via Telephone Note  I connected with Virginia Trujillo on 04/03/21 at  1:20 PM EST by telephone and verified that I am speaking with the correct person using two identifiers.  Location: Patient: home Provider: office   I discussed the limitations, risks, security and privacy concerns of performing an evaluation and management service by telephone and the availability of in person appointments. I also discussed with the patient that there may be a patient responsible charge related to this service. The patient expressed understanding and agreed to proceed.    I discussed the assessment and treatment plan with the patient. The patient was provided an opportunity to ask questions and all were answered. The patient agreed with the plan and demonstrated an understanding of the instructions.   The patient was advised to call back or seek an in-person evaluation if the symptoms worsen or if the condition fails to improve as anticipated.  I provided 12 minutes of non-face-to-face time during this encounter.   Diannia Ruder, MD  [facility] MD/PA/NP OP Progress Note  04/03/2021 1:32 PM Virginia Trujillo  MRN:  [id number]  Chief Complaint:  Chief Complaint   Anxiety; Depression; Follow-up    HPI: This patient is a 55 year old married black female who lives with her husband in [place].  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  The patient returns for follow-up after 3 months.  She generally states she is doing well.  She is having some trouble sleeping because her legs start to hurt.  It sounds as if she has neuropathy although she does not have diabetes or other predisposing factors.  She is going to be seeing her family doctor soon.  She does take gabapentin at night as well as the Restoril.  For the most part she is getting enough sleep and is not particularly tired.  Her mood has been good most of the time and she really enjoys her work.  The Xanax continues to help  with the anxiety.  She denies any thoughts of self-harm or suicidal ideation Visit Diagnosis:    ICD-10-CM   1. Generalized anxiety disorder  F41.1     2. 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 episode (HCC)  F32.1       Past Psychiatric History: none  Past Medical History:  Past Medical History:  Diagnosis Date   Acid reflux    Anxiety    Depression    Fibromyalgia     Past Surgical History:  Procedure Laterality Date   CESAREAN SECTION     MOUTH SURGERY     tubes tied      Family Psychiatric History: see below Family History:  Family History  Problem Relation Age of Onset   Hypertension Father    Hypertension Brother    Diabetes Brother    Depression Brother    Anxiety disorder Brother    Stroke Maternal Grandmother    Stroke Paternal Grandfather    Congestive Heart Failure Sister    Diabetes Sister    Hypertension Sister    Anxiety disorder Sister    Depression Sister    Heart disease Unknown    Arthritis Unknown    Cancer Unknown    Asthma Unknown    Diabetes Unknown    Kidney disease Unknown     Social History:  Social History   Socioeconomic History   Marital status: Married    Spouse name: Not on file   Number of  children: 2   Years of education: 12   Highest education level: Not on file  Occupational History   Occupation: none    Employer: UNEMPLOYED   Occupation: Unemployed   Tobacco Use   Smoking status: Never   Smokeless tobacco: Never  Substance and Sexual Activity   Alcohol use: No   Drug use: No   Sexual activity: Yes    Birth control/protection: Surgical  Other Topics Concern   Not on file  Social History Narrative   Patient lives at home with husband and son.    Patient does not work   Patient has a high school education    Patient has 2 children.   Social Determinants of Health   Financial Resource Strain: Not on file  Food Insecurity: Not on file  Transportation Needs: Not on file   Physical Activity: Not on file  Stress: Not on file  Social Connections: Not on file    Allergies:  Allergies  Allergen Reactions   Prednisone Other (See Comments)    Depression and crying   Ambien [Zolpidem Tartrate] Other (See Comments)    hallucinations   Penicillins     Metabolic Disorder Labs: No results found for: HGBA1C, MPG No results found for: PROLACTIN No results found for: CHOL, TRIG, HDL, CHOLHDL, VLDL, LDLCALC Lab Results  Component Value Date   TSH 3.190 11/29/2013    Therapeutic Level Labs: No results found for: LITHIUM No results found for: VALPROATE No components found for:  CBMZ  Current Medications: Current Outpatient Medications  Medication Sig Dispense Refill   Acetaminophen (TYLENOL 8 HOUR PO) Take by mouth.       albuterol (PROVENTIL) (2.5 MG/3ML) 0.083% nebulizer solution Take 2.5 mg by nebulization every 6 (six) hours as needed for wheezing or shortness of breath.     ALPRAZolam (XANAX) 1 MG tablet Take 1 tablet (1 mg total) by mouth 3 (three) times daily as needed for anxiety. 90 tablet 2   ARIPiprazole (ABILIFY) 2 MG tablet Take 1 tablet (2 mg total) by mouth every morning. 30 tablet 2   baclofen (LIORESAL) 10 MG tablet Take 10 mg by mouth 3 (three) times daily.     Cholecalciferol (VITAMIN D PO) Take by mouth.     DULoxetine (CYMBALTA) 60 MG capsule Take 1 capsule (60 mg total) by mouth 2 (two) times daily. 60 capsule 2   gabapentin (NEURONTIN) 600 MG tablet daily  1   montelukast (SINGULAIR) 10 MG tablet Take 10 mg by mouth at bedtime.     SUMAtriptan (IMITREX) 25 MG tablet Take 25 mg by mouth every 2 (two) hours as needed for migraine. May repeat in 2 hours if headache persists or recurs.     temazepam (RESTORIL) 30 MG capsule Take 1 capsule (30 mg total) by mouth at bedtime as needed for sleep. 30 capsule 2   traMADol (ULTRAM-ER) 300 MG 24 hr tablet Take 300 mg by mouth daily.     No current facility-administered medications for this  visit.     Musculoskeletal: Strength & Muscle Tone: na Gait & Station: na Patient leans: N/A  Psychiatric Specialty Exam: Review of Systems  Musculoskeletal:  Positive for arthralgias and myalgias.  Psychiatric/Behavioral:  Positive for sleep disturbance.   All other systems reviewed and are negative.  There were no vitals taken for this visit.There is no height or weight on file to calculate BMI.  General Appearance: NA  Eye Contact:  NA  Speech:  Clear and Coherent  Volume:  Normal  Mood:  Euthymic  Affect:  NA  Thought Process:  Goal Directed  Orientation:  Full (Time, Place, and Person)  Thought Content: WDL   Suicidal Thoughts:  No  Homicidal Thoughts:  No  Memory:  Immediate;   Good Recent;   Good Remote;   Fair  Judgement:  Good  Insight:  Fair  Psychomotor Activity:  Normal  Concentration:  Concentration: Good and Attention Span: Good  Recall:  Good  Fund of Knowledge: Good  Language: Good  Akathisia:  No  Handed:  Right  AIMS (if indicated): not done  Assets:  Communication Skills Desire for Improvement Resilience Social Support Talents/Skills  ADL's:  Intact  Cognition: WNL  Sleep:  Fair   Screenings: GAD-7    Advertising copywriter from 11/10/2017 in [facility]  Total GAD-7 Score 19      PHQ2-9    Flowsheet Row Video Visit from 04/03/2021 in [facility] Video Visit from 01/01/2021 in [facility] Video Visit from 09/24/2020 in [facility] Video Visit from 06/27/2020 in [facility]  PHQ-2 Total Score 0 0 1 2  PHQ-9 Total Score -- -- -- 6      Flowsheet Row Video Visit from 04/03/2021 in [facility] Video Visit from 01/01/2021 in [facility] Video Visit  from 09/24/2020 in [facility]  C-SSRS RISK CATEGORY No Risk No Risk No Risk        Assessment and Plan: This patient is a 55 year old female with a history of depression and anxiety.  She continues to do well on her current regimen for the most part.  Her sleeping difficulties may have more to do with restless legs or other concerns.  She will continue Cymbalta 60 mg twice daily for depression, Abilify 2 mg daily for augmentation, Xanax 1 mg 3 times daily for anxiety and temazepam 30 mg at bedtime as needed for sleep.  She will return to see me in 3 months   Diannia Ruder, MD 04/03/2021, 1:32 PM

## 2021-05-20 DIAGNOSIS — J302 Other seasonal allergic rhinitis: Secondary | ICD-10-CM | POA: Diagnosis not present

## 2021-05-20 DIAGNOSIS — M797 Fibromyalgia: Secondary | ICD-10-CM | POA: Diagnosis not present

## 2021-05-20 DIAGNOSIS — Z6841 Body Mass Index (BMI) 40.0 and over, adult: Secondary | ICD-10-CM | POA: Diagnosis not present

## 2021-05-20 DIAGNOSIS — J028 Acute pharyngitis due to other specified organisms: Secondary | ICD-10-CM | POA: Diagnosis not present

## 2021-05-20 DIAGNOSIS — Z13220 Encounter for screening for lipoid disorders: Secondary | ICD-10-CM | POA: Diagnosis not present

## 2021-05-20 DIAGNOSIS — Z23 Encounter for immunization: Secondary | ICD-10-CM | POA: Diagnosis not present

## 2021-05-20 DIAGNOSIS — B9789 Other viral agents as the cause of diseases classified elsewhere: Secondary | ICD-10-CM | POA: Diagnosis not present

## 2021-05-20 DIAGNOSIS — K219 Gastro-esophageal reflux disease without esophagitis: Secondary | ICD-10-CM | POA: Diagnosis not present

## 2021-05-30 ENCOUNTER — Emergency Department (HOSPITAL_COMMUNITY): Payer: BC Managed Care – PPO

## 2021-05-30 ENCOUNTER — Encounter (HOSPITAL_COMMUNITY): Payer: Self-pay | Admitting: *Deleted

## 2021-05-30 ENCOUNTER — Emergency Department (HOSPITAL_COMMUNITY)
Admission: EM | Admit: 2021-05-30 | Discharge: 2021-05-30 | Disposition: A | Discharge status: Home / Self Care | Payer: BC Managed Care – PPO | Attending: Emergency Medicine | Admitting: Emergency Medicine

## 2021-05-30 ENCOUNTER — Other Ambulatory Visit: Payer: Self-pay

## 2021-05-30 DIAGNOSIS — W109XXA Fall (on) (from) unspecified stairs and steps, initial encounter: Secondary | ICD-10-CM | POA: Diagnosis not present

## 2021-05-30 DIAGNOSIS — S300XXA Contusion of lower back and pelvis, initial encounter: Secondary | ICD-10-CM | POA: Insufficient documentation

## 2021-05-30 DIAGNOSIS — S7002XA Contusion of left hip, initial encounter: Secondary | ICD-10-CM | POA: Diagnosis not present

## 2021-05-30 DIAGNOSIS — W19XXXA Unspecified fall, initial encounter: Secondary | ICD-10-CM

## 2021-05-30 DIAGNOSIS — S0990XA Unspecified injury of head, initial encounter: Secondary | ICD-10-CM

## 2021-05-30 DIAGNOSIS — S20212A Contusion of left front wall of thorax, initial encounter: Secondary | ICD-10-CM | POA: Diagnosis not present

## 2021-05-30 DIAGNOSIS — M25552 Pain in left hip: Secondary | ICD-10-CM | POA: Diagnosis not present

## 2021-05-30 DIAGNOSIS — Z043 Encounter for examination and observation following other accident: Secondary | ICD-10-CM | POA: Diagnosis not present

## 2021-05-30 DIAGNOSIS — S79912A Unspecified injury of left hip, initial encounter: Secondary | ICD-10-CM | POA: Diagnosis not present

## 2021-05-30 MED ORDER — HYDROCODONE-ACETAMINOPHEN 5-325 MG PO TABS
1.0000 | ORAL_TABLET | Freq: Four times a day (QID) | ORAL | 0 refills | Status: DC | PRN
Start: 2021-05-30 — End: 2021-08-01

## 2021-05-30 MED ORDER — IBUPROFEN 600 MG PO TABS: 600.0000 mg | ORAL_TABLET | Freq: Three times a day (TID) | ORAL | 0 refills | Status: DC

## 2021-05-30 MED ORDER — ONDANSETRON 4 MG PO TBDP
4.0000 mg | ORAL_TABLET | Freq: Once | ORAL | Status: AC
  Administered 2021-05-30: 4 mg via ORAL
  Filled 2021-05-30: qty 1

## 2021-05-30 MED ORDER — HYDROCODONE-ACETAMINOPHEN 5-325 MG PO TABS
1.0000 | ORAL_TABLET | Freq: Once | ORAL | Status: AC
  Administered 2021-05-30: 1 via ORAL
  Filled 2021-05-30: qty 1

## 2021-05-30 MED ORDER — ONDANSETRON HCL 4 MG PO TABS: 4.0000 mg | ORAL_TABLET | Freq: Three times a day (TID) | ORAL | 0 refills | Status: DC | PRN

## 2021-05-30 NOTE — Discharge Instructions (Addendum)
Your x-rays and CT imaging are negative for any acute internal injuries or fracture.  I do recommend ice packs as much as is comfortable for the next 2 days to help reduce pain and swelling, especially at your left hip region.  You may switch to a heating pad after the first 2 days which can help your areas of pain he will quicker, recommend 20 minutes with a heating pad 2-3 times daily.  Use the medication prescribed to help you with your pain.  You may take the hydrocodone prescribed for pain relief.  This will make you drowsy - do not drive within 4 hours of taking this medication.

## 2021-05-30 NOTE — ED Triage Notes (Signed)
Pt c/o fall down 4 stairs today after slipping on ice. Pt c/o pain to left hip, left knee, left side of head, right wrist and right knee. Denies use of blood thinners and LOC.

## 2021-05-30 NOTE — ED Provider Notes (Signed)
[facility] EMERGENCY DEPARTMENT Provider Note   CSN: [id number] Arrival date & time: 05/30/21  [id number]     History  Chief Complaint  Patient presents with   Virginia Trujillo is a 56 y.o. female with history of asthma and fibromyalgia presenting for evaluation of fall which occurred just prior to arrival.  She was going out her front door and did not realize her steps were icy and fell, describing her feet flew out from under her and she slid down 4 steps.  On the way she hit the left side of her temple against a step edge, also describing left-sided chest and breast tenderness, left hip pain and right knee pain which she states she injured as she was trying to crawl back up the steps.  She denies LOC but notes some dizziness after the event which is improving but still present.  She has had no nausea or vomiting, husband at the bedside endorses no confusion, she denies any numbness or weakness.  She has had no treatment prior to arrival.  The history is provided by the patient.      Home Medications Prior to Admission medications   Medication Sig Start Date End Date Taking? Authorizing Provider  HYDROcodone-acetaminophen (NORCO/VICODIN) 5-325 MG tablet Take 1 tablet by mouth every 6 (six) hours as needed for severe pain. 05/30/21  Yes Dietrich Samuelson, Almyra Free, PA-C  ibuprofen (ADVIL) 600 MG tablet Take 1 tablet (600 mg total) by mouth 3 (three) times daily. 05/30/21  Yes Janae Bonser, Almyra Free, PA-C  ondansetron (ZOFRAN) 4 MG tablet Take 1 tablet (4 mg total) by mouth every 8 (eight) hours as needed for nausea or vomiting. 05/30/21  Yes Nicklous Aburto, Almyra Free, PA-C  Acetaminophen (TYLENOL 8 HOUR PO) Take by mouth.      [provider]  albuterol (PROVENTIL) (2.5 MG/3ML) 0.083% nebulizer solution Take 2.5 mg by nebulization every 6 (six) hours as needed for wheezing or shortness of breath.    [provider]  ALPRAZolam Duanne Moron) 1 MG tablet Take 1 tablet (1 mg total) by mouth 3 (three) times daily as  needed for anxiety. 04/03/21 04/03/22  Cloria Spring, MD  ARIPiprazole (ABILIFY) 2 MG tablet Take 1 tablet (2 mg total) by mouth every morning. 04/03/21   Cloria Spring, MD  baclofen (LIORESAL) 10 MG tablet Take 10 mg by mouth 3 (three) times daily.    [provider]  Cholecalciferol (VITAMIN D PO) Take by mouth.    [provider]  DULoxetine (CYMBALTA) 60 MG capsule Take 1 capsule (60 mg total) by mouth 2 (two) times daily. 04/03/21   Cloria Spring, MD  gabapentin (NEURONTIN) 600 MG tablet daily 07/11/15   [provider]  montelukast (SINGULAIR) 10 MG tablet Take 10 mg by mouth at bedtime.    [provider]  SUMAtriptan (IMITREX) 25 MG tablet Take 25 mg by mouth every 2 (two) hours as needed for migraine. May repeat in 2 hours if headache persists or recurs.    [provider]  temazepam (RESTORIL) 30 MG capsule Take 1 capsule (30 mg total) by mouth at bedtime as needed for sleep. 04/03/21   Cloria Spring, MD      Allergies    Prednisone, Ambien [zolpidem tartrate], and Penicillins    Review of Systems   Review of Systems  Constitutional: Negative.   HENT: Negative.    Eyes:  Negative for visual disturbance.  Cardiovascular:  Positive for chest pain.  Gastrointestinal:  Negative.  Negative for nausea and vomiting.  Musculoskeletal:  Positive for arthralgias. Negative for back pain and neck pain.  Skin: Negative.   Neurological:  Positive for dizziness. Negative for headaches.  Psychiatric/Behavioral:  Negative for confusion.    Physical Exam Updated Vital Signs BP 119/75    Pulse 70    Temp 98 F (36.7 C) (Oral)    Resp 18    Ht 5' (1.524 m)    Wt 115.2 kg    SpO2 97%    BMI 49.61 kg/m  Physical Exam Vitals and nursing note reviewed.  Constitutional:      Appearance: She is well-developed.  HENT:     Head: Normocephalic.     Comments: Mild ttp left temporal area, no hematoma present.  No palpable deformity.. Eyes:      Conjunctiva/sclera: Conjunctivae normal.  Cardiovascular:     Rate and Rhythm: Normal rate and regular rhythm.     Heart sounds: Normal heart sounds.  Pulmonary:     Effort: Pulmonary effort is normal.     Breath sounds: Normal breath sounds. No decreased air movement. No decreased breath sounds, wheezing or rhonchi.  Chest:       Comments: Reproducible pain left chest and upper left breast.  No edema or bruising, no deformity appreciated. Abdominal:     General: Bowel sounds are normal.     Palpations: Abdomen is soft.     Tenderness: There is no abdominal tenderness.  Musculoskeletal:        General: Normal range of motion.     Right wrist: No deformity, effusion or tenderness. Normal range of motion.     Cervical back: Normal range of motion.     Left hip: Bony tenderness present.     Right knee: No effusion or erythema. Normal range of motion. Tenderness present over the lateral joint line.     Left knee: Normal. No effusion or erythema. No tenderness.     Comments: Left hip ttp greater trochanter, no bruising, mild hematoma noted lateral left buttock.   Skin:    General: Skin is warm and dry.  Neurological:     Mental Status: She is alert.    ED Results / Procedures / Treatments   Labs (all labs ordered are listed, but only abnormal results are displayed) Labs Reviewed - No data to display  EKG None  Radiology No results found.  DG Ribs Unilateral W/Chest Left  Result Date: 05/30/2021 CLINICAL DATA:  Patient fell down stairs EXAM: LEFT RIBS AND CHEST - 3+ VIEW COMPARISON:  None. FINDINGS: No fracture or other bone lesions are seen involving the ribs. There is no evidence of pneumothorax or pleural effusion. Both lungs are clear. Heart size and mediastinal contours are within normal limits. IMPRESSION: No rib fracture.  No pneumothorax. Electronically Signed   By: Suzy Bouchard M.D.   On: 05/30/2021 10:08   CT Head Wo Contrast  Result Date: 05/30/2021 CLINICAL  DATA:  Head trauma. Fall down 4 stairs today after slipping on ice. EXAM: CT HEAD WITHOUT CONTRAST TECHNIQUE: Contiguous axial images were obtained from the base of the skull through the vertex without intravenous contrast. RADIATION DOSE REDUCTION: This exam was performed according to the departmental dose-optimization program which includes automated exposure control, adjustment of the mA and/or kV according to patient size and/or use of iterative reconstruction technique. COMPARISON:  CT brain 05/17/2008 FINDINGS: Brain: The ventricles are normal in size and configuration. The basilar cisterns are patent. No mass,  mass effect, or midline shift. No acute intracranial hemorrhage is seen. No abnormal extra-axial fluid collection. Preservation of the normal cortical gray-white interface without CT evidence of an acute major vascular territorial cortical based infarction. Vascular: No hyperdense vessel or unexpected calcification. Skull: Normal. Negative for fracture or focal lesion. Sinuses/Orbits: The visualized orbits are unremarkable. The visualized paranasal sinuses and mastoid air cells are clear. Other: None. IMPRESSION: No acute intracranial process. Electronically Signed   By: Yvonne Kendall M.D.   On: 05/30/2021 09:43   DG Knee Complete 4 Views Right  Result Date: 05/30/2021 CLINICAL DATA:  Fall down 4 stairs EXAM: RIGHT KNEE - COMPLETE 4+ VIEW COMPARISON:  None. FINDINGS: There is no acute fracture or dislocation. Knee alignment is normal. There is mild medial and lateral compartment joint space narrowing with minimal osteophytosis. The soft tissues are unremarkable. There is no effusion. IMPRESSION: No acute fracture or dislocation. Electronically Signed   By: Valetta Mole M.D.   On: 05/30/2021 10:04   DG Hip Unilat W or Wo Pelvis 2-3 Views Left  Result Date: 05/30/2021 CLINICAL DATA:  Fall down stairs after slipping on ice. Left hip pain. EXAM: DG HIP (WITH OR WITHOUT PELVIS) 2-3V LEFT COMPARISON:   None. FINDINGS: AP view of the pelvis and AP/frog leg views of the left hip. Femoral heads are located. No acute fracture. IMPRESSION: No acute osseous abnormality. Electronically Signed   By: Abigail Miyamoto M.D.   On: 05/30/2021 10:05    Procedures Procedures    Medications Ordered in ED Medications  HYDROcodone-acetaminophen (NORCO/VICODIN) 5-325 MG per tablet 1 tablet (1 tablet Oral Given 05/30/21 0957)  ondansetron (ZOFRAN-ODT) disintegrating tablet 4 mg (4 mg Oral Given 05/30/21 1119)    ED Course/ Medical Decision Making/ A&P                           Medical Decision Making Pt with street level fall/slip down icy steps with minor head injury, no hx suggesting acute concussion.  Multiple areas of arthralgia without deformity.  Amount and/or Complexity of Data Reviewed Radiology: ordered and independent interpretation performed.    Details: Imaging reviewed and negative for acute fracture/dislocation.  CT negative for subdural or skull fracture.  Risk Prescription drug management. Decision regarding hospitalization. Risk Details: Pt ambulatory in dept.  No indication for admission. Home care instructions and return precautions outlined.           Final Clinical Impression(s) / ED Diagnoses Final diagnoses:  Fall, initial encounter  Minor head injury, initial encounter  Contusion of left hip, initial encounter  Contusion of left front wall of thorax, initial encounter    Rx / DC Orders ED Discharge Orders          Ordered    ibuprofen (ADVIL) 600 MG tablet  3 times daily        05/30/21 1045    HYDROcodone-acetaminophen (NORCO/VICODIN) 5-325 MG tablet  Every 6 hours PRN        05/30/21 1058    ondansetron (ZOFRAN) 4 MG tablet  Every 8 hours PRN        05/30/21 1058              Evalee [facility], PA-C 06/01/21 1756    Truddie Hidden, MD 06/04/21 [phone]

## 2021-06-03 ENCOUNTER — Other Ambulatory Visit: Payer: Self-pay

## 2021-06-03 ENCOUNTER — Telehealth (INDEPENDENT_AMBULATORY_CARE_PROVIDER_SITE_OTHER): Payer: BC Managed Care – PPO | Admitting: Psychiatry

## 2021-06-03 ENCOUNTER — Encounter (HOSPITAL_COMMUNITY): Payer: Self-pay | Admitting: Psychiatry

## 2021-06-03 DIAGNOSIS — F321 Major depressive disorder, single episode, moderate: Secondary | ICD-10-CM | POA: Diagnosis not present

## 2021-06-03 DIAGNOSIS — F411 Generalized anxiety disorder: Secondary | ICD-10-CM

## 2021-06-03 MED ORDER — TEMAZEPAM 30 MG PO CAPS: 30.0000 mg | ORAL_CAPSULE | Freq: Every evening | ORAL | 2 refills | Status: DC | PRN

## 2021-06-03 MED ORDER — ARIPIPRAZOLE 2 MG PO TABS: 2.0000 mg | ORAL_TABLET | ORAL | 2 refills | Status: DC

## 2021-06-03 MED ORDER — ALPRAZOLAM 1 MG PO TABS: 1.0000 mg | ORAL_TABLET | Freq: Three times a day (TID) | ORAL | 2 refills | Status: DC | PRN

## 2021-06-03 MED ORDER — DULOXETINE HCL 60 MG PO CPEP: 60.0000 mg | ORAL_CAPSULE | Freq: Two times a day (BID) | ORAL | 2 refills | Status: DC

## 2021-06-03 NOTE — Progress Notes (Signed)
Virtual Visit via Video Note  I connected with Virginia Trujillo on 06/03/21 at  1:00 PM EST by a video enabled telemedicine application and verified that I am speaking with the correct person using two identifiers.  Location: Patient: home Provider: office   I discussed the limitations of evaluation and management by telemedicine and the availability of in person appointments. The patient expressed understanding and agreed to proceed.     I discussed the assessment and treatment plan with the patient. The patient was provided an opportunity to ask questions and all were answered. The patient agreed with the plan and demonstrated an understanding of the instructions.   The patient was advised to call back or seek an in-person evaluation if the symptoms worsen or if the condition fails to improve as anticipated.  I provided 12 minutes of non-face-to-face time during this encounter.   Diannia Ruder, MD  [facility] MD/PA/NP OP Progress Note  06/03/2021 1:12 PM Virginia Trujillo  MRN:  [id number]  Chief Complaint:  Chief Complaint   Anxiety; Depression; Follow-up    HPI: This patient is a 56 year old married black female who lives with her husband in [place].  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  The patient returns for follow-up after 3 months.  For the most part she is doing okay.  She enjoys her job.  However last week she fell down her back stairs and really bruised up her body.  She was seen in the emergency room and had x-rays of her head arms pelvis etc. nothing showed up.  It seems like it is just really bad bruising.  She is also somewhat down because her cousin recently died in a motor vehicle accident.  Overall however she denies significant depression anxiety difficulty sleeping thoughts of self-harm or suicidal ideation. Visit Diagnosis:    ICD-10-CM   1. Generalized anxiety disorder  F41.1     2. 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  episode (HCC)  F32.1       Past Psychiatric History: none  Past Medical History:  Past Medical History:  Diagnosis Date   Acid reflux    Anxiety    Depression    Fibromyalgia     Past Surgical History:  Procedure Laterality Date   CESAREAN SECTION     MOUTH SURGERY     tubes tied      Family Psychiatric History: see below  Family History:  Family History  Problem Relation Age of Onset   Hypertension Father    Hypertension Brother    Diabetes Brother    Depression Brother    Anxiety disorder Brother    Stroke Maternal Grandmother    Stroke Paternal Grandfather    Congestive Heart Failure Sister    Diabetes Sister    Hypertension Sister    Anxiety disorder Sister    Depression Sister    Heart disease Unknown    Arthritis Unknown    Cancer Unknown    Asthma Unknown    Diabetes Unknown    Kidney disease Unknown     Social History:  Social History   Socioeconomic History   Marital status: Married    Spouse name: Not on file   Number of children: 2   Years of education: 12   Highest education level: Not on file  Occupational History   Occupation: none    Employer: UNEMPLOYED   Occupation: Unemployed   Tobacco Use   Smoking status: Never  Smokeless tobacco: Never  Vaping Use   Vaping Use: Never used  Substance and Sexual Activity   Alcohol use: No   Drug use: No   Sexual activity: Yes    Birth control/protection: Surgical  Other Topics Concern   Not on file  Social History Narrative   Patient lives at home with husband and son.    Patient does not work   Patient has a high school education    Patient has 2 children.   Social Determinants of Health   Financial Resource Strain: Not on file  Food Insecurity: Not on file  Transportation Needs: Not on file  Physical Activity: Not on file  Stress: Not on file  Social Connections: Not on file    Allergies:  Allergies  Allergen Reactions   Prednisone Other (See Comments)    Depression and  crying   Ambien [Zolpidem Tartrate] Other (See Comments)    hallucinations   Penicillins     Metabolic Disorder Labs: No results found for: HGBA1C, MPG No results found for: PROLACTIN No results found for: CHOL, TRIG, HDL, CHOLHDL, VLDL, LDLCALC Lab Results  Component Value Date   TSH 3.190 11/29/2013    Therapeutic Level Labs: No results found for: LITHIUM No results found for: VALPROATE No components found for:  CBMZ  Current Medications: Current Outpatient Medications  Medication Sig Dispense Refill   Acetaminophen (TYLENOL 8 HOUR PO) Take by mouth.       albuterol (PROVENTIL) (2.5 MG/3ML) 0.083% nebulizer solution Take 2.5 mg by nebulization every 6 (six) hours as needed for wheezing or shortness of breath.     ALPRAZolam (XANAX) 1 MG tablet Take 1 tablet (1 mg total) by mouth 3 (three) times daily as needed for anxiety. 90 tablet 2   ARIPiprazole (ABILIFY) 2 MG tablet Take 1 tablet (2 mg total) by mouth every morning. 30 tablet 2   baclofen (LIORESAL) 10 MG tablet Take 10 mg by mouth 3 (three) times daily.     Cholecalciferol (VITAMIN D PO) Take by mouth.     DULoxetine (CYMBALTA) 60 MG capsule Take 1 capsule (60 mg total) by mouth 2 (two) times daily. 60 capsule 2   gabapentin (NEURONTIN) 600 MG tablet daily  1   HYDROcodone-acetaminophen (NORCO/VICODIN) 5-325 MG tablet Take 1 tablet by mouth every 6 (six) hours as needed for severe pain. 12 tablet 0   ibuprofen (ADVIL) 600 MG tablet Take 1 tablet (600 mg total) by mouth 3 (three) times daily. 15 tablet 0   montelukast (SINGULAIR) 10 MG tablet Take 10 mg by mouth at bedtime.     ondansetron (ZOFRAN) 4 MG tablet Take 1 tablet (4 mg total) by mouth every 8 (eight) hours as needed for nausea or vomiting. 12 tablet 0   SUMAtriptan (IMITREX) 25 MG tablet Take 25 mg by mouth every 2 (two) hours as needed for migraine. May repeat in 2 hours if headache persists or recurs.     temazepam (RESTORIL) 30 MG capsule Take 1 capsule (30 mg  total) by mouth at bedtime as needed for sleep. 30 capsule 2   No current facility-administered medications for this visit.     Musculoskeletal: Strength & Muscle Tone: na Gait & Station: na Patient leans: N/A  Psychiatric Specialty Exam: Review of Systems  Musculoskeletal:  Positive for arthralgias and myalgias.  All other systems reviewed and are negative.  There were no vitals taken for this visit.There is no height or weight on file to calculate BMI.  General Appearance: NA  Eye Contact:  NA  Speech:  Clear and Coherent  Volume:  Normal  Mood:  Anxious and Euthymic  Affect:  NA  Thought Process:  Goal Directed  Orientation:  Full (Time, Place, and Person)  Thought Content: WDL   Suicidal Thoughts:  No  Homicidal Thoughts:  No  Memory:  Immediate;   Good Recent;   Good Remote;   Fair  Judgement:  Good  Insight:  Fair  Psychomotor Activity:  Decreased  Concentration:  Concentration: Good and Attention Span: Good  Recall:  Good  Fund of Knowledge: Good  Language: Good  Akathisia:  No  Handed:  Right  AIMS (if indicated): not done  Assets:  Communication Skills Desire for Improvement Resilience Social Support Talents/Skills  ADL's:  Intact  Cognition: WNL  Sleep:  Good   Screenings: GAD-7    Flowsheet Row Counselor from 11/10/2017 [facility]  Total GAD-7 Score 19      PHQ2-9    Flowsheet Row Video Visit from 06/03/2021 in [facility] Video Visit from 04/03/2021 in [facility] Video Visit from 01/01/2021 in [facility] Video Visit from 09/24/2020 in [facility] Video Visit from 06/27/2020 in [facility]  PHQ-2 Total Score 0 0 0 1 2  PHQ-9 Total Score -- -- -- -- 6      Flowsheet Row Video Visit from  06/03/2021 [facility] [facility] Video Visit from 04/03/2021 in [facility]  C-SSRS RISK CATEGORY No Risk No Risk No Risk        Assessment and Plan: This patient is a 56 year old female with a history of depression and anxiety.  Despite her recent injury she is doing well on her current regimen.  She will continue Cymbalta 60 mg twice daily for depression, Abilify 2 mg daily for augmentation, Xanax 1 mg 3 times daily for anxiety and temazepam 30 mg at bedtime as needed for sleep.  She will return to see me in 1379-month   Diannia Rudereborah Stephanie Mcglone, MD 06/03/2021, 1:12 PM

## 2021-07-28 ENCOUNTER — Emergency Department (HOSPITAL_COMMUNITY)
Admission: EM | Admit: 2021-07-28 | Discharge: 2021-07-28 | Disposition: A | Discharge status: Home / Self Care | Payer: BC Managed Care – PPO | Attending: Emergency Medicine | Admitting: Emergency Medicine

## 2021-07-28 ENCOUNTER — Encounter (HOSPITAL_COMMUNITY): Payer: Self-pay | Admitting: *Deleted

## 2021-07-28 ENCOUNTER — Other Ambulatory Visit: Payer: Self-pay

## 2021-07-28 ENCOUNTER — Emergency Department (HOSPITAL_COMMUNITY): Payer: BC Managed Care – PPO

## 2021-07-28 DIAGNOSIS — R1010 Upper abdominal pain, unspecified: Secondary | ICD-10-CM

## 2021-07-28 DIAGNOSIS — K8020 Calculus of gallbladder without cholecystitis without obstruction: Secondary | ICD-10-CM | POA: Diagnosis not present

## 2021-07-28 DIAGNOSIS — S20212A Contusion of left front wall of thorax, initial encounter: Secondary | ICD-10-CM | POA: Diagnosis not present

## 2021-07-28 DIAGNOSIS — R1084 Generalized abdominal pain: Secondary | ICD-10-CM | POA: Diagnosis not present

## 2021-07-28 DIAGNOSIS — K760 Fatty (change of) liver, not elsewhere classified: Secondary | ICD-10-CM | POA: Diagnosis not present

## 2021-07-28 DIAGNOSIS — K828 Other specified diseases of gallbladder: Secondary | ICD-10-CM | POA: Diagnosis not present

## 2021-07-28 DIAGNOSIS — S0990XA Unspecified injury of head, initial encounter: Secondary | ICD-10-CM | POA: Diagnosis not present

## 2021-07-28 DIAGNOSIS — J0100 Acute maxillary sinusitis, unspecified: Secondary | ICD-10-CM | POA: Insufficient documentation

## 2021-07-28 LAB — CBC WITH DIFFERENTIAL/PLATELET
Abs Immature Granulocytes: 0.02 10*3/uL (ref 0.00–0.07)
Basophils Absolute: 0 10*3/uL (ref 0.0–0.1)
Basophils Relative: 0 %
Eosinophils Absolute: 0.1 10*3/uL (ref 0.0–0.5)
Eosinophils Relative: 1 %
HCT: 36.7 % (ref 36.0–46.0)
Hemoglobin: 12 g/dL (ref 12.0–15.0)
Immature Granulocytes: 0 %
Lymphocytes Relative: 21 %
Lymphs Abs: 1.5 10*3/uL (ref 0.7–4.0)
MCH: 32.6 pg (ref 26.0–34.0)
MCHC: 32.7 g/dL (ref 30.0–36.0)
MCV: 99.7 fL (ref 80.0–100.0)
Monocytes Absolute: 0.4 10*3/uL (ref 0.1–1.0)
Monocytes Relative: 6 %
Neutro Abs: 5.1 10*3/uL (ref 1.7–7.7)
Neutrophils Relative %: 72 %
Platelets: 164 10*3/uL (ref 150–400)
RBC: 3.68 MIL/uL — ABNORMAL LOW (ref 3.87–5.11)
RDW: 12.2 % (ref 11.5–15.5)
WBC: 7.1 10*3/uL (ref 4.0–10.5)
nRBC: 0 % (ref 0.0–0.2)

## 2021-07-28 LAB — COMPREHENSIVE METABOLIC PANEL
ALT: 28 U/L (ref 0–44)
AST: 28 U/L (ref 15–41)
Albumin: 3.5 g/dL (ref 3.5–5.0)
Alkaline Phosphatase: 72 U/L (ref 38–126)
Anion gap: 6 (ref 5–15)
BUN: 9 mg/dL (ref 6–20)
CO2: 26 mmol/L (ref 22–32)
Calcium: 8.7 mg/dL — ABNORMAL LOW (ref 8.9–10.3)
Chloride: 106 mmol/L (ref 98–111)
Creatinine, Ser: 0.84 mg/dL (ref 0.44–1.00)
GFR, Estimated: >60 mL/min (ref >60)
Glucose, Bld: 94 mg/dL (ref 70–99)
Potassium: 3.9 mmol/L (ref 3.5–5.1)
Sodium: 138 mmol/L (ref 135–145)
Total Bilirubin: 0.7 mg/dL (ref 0.3–1.2)
Total Protein: 7.1 g/dL (ref 6.5–8.1)

## 2021-07-28 LAB — LIPASE, BLOOD: Lipase: 26 U/L (ref 11–51)

## 2021-07-28 MED ORDER — SODIUM CHLORIDE 0.9 % IV BOLUS
1000.0000 mL | Freq: Once | INTRAVENOUS | Status: AC
  Administered 2021-07-28: 1000 mL via INTRAVENOUS

## 2021-07-28 MED ORDER — LEVOFLOXACIN 500 MG PO TABS: 500.0000 mg | ORAL_TABLET | Freq: Every day | ORAL | 0 refills | Status: DC

## 2021-07-28 MED ORDER — ONDANSETRON HCL 4 MG/2ML IJ SOLN
4.0000 mg | Freq: Once | INTRAMUSCULAR | Status: AC
  Administered 2021-07-28: 4 mg via INTRAVENOUS
  Filled 2021-07-28: qty 2

## 2021-07-28 MED ORDER — HYDROMORPHONE HCL 1 MG/ML IJ SOLN
0.5000 mg | Freq: Once | INTRAMUSCULAR | Status: AC
  Administered 2021-07-28: 0.5 mg via INTRAVENOUS
  Filled 2021-07-28: qty 1

## 2021-07-28 MED ORDER — OXYCODONE-ACETAMINOPHEN 5-325 MG PO TABS: 1.0000 | ORAL_TABLET | Freq: Four times a day (QID) | ORAL | 0 refills | Status: DC | PRN

## 2021-07-28 MED ORDER — PANTOPRAZOLE SODIUM 40 MG IV SOLR
40.0000 mg | Freq: Once | INTRAVENOUS | Status: AC
Start: 2021-07-28 — End: 2021-07-28
  Administered 2021-07-28: 40 mg via INTRAVENOUS
  Filled 2021-07-28: qty 10

## 2021-07-28 MED ORDER — ONDANSETRON 4 MG PO TBDP: ORAL_TABLET | ORAL | 0 refills | Status: DC

## 2021-07-28 NOTE — ED Triage Notes (Signed)
Pt c/o mid abdominal pain & cramping, nausea, mid chest pain, SOB, headache, nasal congestion since Friday.  ?

## 2021-07-28 NOTE — ED Provider Notes (Signed)
?[facility] ?Provider Note ? ? ?CSN: [id number] ?Arrival date & time: 07/28/21  0941 ? ?  ? ?History ? ?Chief Complaint  ?Patient presents with  ? Abdominal Pain  ? ? ?Virginia Trujillo is a 56 y.o. female. ? ?Patient complains of sinus congestion and abdominal pain.  Past medical history obesity ? ?The history is provided by the patient and medical records. No language interpreter was used.  ?Abdominal Pain ?Pain location:  Generalized ?Pain quality: aching   ?Pain radiates to:  Does not radiate ?Pain severity:  Moderate ?Onset quality:  Sudden ?Timing:  Constant ?Progression:  Waxing and waning ?Chronicity:  New ?Context: not alcohol use   ?Relieved by:  Nothing ?Worsened by:  Nothing ?Associated symptoms: no chest pain, no cough, no diarrhea, no fatigue and no hematuria   ? ?  ? ?Home Medications ?Prior to Admission medications   ?Medication Sig Start Date End Date Taking? Authorizing Provider  ?levofloxacin (LEVAQUIN) 500 MG tablet Take 1 tablet (500 mg total) by mouth daily. 07/28/21  Yes Milton Ferguson, MD  ?ondansetron (ZOFRAN-ODT) 4 MG disintegrating tablet 4mg  ODT q4 hours prn nausea/vomit 07/28/21  Yes Milton Ferguson, MD  ?oxyCODONE-acetaminophen (PERCOCET/ROXICET) 5-325 MG tablet Take 1 tablet by mouth every 6 (six) hours as needed for severe pain. 07/28/21  Yes Milton Ferguson, MD  ?Acetaminophen (TYLENOL 8 HOUR PO) Take by mouth.      [provider]  ?albuterol (PROVENTIL) (2.5 MG/3ML) 0.083% nebulizer solution Take 2.5 mg by nebulization every 6 (six) hours as needed for wheezing or shortness of breath.    [provider]  ?ALPRAZolam Duanne Moron) 1 MG tablet Take 1 tablet (1 mg total) by mouth 3 (three) times daily as needed for anxiety. 06/03/21 06/03/22  Cloria Spring, MD  ?ARIPiprazole (ABILIFY) 2 MG tablet Take 1 tablet (2 mg total) by mouth every morning. 06/03/21   Cloria Spring, MD  ?baclofen (LIORESAL) 10 MG tablet Take 10 mg by mouth 3 (three) times daily.     [provider]  ?Cholecalciferol (VITAMIN D PO) Take by mouth.    [provider]  ?DULoxetine (CYMBALTA) 60 MG capsule Take 1 capsule (60 mg total) by mouth 2 (two) times daily. 06/03/21   Cloria Spring, MD  ?gabapentin (NEURONTIN) 600 MG tablet daily 07/11/15   [provider]  ?HYDROcodone-acetaminophen (NORCO/VICODIN) 5-325 MG tablet Take 1 tablet by mouth every 6 (six) hours as needed for severe pain. 05/30/21   Evalee Jefferson, PA-C  ?ibuprofen (ADVIL) 600 MG tablet Take 1 tablet (600 mg total) by mouth 3 (three) times daily. 05/30/21   Evalee Jefferson, PA-C  ?montelukast (SINGULAIR) 10 MG tablet Take 10 mg by mouth at bedtime.    [provider]  ?ondansetron (ZOFRAN) 4 MG tablet Take 1 tablet (4 mg total) by mouth every 8 (eight) hours as needed for nausea or vomiting. 05/30/21   Evalee Jefferson, PA-C  ?SUMAtriptan (IMITREX) 25 MG tablet Take 25 mg by mouth every 2 (two) hours as needed for migraine. May repeat in 2 hours if headache persists or recurs.    [provider]  ?temazepam (RESTORIL) 30 MG capsule Take 1 capsule (30 mg total) by mouth at bedtime as needed for sleep. 06/03/21   Cloria Spring, MD  ?   ? ?Allergies    ?Prednisone, Ambien [zolpidem tartrate], and Penicillins   ? ?Review of Systems   ?Review of Systems  ?Constitutional:  Negative for appetite change and fatigue.  ?  HENT:  Negative for congestion, ear discharge and sinus pressure.   ?     Sinus congestion  ?Eyes:  Negative for discharge.  ?Respiratory:  Negative for cough.   ?Cardiovascular:  Negative for chest pain.  ?Gastrointestinal:  Positive for abdominal pain. Negative for diarrhea.  ?Genitourinary:  Negative for frequency and hematuria.  ?Musculoskeletal:  Negative for back pain.  ?Skin:  Negative for rash.  ?Neurological:  Negative for seizures and headaches.  ?Psychiatric/Behavioral:  Negative for hallucinations.   ? ?Physical Exam ?Updated Vital Signs ?BP (!) 157/93   Pulse 75   Temp 98.5 ?F  (36.9 ?C) (Oral)   Resp 18   Ht 5\' 3"  (1.6 m)   Wt 117.9 kg   SpO2 95%   BMI 46.06 kg/m?  ?Physical Exam ?Vitals and nursing note reviewed.  ?Constitutional:   ?   Appearance: She is well-developed.  ?HENT:  ?   Head: Normocephalic.  ?   Nose: Nose normal.  ?Eyes:  ?   General: No scleral icterus. ?   Conjunctiva/sclera: Conjunctivae normal.  ?Neck:  ?   Thyroid: No thyromegaly.  ?Cardiovascular:  ?   Rate and Rhythm: Normal rate and regular rhythm.  ?   Heart sounds: No murmur heard. ?  No friction rub. No gallop.  ?Pulmonary:  ?   Breath sounds: No stridor. No wheezing or rales.  ?Chest:  ?   Chest wall: No tenderness.  ?Abdominal:  ?   General: There is no distension.  ?   Tenderness: There is abdominal tenderness. There is no rebound.  ?Musculoskeletal:     ?   General: Normal range of motion.  ?   Cervical back: Neck supple.  ?Lymphadenopathy:  ?   Cervical: No cervical adenopathy.  ?Skin: ?   Findings: No erythema or rash.  ?Neurological:  ?   Mental Status: She is alert and oriented to person, place, and time.  ?   Motor: No abnormal muscle tone.  ?   Coordination: Coordination normal.  ?Psychiatric:     ?   Behavior: Behavior normal.  ? ? ?ED Results / Procedures / Treatments   ?Labs ?(all labs ordered are listed, but only abnormal results are displayed) ?Labs Reviewed  ?CBC WITH DIFFERENTIAL/PLATELET - Abnormal; Notable for the following components:  ?    Result Value  ? RBC 3.68 (*)   ? All other components within normal limits  ?COMPREHENSIVE METABOLIC PANEL - Abnormal; Notable for the following components:  ? Calcium 8.7 (*)   ? All other components within normal limits  ?LIPASE, BLOOD  ? ? ?EKG ?None ? ?Radiology ?US Abdomen Complete ? ?Result Date: 07/28/2021 ?CLINICAL DATA:  56 year old female with history of abdominal pain for the past 2 days with nausea. EXAM: ABDOMEN ULTRASOUND COMPLETE COMPARISON:  No priors. FINDINGS: Gallbladder: Echogenic foci with posterior shadowing indicative of  gallstones, largest of which measures 1.4 cm. Gallbladder is under distended. Gallbladder wall thickness is normal at 3 mm. No pericholecystic fluid. Per report from the sonographer, the patient did exhibit a sonographic Murphy's sign on examination. Common bile duct: Diameter: 3.8 mm Liver: Diffusely echogenic, suggesting hepatic steatosis. No discrete cystic or solid hepatic lesions. Portal vein is patent on color Doppler imaging with normal direction of blood flow towards the liver. IVC: No abnormality visualized. Pancreas: Visualized portion unremarkable. Spleen: Size and appearance within normal limits. Right Kidney: Length: 11 cm. Echogenicity within normal limits. No mass or hydronephrosis visualized. Left Kidney: Length: 10.1 cm. Echogenicity  within normal limits. No mass or hydronephrosis visualized. Abdominal aorta: No aneurysm visualized. Other findings: None. IMPRESSION: 1. Study is positive for cholelithiasis. Gallbladder does not appear distended, and demonstrates normal wall thickness with no pericholecystic fluid. However, per report from the sonographer, the patient did exhibit a sonographic Murphy's sign on examination. Overall, findings are considered equivocal for acute cholecystitis. Further clinical evaluation is recommended. Follow-up nuclear medicine hepatobiliary scan could be considered if clinically appropriate. 2. Hepatic steatosis. Electronically Signed   By: Vinnie Langton M.D.   On: 07/28/2021 11:58   ? ?Procedures ?Procedures  ? ? ?Medications Ordered in ED ?Medications  ?sodium chloride 0.9 % bolus 1,000 mL (1,000 mLs Intravenous New Bag/Given 07/28/21 1250)  ?HYDROmorphone (DILAUDID) injection 0.5 mg (0.5 mg Intravenous Given 07/28/21 1251)  ?ondansetron (ZOFRAN) injection 4 mg (4 mg Intravenous Given 07/28/21 1250)  ?pantoprazole (PROTONIX) injection 40 mg (40 mg Intravenous Given 07/28/21 1251)  ? ? ?ED Course/ Medical Decision Making/ A&P ?  ?                        ?Medical  Decision Making ?Amount and/or Complexity of Data Reviewed ?Labs: ordered. ?Radiology: ordered. ? ?Risk ?Prescription drug management. ? ?This patient presents to the ED for concern of sinus infection abdominal pain, this involve

## 2021-07-28 NOTE — Discharge Instructions (Signed)
Follow-up with Dr. Robyne Peers this week,   return if problems ?

## 2021-08-01 ENCOUNTER — Encounter: Payer: Self-pay | Admitting: Surgery

## 2021-08-01 ENCOUNTER — Other Ambulatory Visit: Payer: Self-pay

## 2021-08-01 ENCOUNTER — Ambulatory Visit: Payer: BC Managed Care – PPO | Admitting: Surgery

## 2021-08-01 VITALS — BP 144/94 | HR 73 | Temp 97.7°F | Resp 16 | Ht 63.0 in | Wt 256.0 lb

## 2021-08-01 DIAGNOSIS — K8020 Calculus of gallbladder without cholecystitis without obstruction: Secondary | ICD-10-CM | POA: Diagnosis not present

## 2021-08-01 NOTE — Progress Notes (Signed)
[facility] History and Physical ? ?Reason for Referral: Cholelithiasis ?Referring Physician: Milton Ferguson, MD ? ?Chief Complaint   ?New Patient (Initial Visit) ?  ? ? ?Virginia Trujillo is a 56 y.o. female.  ?HPI: Patient presents for evaluation of her cholelithiasis.  She initially presented to the ED last week for sinus issues, but also stated that she had epigastric and right upper quadrant pains.  At that time she was evaluated for cholecystitis.  Her blood work was noted to be within normal limits, and she had no leukocytosis.  She also underwent an abdominal ultrasound which demonstrated cholelithiasis without wall thickening or pericholecystic fluid.  She had a positive Murphy's sign by the sonographer.  She was discharged home and advised to follow-up with me.  She states that she has been having pain after eating for about 1 month.  She usually attributes the pain in her epigastrium and right upper quadrant to be secondary to her chronic back pain issues.  She confirms nausea with episodes of emesis.  Her last episode of emesis was yesterday when she ate a barbecue sandwich.  She has been moving her bowels, though states that she has been constipated since taking the pain medications that she was discharged from the ED with.  Her last bowel movement was this morning.  Her surgical history significant for C-section.  Her past medical history significant for depression, GERD, and sinus issues.  She denies use of blood thinners.  She denies use of tobacco products, alcohol, and illicit drugs. ? ?Past Medical History:  ?Diagnosis Date  ? Acid reflux   ? Anxiety   ? Depression   ? Fibromyalgia   ? ? ?Past Surgical History:  ?Procedure Laterality Date  ? CESAREAN SECTION    ? MOUTH SURGERY    ? tubes tied    ? ? ?Family History  ?Problem Relation Age of Onset  ? Hypertension Father   ? Hypertension Brother   ? Diabetes Brother   ? Depression Brother   ? Anxiety disorder Brother   ? Stroke  Maternal Grandmother   ? Stroke Paternal Grandfather   ? Congestive Heart Failure Sister   ? Diabetes Sister   ? Hypertension Sister   ? Anxiety disorder Sister   ? Depression Sister   ? Heart disease Unknown   ? Arthritis Unknown   ? Cancer Unknown   ? Asthma Unknown   ? Diabetes Unknown   ? Kidney disease Unknown   ? ? ?Social History  ? ?Tobacco Use  ? Smoking status: Never  ? Smokeless tobacco: Never  ?Vaping Use  ? Vaping Use: Never used  ?Substance Use Topics  ? Alcohol use: No  ? Drug use: No  ? ? ?Medications: I have reviewed the patient's current medications. ?Allergies as of 08/01/2021   ? ?   Reactions  ? Prednisone Other (See Comments)  ? Depression and crying  ? Ambien [zolpidem Tartrate] Other (See Comments)  ? hallucinations  ? Penicillins   ? ?  ? ?  ?Medication List  ?  ? ?  ? Accurate as of August 01, 2021 10:39 AM. If you have any questions, ask your nurse or doctor.  ?  ?  ? ?  ? ?STOP taking these medications   ? ?HYDROcodone-acetaminophen 5-325 MG tablet ?Commonly known as: NORCO/VICODIN ?Stopped by: Rusty Aus, DO ?  ?montelukast 10 MG tablet ?Commonly known as: SINGULAIR ?Stopped by: Rusty Aus, DO ?  ?oxyCODONE-acetaminophen 5-325 MG  tablet ?Commonly known as: PERCOCET/ROXICET ?Stopped by: Rusty Aus, DO ?  ?SUMAtriptan 25 MG tablet ?Commonly known as: IMITREX ?Stopped by: Rusty Aus, DO ?  ? ?  ? ?TAKE these medications   ? ?albuterol (2.5 MG/3ML) 0.083% nebulizer solution ?Commonly known as: PROVENTIL ?Take 2.5 mg by nebulization every 6 (six) hours as needed for wheezing or shortness of breath. ?  ?ALPRAZolam 1 MG tablet ?Commonly known as: Xanax ?Take 1 tablet (1 mg total) by mouth 3 (three) times daily as needed for anxiety. ?  ?ARIPiprazole 2 MG tablet ?Commonly known as: Abilify ?Take 1 tablet (2 mg total) by mouth every morning. ?  ?baclofen 10 MG tablet ?Commonly known as: LIORESAL ?Take 10 mg by mouth 3 (three) times daily. ?   ?DULoxetine 60 MG capsule ?Commonly known as: CYMBALTA ?Take 1 capsule (60 mg total) by mouth 2 (two) times daily. ?  ?gabapentin 600 MG tablet ?Commonly known as: NEURONTIN ?daily ?  ?ibuprofen 600 MG tablet ?Commonly known as: ADVIL ?Take 1 tablet (600 mg total) by mouth 3 (three) times daily. ?  ?levofloxacin 500 MG tablet ?Commonly known as: LEVAQUIN ?Take 1 tablet (500 mg total) by mouth daily. ?  ?ondansetron 4 MG disintegrating tablet ?Commonly known as: ZOFRAN-ODT ?4mg  ODT q4 hours prn nausea/vomit ?  ?ondansetron 4 MG tablet ?Commonly known as: ZOFRAN ?Take 1 tablet (4 mg total) by mouth every 8 (eight) hours as needed for nausea or vomiting. ?  ?temazepam 30 MG capsule ?Commonly known as: RESTORIL ?Take 1 capsule (30 mg total) by mouth at bedtime as needed for sleep. ?  ?TYLENOL 8 HOUR PO ?Take by mouth. ?  ?VITAMIN D PO ?Take by mouth. ?  ? ?  ? ? ? ?ROS:  ?Constitutional: negative for chills, fatigue, and fevers ?Eyes: negative for visual disturbance and pain ?Ears, nose, mouth, throat, and face: positive for sinus problems, negative for ear drainage and sore throat ?Respiratory: positive for wheezing and shortness of breath, negative for cough ?Cardiovascular: positive for chest pain ?Gastrointestinal: positive for abdominal pain, nausea, and reflux symptoms ?Genitourinary:negative for dysuria, frequency, and urinary retention ?Integument/breast: positive for dryness, negative for rash ?Hematologic/lymphatic: negative for bleeding and lymphadenopathy ?Musculoskeletal:positive for back pain, neck pain, and joint pain ?Neurological: negative for dizziness, tremors, and numbness ?Endocrine: negative for temperature intolerance ? ?Blood pressure (!) 144/94, pulse 73, temperature 97.7 ?F (36.5 ?C), temperature source Other (Comment), resp. rate 16, height 5\' 3"  (1.6 m), weight 256 lb (116.1 kg), SpO2 98 %. ?Physical Exam ?Vitals reviewed.  ?Constitutional:   ?   Appearance: Normal appearance.  ?HENT:  ?    Head: Normocephalic and atraumatic.  ?Eyes:  ?   Extraocular Movements: Extraocular movements intact.  ?   Pupils: Pupils are equal, round, and reactive to light.  ?Cardiovascular:  ?   Rate and Rhythm: Normal rate and regular rhythm.  ?Pulmonary:  ?   Breath sounds: Normal breath sounds.  ?Abdominal:  ?   Comments: Abdomen soft, nondistended, no percussion tenderness, mild tenderness to palpation lower abdomen, no rigidity, guarding, rebound tenderness, negative Murphy's sign  ?Musculoskeletal:     ?   General: Normal range of motion.  ?   Cervical back: Normal range of motion.  ?Skin: ?   General: Skin is warm and dry.  ?Neurological:  ?   General: No focal deficit present.  ?   Mental Status: She is alert and oriented to person, place, and time.  ?Psychiatric:     ?  Mood and Affect: Mood normal.     ?   Behavior: Behavior normal.  ? ? ?Results: ?Recent Results (from the past 2160 hour(s))  ?CBC with Differential/Platelet     Status: Abnormal  ? Collection Time: 07/28/21 12:33 PM  ?Result Value Ref Range  ? WBC 7.1 4.0 - 10.5 K/uL  ? RBC 3.68 (L) 3.87 - 5.11 MIL/uL  ? Hemoglobin 12.0 12.0 - 15.0 g/dL  ? HCT 36.7 36.0 - 46.0 %  ? MCV 99.7 80.0 - 100.0 fL  ? MCH 32.6 26.0 - 34.0 pg  ? MCHC 32.7 30.0 - 36.0 g/dL  ? RDW 12.2 11.5 - 15.5 %  ? Platelets 164 150 - 400 K/uL  ? nRBC 0.0 0.0 - 0.2 %  ? Neutrophils Relative % 72 %  ? Neutro Abs 5.1 1.7 - 7.7 K/uL  ? Lymphocytes Relative 21 %  ? Lymphs Abs 1.5 0.7 - 4.0 K/uL  ? Monocytes Relative 6 %  ? Monocytes Absolute 0.4 0.1 - 1.0 K/uL  ? Eosinophils Relative 1 %  ? Eosinophils Absolute 0.1 0.0 - 0.5 K/uL  ? Basophils Relative 0 %  ? Basophils Absolute 0.0 0.0 - 0.1 K/uL  ? Immature Granulocytes 0 %  ? Abs Immature Granulocytes 0.02 0.00 - 0.07 K/uL  ?  Comment: Performed at [facility], [address], [place], [place] [address]  ?Comprehensive metabolic panel     Status: Abnormal  ? Collection Time: 07/28/21 12:33 PM  ?Result Value Ref Range  ? Sodium 138 135 - 145  mmol/L  ? Potassium 3.9 3.5 - 5.1 mmol/L  ? Chloride 106 98 - 111 mmol/L  ? CO2 26 22 - 32 mmol/L  ? Glucose, Bld 94 70 - 99 mg/dL  ?  Comment: Glucose reference range applies only to samples taken after fasting for at

## 2021-08-02 ENCOUNTER — Telehealth (INDEPENDENT_AMBULATORY_CARE_PROVIDER_SITE_OTHER): Payer: BC Managed Care – PPO | Admitting: Psychiatry

## 2021-08-02 ENCOUNTER — Encounter (HOSPITAL_COMMUNITY): Payer: Self-pay | Admitting: Psychiatry

## 2021-08-02 DIAGNOSIS — K8020 Calculus of gallbladder without cholecystitis without obstruction: Secondary | ICD-10-CM

## 2021-08-02 DIAGNOSIS — F411 Generalized anxiety disorder: Secondary | ICD-10-CM | POA: Diagnosis not present

## 2021-08-02 DIAGNOSIS — F321 Major depressive disorder, single episode, moderate: Secondary | ICD-10-CM

## 2021-08-02 MED ORDER — TEMAZEPAM 30 MG PO CAPS: 30.0000 mg | ORAL_CAPSULE | Freq: Every evening | ORAL | 2 refills | Status: DC | PRN

## 2021-08-02 MED ORDER — ARIPIPRAZOLE 2 MG PO TABS: 2.0000 mg | ORAL_TABLET | ORAL | 2 refills | Status: DC

## 2021-08-02 MED ORDER — ALPRAZOLAM 1 MG PO TABS: 1.0000 mg | ORAL_TABLET | Freq: Three times a day (TID) | ORAL | 2 refills | Status: DC | PRN

## 2021-08-02 MED ORDER — DULOXETINE HCL 60 MG PO CPEP: 60.0000 mg | ORAL_CAPSULE | Freq: Two times a day (BID) | ORAL | 2 refills | Status: DC

## 2021-08-02 NOTE — H&P (Signed)
[facility] History and Physical ?  ?Reason for Referral: Cholelithiasis ?Referring Physician: Milton Ferguson, MD ?  ?Chief Complaint   ?New Patient (Initial Visit) ?   ?  ?  ?Virginia Trujillo is a 56 y.o. female.  ?HPI: Patient presents for evaluation of her cholelithiasis.  She initially presented to the ED last week for sinus issues, but also stated that she had epigastric and right upper quadrant pains.  At that time she was evaluated for cholecystitis.  Her blood work was noted to be within normal limits, and she had no leukocytosis.  She also underwent an abdominal ultrasound which demonstrated cholelithiasis without wall thickening or pericholecystic fluid.  She had a positive Murphy's sign by the sonographer.  She was discharged home and advised to follow-up with me.  She states that she has been having pain after eating for about 1 month.  She usually attributes the pain in her epigastrium and right upper quadrant to be secondary to her chronic back pain issues.  She confirms nausea with episodes of emesis.  Her last episode of emesis was yesterday when she ate a barbecue sandwich.  She has been moving her bowels, though states that she has been constipated since taking the pain medications that she was discharged from the ED with.  Her last bowel movement was this morning.  Her surgical history significant for C-section.  Her past medical history significant for depression, GERD, and sinus issues.  She denies use of blood thinners.  She denies use of tobacco products, alcohol, and illicit drugs. ?  ?    ?Past Medical History:  ?Diagnosis Date  ? Acid reflux    ? Anxiety    ? Depression    ? Fibromyalgia    ?  ?  ?     ?Past Surgical History:  ?Procedure Laterality Date  ? CESAREAN SECTION      ? MOUTH SURGERY      ? tubes tied      ?  ?  ?     ?Family History  ?Problem Relation Age of Onset  ? Hypertension Father    ? Hypertension Brother    ? Diabetes Brother    ? Depression Brother    ?  Anxiety disorder Brother    ? Stroke Maternal Grandmother    ? Stroke Paternal Grandfather    ? Congestive Heart Failure Sister    ? Diabetes Sister    ? Hypertension Sister    ? Anxiety disorder Sister    ? Depression Sister    ? Heart disease Unknown    ? Arthritis Unknown    ? Cancer Unknown    ? Asthma Unknown    ? Diabetes Unknown    ? Kidney disease Unknown    ?  ?  ?Social History  ?  ?    ?Tobacco Use  ? Smoking status: Never  ? Smokeless tobacco: Never  ?Vaping Use  ? Vaping Use: Never used  ?Substance Use Topics  ? Alcohol use: No  ? Drug use: No  ?  ?  ?Medications: I have reviewed the patient's current medications. ?Allergies as of 08/01/2021   ?  ?    Reactions  ?  Prednisone Other (See Comments)  ?  Depression and crying  ?  Ambien [zolpidem Tartrate] Other (See Comments)  ?  hallucinations  ?  Penicillins    ?  ?   ?  ?   ?Medication List  ?   ?  ?   ?  Accurate as of August 01, 2021 10:39 AM. If you have any questions, ask your nurse or doctor.  ?  ?   ?  ?   ?  ?STOP taking these medications   ?  ?HYDROcodone-acetaminophen 5-325 MG tablet ?Commonly known as: NORCO/VICODIN ?Stopped by: Lewie Chamber, DO ?   ?montelukast 10 MG tablet ?Commonly known as: SINGULAIR ?Stopped by: Lewie Chamber, DO ?   ?oxyCODONE-acetaminophen 5-325 MG tablet ?Commonly known as: PERCOCET/ROXICET ?Stopped by: Lewie Chamber, DO ?   ?SUMAtriptan 25 MG tablet ?Commonly known as: IMITREX ?Stopped by: Lewie Chamber, DO ?   ?  ?   ?  ?TAKE these medications   ?  ?albuterol (2.5 MG/3ML) 0.083% nebulizer solution ?Commonly known as: PROVENTIL ?Take 2.5 mg by nebulization every 6 (six) hours as needed for wheezing or shortness of breath. ?   ?ALPRAZolam 1 MG tablet ?Commonly known as: Xanax ?Take 1 tablet (1 mg total) by mouth 3 (three) times daily as needed for anxiety. ?   ?ARIPiprazole 2 MG tablet ?Commonly known as: Abilify ?Take 1 tablet (2 mg total) by mouth every morning. ?   ?baclofen 10 MG  tablet ?Commonly known as: LIORESAL ?Take 10 mg by mouth 3 (three) times daily. ?   ?DULoxetine 60 MG capsule ?Commonly known as: CYMBALTA ?Take 1 capsule (60 mg total) by mouth 2 (two) times daily. ?   ?gabapentin 600 MG tablet ?Commonly known as: NEURONTIN ?daily ?   ?ibuprofen 600 MG tablet ?Commonly known as: ADVIL ?Take 1 tablet (600 mg total) by mouth 3 (three) times daily. ?   ?levofloxacin 500 MG tablet ?Commonly known as: LEVAQUIN ?Take 1 tablet (500 mg total) by mouth daily. ?   ?ondansetron 4 MG disintegrating tablet ?Commonly known as: ZOFRAN-ODT ?4mg  ODT q4 hours prn nausea/vomit ?   ?ondansetron 4 MG tablet ?Commonly known as: ZOFRAN ?Take 1 tablet (4 mg total) by mouth every 8 (eight) hours as needed for nausea or vomiting. ?   ?temazepam 30 MG capsule ?Commonly known as: RESTORIL ?Take 1 capsule (30 mg total) by mouth at bedtime as needed for sleep. ?   ?TYLENOL 8 HOUR PO ?Take by mouth. ?   ?VITAMIN D PO ?Take by mouth. ?   ?  ?   ?  ?  ?  ?ROS:  ?Constitutional: negative for chills, fatigue, and fevers ?Eyes: negative for visual disturbance and pain ?Ears, nose, mouth, throat, and face: positive for sinus problems, negative for ear drainage and sore throat ?Respiratory: positive for wheezing and shortness of breath, negative for cough ?Cardiovascular: positive for chest pain ?Gastrointestinal: positive for abdominal pain, nausea, and reflux symptoms ?Genitourinary:negative for dysuria, frequency, and urinary retention ?Integument/breast: positive for dryness, negative for rash ?Hematologic/lymphatic: negative for bleeding and lymphadenopathy ?Musculoskeletal:positive for back pain, neck pain, and joint pain ?Neurological: negative for dizziness, tremors, and numbness ?Endocrine: negative for temperature intolerance ?  ?Blood pressure (!) 144/94, pulse 73, temperature 97.7 ?F (36.5 ?C), temperature source Other (Comment), resp. rate 16, height 5\' 3"  (1.6 m), weight 256 lb (116.1 kg), SpO2 98  %. ?Physical Exam ?Vitals reviewed.  ?Constitutional:   ?   Appearance: Normal appearance.  ?HENT:  ?   Head: Normocephalic and atraumatic.  ?Eyes:  ?   Extraocular Movements: Extraocular movements intact.  ?   Pupils: Pupils are equal, round, and reactive to light.  ?Cardiovascular:  ?   Rate and Rhythm: Normal rate and regular rhythm.  ?Pulmonary:  ?  Breath sounds: Normal breath sounds.  ?Abdominal:  ?   Comments: Abdomen soft, nondistended, no percussion tenderness, mild tenderness to palpation lower abdomen, no rigidity, guarding, rebound tenderness, negative Murphy's sign  ?Musculoskeletal:     ?   General: Normal range of motion.  ?   Cervical back: Normal range of motion.  ?Skin: ?   General: Skin is warm and dry.  ?Neurological:  ?   General: No focal deficit present.  ?   Mental Status: She is alert and oriented to person, place, and time.  ?Psychiatric:     ?   Mood and Affect: Mood normal.     ?   Behavior: Behavior normal.  ?  ?  ?Results: ?      ?Recent Results (from the past 2160 hour(s))  ?CBC with Differential/Platelet     Status: Abnormal  ?  Collection Time: 07/28/21 12:33 PM  ?Result Value Ref Range  ?  WBC 7.1 4.0 - 10.5 K/uL  ?  RBC 3.68 (L) 3.87 - 5.11 MIL/uL  ?  Hemoglobin 12.0 12.0 - 15.0 g/dL  ?  HCT 36.7 36.0 - 46.0 %  ?  MCV 99.7 80.0 - 100.0 fL  ?  MCH 32.6 26.0 - 34.0 pg  ?  MCHC 32.7 30.0 - 36.0 g/dL  ?  RDW 12.2 11.5 - 15.5 %  ?  Platelets 164 150 - 400 K/uL  ?  nRBC 0.0 0.0 - 0.2 %  ?  Neutrophils Relative % 72 %  ?  Neutro Abs 5.1 1.7 - 7.7 K/uL  ?  Lymphocytes Relative 21 %  ?  Lymphs Abs 1.5 0.7 - 4.0 K/uL  ?  Monocytes Relative 6 %  ?  Monocytes Absolute 0.4 0.1 - 1.0 K/uL  ?  Eosinophils Relative 1 %  ?  Eosinophils Absolute 0.1 0.0 - 0.5 K/uL  ?  Basophils Relative 0 %  ?  Basophils Absolute 0.0 0.0 - 0.1 K/uL  ?  Immature Granulocytes 0 %  ?  Abs Immature Granulocytes 0.02 0.00 - 0.07 K/uL  ?    Comment: Performed at [facility], [address], [place], [place] [address]   ?Comprehensive metabolic panel     Status: Abnormal  ?  Collection Time: 07/28/21 12:33 PM  ?Result Value Ref Range  ?  Sodium 138 135 - 145 mmol/L  ?  Potassium 3.9 3.5 - 5.1 mmol/L  ?  Chloride 106 98 - 111 mm

## 2021-08-02 NOTE — Progress Notes (Signed)
BH MD/PA/NP OP Progress Note ? ?08/02/2021 9:36 AM ?[name]  ?MRN:  [id number] ? ?Chief Complaint:  ?Chief Complaint  ?Patient presents with  ? Anxiety  ? Depression  ? Follow-up  ? ?HPI: This patient is a 56 year old married black female who lives with her husband in [place].  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 ? ?The patient returns for follow-up after 2 months.  She has had a lot of difficult things occur recently.  She went to the emergency room a few days ago because of abdominal pain.  She is found to have gallbladder stones and is going to have to have her gallbladder out April 12.  Also yesterday the rest home where her brother resided called her and stated that he died due to aspiration.  She is very upset about all of this and also feels very sick.  For the most part she does think the medications help her although she is not so sure about the Cymbalta.  I explained to her that she was doing fairly well until all of this happened and that perhaps we need to give it a little bit more time and she agrees.  She denies any thoughts of self-harm or suicidal ideation. ?Visit Diagnosis:  ?  ICD-10-CM   ?1. Generalized anxiety disorder  F41.1   ?  ?2. 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 episode (HCC)  F32.1   ?  ? ? ?Past Psychiatric History: none ? ?Past Medical History:  ?Past Medical History:  ?Diagnosis Date  ? Acid reflux   ? Anxiety   ? Depression   ? Fibromyalgia   ?  ?Past Surgical History:  ?Procedure Laterality Date  ? CESAREAN SECTION    ? MOUTH SURGERY    ? tubes tied    ? ? ?Family Psychiatric History: see below ? ?Family History:  ?Family History  ?Problem Relation Age of Onset  ? Hypertension Father   ? Hypertension Brother   ? Diabetes Brother   ? Depression Brother   ? Anxiety disorder Brother   ? Stroke Maternal Grandmother   ? Stroke Paternal Grandfather   ? Congestive Heart Failure Sister    ? Diabetes Sister   ? Hypertension Sister   ? Anxiety disorder Sister   ? Depression Sister   ? Heart disease Unknown   ? Arthritis Unknown   ? Cancer Unknown   ? Asthma Unknown   ? Diabetes Unknown   ? Kidney disease Unknown   ? ? ?Social History:  ?Social History  ? ?Socioeconomic History  ? Marital status: Married  ?  Spouse name: Not on file  ? Number of children: 2  ? Years of education: 41  ? Highest education level: Not on file  ?Occupational History  ? Occupation: none  ?  Employer: UNEMPLOYED  ? Occupation: Unemployed   ?Tobacco Use  ? Smoking status: Never  ? Smokeless tobacco: Never  ?Vaping Use  ? Vaping Use: Never used  ?Substance and Sexual Activity  ? Alcohol use: No  ? Drug use: No  ? Sexual activity: Yes  ?  Birth control/protection: Surgical  ?Other Topics Concern  ? Not on file  ?Social History Narrative  ? Patient lives at home with husband and son.   ? Patient does not work  ? Patient has a high school education   ? Patient has 2 children.  ? ?  Social Determinants of Health  ? ?Financial Resource Strain: Not on file  ?Food Insecurity: Not on file  ?Transportation Needs: Not on file  ?Physical Activity: Not on file  ?Stress: Not on file  ?Social Connections: Not on file  ? ? ?Allergies:  ?Allergies  ?Allergen Reactions  ? Prednisone Other (See Comments)  ?  Depression and crying  ? Ambien [Zolpidem Tartrate] Other (See Comments)  ?  hallucinations  ? Penicillins   ? ? ?Metabolic Disorder Labs: ?No results found for: HGBA1C, MPG ?No results found for: PROLACTIN ?No results found for: CHOL, TRIG, HDL, CHOLHDL, VLDL, LDLCALC ?Lab Results  ?Component Value Date  ? TSH 3.190 11/29/2013  ? ? ?Therapeutic Level Labs: ?No results found for: LITHIUM ?No results found for: VALPROATE ?No components found for:  CBMZ ? ?Current Medications: ?Current Outpatient Medications  ?Medication Sig Dispense Refill  ? Acetaminophen (TYLENOL 8 HOUR PO) Take by mouth.      ? albuterol (PROVENTIL) (2.5 MG/3ML) 0.083%  nebulizer solution Take 2.5 mg by nebulization every 6 (six) hours as needed for wheezing or shortness of breath.    ? ALPRAZolam (XANAX) 1 MG tablet Take 1 tablet (1 mg total) by mouth 3 (three) times daily as needed for anxiety. 90 tablet 2  ? ARIPiprazole (ABILIFY) 2 MG tablet Take 1 tablet (2 mg total) by mouth every morning. 30 tablet 2  ? baclofen (LIORESAL) 10 MG tablet Take 10 mg by mouth 3 (three) times daily.    ? Cholecalciferol (VITAMIN D PO) Take by mouth.    ? DULoxetine (CYMBALTA) 60 MG capsule Take 1 capsule (60 mg total) by mouth 2 (two) times daily. 60 capsule 2  ? gabapentin (NEURONTIN) 600 MG tablet daily  1  ? ibuprofen (ADVIL) 600 MG tablet Take 1 tablet (600 mg total) by mouth 3 (three) times daily. 15 tablet 0  ? levofloxacin (LEVAQUIN) 500 MG tablet Take 1 tablet (500 mg total) by mouth daily. 7 tablet 0  ? ondansetron (ZOFRAN) 4 MG tablet Take 1 tablet (4 mg total) by mouth every 8 (eight) hours as needed for nausea or vomiting. 12 tablet 0  ? ondansetron (ZOFRAN-ODT) 4 MG disintegrating tablet 4mg  ODT q4 hours prn nausea/vomit 10 tablet 0  ? temazepam (RESTORIL) 30 MG capsule Take 1 capsule (30 mg total) by mouth at bedtime as needed for sleep. 30 capsule 2  ? ?No current facility-administered medications for this visit.  ? ? ? ?Musculoskeletal: ?Strength & Muscle Tone: na ?Gait & Station: na ?Patient leans: N/A ? ?Psychiatric Specialty Exam: ?Review of Systems  ?HENT:  Positive for congestion.   ?Gastrointestinal:  Positive for abdominal pain.  ?Psychiatric/Behavioral:  The patient is nervous/anxious.   ?All other systems reviewed and are negative.  ?There were no vitals taken for this visit.There is no height or weight on file to calculate BMI.  ?General Appearance: NA  ?Eye Contact:  NA  ?Speech:  Clear and Coherent  ?Volume:  Normal  ?Mood:  Anxious and Dysphoric  ?Affect:  NA  ?Thought Process:  Goal Directed  ?Orientation:  Full (Time, Place, and Person)  ?Thought Content: WDL    ?Suicidal Thoughts:  No  ?Homicidal Thoughts:  No  ?Memory:  Immediate;   Good ?Recent;   Good ?Remote;   Fair  ?Judgement:  Good  ?Insight:  Fair  ?Psychomotor Activity:  Decreased  ?Concentration:  Concentration: Fair and Attention Span: Fair  ?Recall:  Good  ?Fund of Knowledge: Fair  ?Language: Good  ?  Akathisia:  No  ?Handed:  Right  ?AIMS (if indicated): not done  ?Assets:  Communication Skills ?Desire for Improvement ?Resilience ?Social Support  ?ADL's:  Intact  ?Cognition: WNL  ?Sleep:  Fair  ? ?Screenings: ?GAD-7   ? ?Flowsheet Row Counselor from 11/10/2017 [facility]  ?Total GAD-7 Score 19  ? ?  ? ?PHQ2-9   ? ?Flowsheet Row Video Visit from 08/02/2021 in [facility] Video Visit from 06/03/2021 in [facility] Video Visit from 04/03/2021 in [facility] [facility] [facility]  ?PHQ-2 Total Score 2 0 0 0 1  ?PHQ-9 Total Score 6 -- -- -- --  ? ?  ? ?Flowsheet Row Video Visit from 08/02/2021 [facility] [facility]  ?C-SSRS RISK CATEGORY No Risk No Risk No Risk  ? ?  ? ? ? ?Assessment and Plan: This patient is a 56 year old female with a history of depression and anxiety.  She has had a lot of negative events happen recently including getting diagnosed with gallbladder stones and anticipating surgery as well as learning of her brother's death yesterday.  I do not think this is an appropriate time to change medications and she is scheduled for therapy.  She denies thoughts of self-harm or suicide.  She will continue Cymbalta 60 mg  twice daily for depression, Abilify 2 mg daily for augmentation Xanax 1 mg 3 times daily for anxiety and temazepam 30 mg at bedtime as needed for sleep ? ?Collaboration of Care: Collaboration of Care: Referral or

## 2021-08-05 ENCOUNTER — Emergency Department (HOSPITAL_COMMUNITY): Payer: BC Managed Care – PPO

## 2021-08-05 ENCOUNTER — Encounter (HOSPITAL_COMMUNITY): Payer: Self-pay | Admitting: *Deleted

## 2021-08-05 ENCOUNTER — Emergency Department (HOSPITAL_COMMUNITY)
Admission: EM | Admit: 2021-08-05 | Discharge: 2021-08-05 | Disposition: A | Discharge status: Home / Self Care | Payer: BC Managed Care – PPO | Attending: Emergency Medicine | Admitting: Emergency Medicine

## 2021-08-05 ENCOUNTER — Telehealth (HOSPITAL_COMMUNITY): Payer: Self-pay | Admitting: Psychiatry

## 2021-08-05 ENCOUNTER — Other Ambulatory Visit: Payer: Self-pay

## 2021-08-05 ENCOUNTER — Ambulatory Visit (HOSPITAL_COMMUNITY): Payer: BC Managed Care – PPO | Admitting: Psychiatry

## 2021-08-05 DIAGNOSIS — R112 Nausea with vomiting, unspecified: Secondary | ICD-10-CM | POA: Insufficient documentation

## 2021-08-05 DIAGNOSIS — R0789 Other chest pain: Secondary | ICD-10-CM | POA: Diagnosis not present

## 2021-08-05 DIAGNOSIS — R079 Chest pain, unspecified: Secondary | ICD-10-CM | POA: Diagnosis not present

## 2021-08-05 LAB — BASIC METABOLIC PANEL
Anion gap: 8 (ref 5–15)
BUN: 10 mg/dL (ref 6–20)
CO2: 23 mmol/L (ref 22–32)
Calcium: 8.7 mg/dL — ABNORMAL LOW (ref 8.9–10.3)
Chloride: 109 mmol/L (ref 98–111)
Creatinine, Ser: 0.91 mg/dL (ref 0.44–1.00)
GFR, Estimated: >60 mL/min (ref >60)
Glucose, Bld: 108 mg/dL — ABNORMAL HIGH (ref 70–99)
Potassium: 4 mmol/L (ref 3.5–5.1)
Sodium: 140 mmol/L (ref 135–145)

## 2021-08-05 LAB — CBC
HCT: 38.5 % (ref 36.0–46.0)
Hemoglobin: 12.1 g/dL (ref 12.0–15.0)
MCH: 31.7 pg (ref 26.0–34.0)
MCHC: 31.4 g/dL (ref 30.0–36.0)
MCV: 100.8 fL — ABNORMAL HIGH (ref 80.0–100.0)
Platelets: 150 10*3/uL (ref 150–400)
RBC: 3.82 MIL/uL — ABNORMAL LOW (ref 3.87–5.11)
RDW: 12.4 % (ref 11.5–15.5)
WBC: 8.1 10*3/uL (ref 4.0–10.5)
nRBC: 0 % (ref 0.0–0.2)

## 2021-08-05 LAB — TROPONIN I (HIGH SENSITIVITY): Troponin I (High Sensitivity): <2 ng/L (ref <18)

## 2021-08-05 MED ORDER — LORAZEPAM 2 MG/ML IJ SOLN
1.0000 mg | Freq: Once | INTRAMUSCULAR | Status: AC
  Administered 2021-08-05: 1 mg via INTRAMUSCULAR
  Filled 2021-08-05: qty 1

## 2021-08-05 MED ORDER — ONDANSETRON 4 MG PO TBDP
4.0000 mg | ORAL_TABLET | Freq: Once | ORAL | Status: AC
Start: 2021-08-05 — End: 2021-08-05
  Administered 2021-08-05: 4 mg via ORAL
  Filled 2021-08-05: qty 1

## 2021-08-05 NOTE — Telephone Encounter (Signed)
Therapist attempted to contact patient via phone for scheduled appointment, received voice recording.  Therapist left message indicating attempt and requesting patient call office. ?

## 2021-08-05 NOTE — Discharge Instructions (Signed)
Your work-up is reassuring.  I think the chest pain is probably from anxiety.  You should start to feel better, follow-up with your primary in the next week for reevaluation.  Return to the ED if things change or worsen. ?

## 2021-08-05 NOTE — ED Triage Notes (Signed)
States she is scheduled to have her gallbladder removed on the 12th of April, states she just left her brothers funeral and is having some chest pain at this time ?

## 2021-08-05 NOTE — ED Provider Notes (Signed)
?[facility] ?Provider Note ? ? ?CSN: [id number] ?Arrival date & time: 08/05/21  1623 ? ?  ? ?History ? ?Chief Complaint  ?Patient presents with  ? Chest Pain  ? ? ?Virginia Trujillo is a 56 y.o. female. ? ? ?Chest Pain ? ?Patient is a 56 year old female with history of anxiety, GERD and fibromyalgia presenting with chest pain.  Chest pain started while she was at a funeral earlier today, feels like a fluttering in her chest.  Her right arm feels tight, symptoms are constant.  Moving seems to make the pain worse, there is associated nausea and one episode of emesis.  She has not tried anything for the symptoms yet. ? ?Does not smoke cigarettes, no history of diabetes, hypertension or hyperlipidemia.  No prior MI or PE. ? ?Status post tubal ligation. ? ?Home Medications ?Prior to Admission medications   ?Medication Sig Start Date End Date Taking? Authorizing Provider  ?Acetaminophen (TYLENOL 8 HOUR PO) Take by mouth.     Yes [provider]  ?albuterol (PROVENTIL) (2.5 MG/3ML) 0.083% nebulizer solution Take 2.5 mg by nebulization every 6 (six) hours as needed for wheezing or shortness of breath.   Yes [provider]  ?albuterol (VENTOLIN HFA) 108 (90 Base) MCG/ACT inhaler Inhale 2 puffs into the lungs every 4 (four) hours as needed. 06/06/21  Yes [provider]  ?ALPRAZolam Prudy Feeler) 1 MG tablet Take 1 tablet (1 mg total) by mouth 3 (three) times daily as needed for anxiety. 08/02/21 08/02/22 Yes Myrlene Broker, MD  ?baclofen (LIORESAL) 10 MG tablet Take 10 mg by mouth 3 (three) times daily.   Yes [provider]  ?DULoxetine (CYMBALTA) 60 MG capsule Take 1 capsule (60 mg total) by mouth 2 (two) times daily. 08/02/21  Yes Myrlene Broker, MD  ?gabapentin (NEURONTIN) 600 MG tablet Take 600 mg by mouth daily as needed (pain). 07/11/15  Yes [provider]  ?montelukast (SINGULAIR) 10 MG tablet Take 10 mg by mouth daily. 08/02/21  Yes [provider]   ?ondansetron (ZOFRAN) 4 MG tablet Take 1 tablet (4 mg total) by mouth every 8 (eight) hours as needed for nausea or vomiting. 05/30/21  Yes Idol, Raynelle Fanning, PA-C  ?oxyCODONE-acetaminophen (PERCOCET/ROXICET) 5-325 MG tablet Take 1 tablet by mouth every 6 (six) hours as needed for severe pain.   Yes [provider]  ?pantoprazole (PROTONIX) 40 MG tablet Take 40 mg by mouth daily. 08/02/21  Yes [provider]  ?Vitamin D, Ergocalciferol, (DRISDOL) 1.25 MG (50000 UNIT) CAPS capsule Take 50,000 Units by mouth every 7 (seven) days. 05/21/21  Yes [provider]  ?ARIPiprazole (ABILIFY) 2 MG tablet Take 1 tablet (2 mg total) by mouth every morning. ?Patient not taking: Reported on 08/05/2021 08/02/21   Myrlene Broker, MD  ?ibuprofen (ADVIL) 600 MG tablet Take 1 tablet (600 mg total) by mouth 3 (three) times daily. ?Patient not taking: Reported on 08/05/2021 05/30/21   Burgess Amor, PA-C  ?levofloxacin (LEVAQUIN) 500 MG tablet Take 1 tablet (500 mg total) by mouth daily. ?Patient not taking: Reported on 08/05/2021 07/28/21   Bethann Berkshire, MD  ?ondansetron (ZOFRAN-ODT) 4 MG disintegrating tablet 4mg  ODT q4 hours prn nausea/vomit ?Patient not taking: Reported on 08/05/2021 07/28/21   07/30/21, MD  ?temazepam (RESTORIL) 30 MG capsule Take 1 capsule (30 mg total) by mouth at bedtime as needed for sleep. ?Patient not taking: Reported on 08/05/2021 08/02/21   08/04/21, MD  ?   ? ?  Allergies    ?Prednisone, Ambien [zolpidem tartrate], and Penicillins   ? ?Review of Systems   ?Review of Systems  ?Cardiovascular:  Positive for chest pain.  ? ?Physical Exam ?Updated Vital Signs ?BP 136/74   Pulse 90   Temp 98 ?F (36.7 ?C) (Oral)   Resp (!) 22   Ht 5\' 3"  (1.6 m)   Wt 117.5 kg   SpO2 98%   BMI 45.88 kg/m?  ?Physical Exam ?Vitals and nursing note reviewed. Exam conducted with a chaperone present.  ?Constitutional:   ?   Appearance: Normal appearance. She is obese.  ?HENT:  ?   Head: Normocephalic and  atraumatic.  ?Eyes:  ?   General: No scleral icterus.    ?   Right eye: No discharge.     ?   Left eye: No discharge.  ?   Extraocular Movements: Extraocular movements intact.  ?   Pupils: Pupils are equal, round, and reactive to light.  ?Cardiovascular:  ?   Rate and Rhythm: Normal rate and regular rhythm.  ?   Pulses: Normal pulses.  ?   Heart sounds: Normal heart sounds. No murmur heard. ?  No friction rub. No gallop.  ?Pulmonary:  ?   Effort: Pulmonary effort is normal. No respiratory distress.  ?   Breath sounds: Normal breath sounds.  ?Abdominal:  ?   General: Abdomen is flat. Bowel sounds are normal. There is no distension.  ?   Palpations: Abdomen is soft.  ?   Tenderness: There is no abdominal tenderness.  ?Skin: ?   General: Skin is warm and dry.  ?   Coloration: Skin is not jaundiced.  ?Neurological:  ?   Mental Status: She is alert. Mental status is at baseline.  ?   Coordination: Coordination normal.  ?Psychiatric:     ?   Mood and Affect: Mood is anxious.  ? ? ?ED Results / Procedures / Treatments   ?Labs ?(all labs ordered are listed, but only abnormal results are displayed) ?Labs Reviewed  ?BASIC METABOLIC PANEL - Abnormal; Notable for the following components:  ?    Result Value  ? Glucose, Bld 108 (*)   ? Calcium 8.7 (*)   ? All other components within normal limits  ?CBC - Abnormal; Notable for the following components:  ? RBC 3.82 (*)   ? MCV 100.8 (*)   ? All other components within normal limits  ?TROPONIN I (HIGH SENSITIVITY)  ?TROPONIN I (HIGH SENSITIVITY)  ? ? ?EKG ?EKG Interpretation ? ?Date/Time:  Monday August 05 2021 17:44:04 EDT ?Ventricular Rate:  84 ?PR Interval:  237 ?QRS Duration: 86 ?QT Interval:  357 ?QTC Calculation: 422 ?R Axis:   -17 ?Text Interpretation: Sinus rhythm Prolonged PR interval Borderline left axis deviation Low voltage, precordial leads Abnormal R-wave progression, early transition Borderline T wave abnormalities since last tracing no significant change Confirmed by  Mancel BaleWentz, Elliott [phone]([id number]) on 08/05/2021 5:49:09 PM ? ?Radiology ?DG Chest Port 1 View ? ?Result Date: 08/05/2021 ?CLINICAL DATA:  Chest pain, generalized chest pain in a 56 year old female. EXAM: PORTABLE CHEST 1 VIEW COMPARISON:  May 30, 2021. FINDINGS: Cardiomediastinal contours and hilar structures are stable. Lungs are clear. No sign of edema or gross evidence of effusion. No visible pneumothorax. On limited assessment there is no acute skeletal process. IMPRESSION: No acute cardiopulmonary disease. Electronically Signed   By: Donzetta KohutGeoffrey  Wile M.D.   On: 08/05/2021 17:03   ? ?Procedures ?Procedures  ? ? ?Medications Ordered in ED ?  Medications  ?ondansetron (ZOFRAN-ODT) disintegrating tablet 4 mg (4 mg Oral Given 08/05/21 1711)  ?LORazepam (ATIVAN) injection 1 mg (1 mg Intramuscular Given 08/05/21 1711)  ? ? ?ED Course/ Medical Decision Making/ A&P ?  ?                        ?Medical Decision Making ?Amount and/or Complexity of Data Reviewed ?Labs: ordered. ?Radiology: ordered. ? ?Risk ?Prescription drug management. ? ? ?This patient presents to the ED for concern of chest pain, this involves an extensive number of treatment options, and is a complaint that carries with it a high risk of complications and morbidity.  The differential diagnosis includes but is not limited to ACS, pneumonia, anxiety, pericarditis, arrhythmia, pneumothorax ? ? ?Additional history obtained:  ? ?Independent historian: husband ? ?  ?Lab Tests: ? ?I ordered, viewed, and personally interpreted labs.  The pertinent results include:  ?-CBC without any leukocytosis.  Not anemic, hemoglobin 12.1. ?-BMP without gross electrolyte derangement or AKI. ?- Troponin less than 2 ?  ?Imaging Studies ordered: ? ?I directly visualized the chest x-ray, which showed no acute process ? ?I agree with the radiologist interpretation ?  ? ?ECG/Cardiac monitoring:  ? ?Per my interpretation, EKG shows sinus rhythm, slight PR interval increase but no ischemic  findings ? ?The patient was maintained on a cardiac monitor.  Visualized monitor strip which showed NSR per my interpretation.  ? ? ?Medicines ordered and prescription drug management: ? ?I ordered medication including: Zofran, Ativ

## 2021-08-07 NOTE — Patient Instructions (Signed)
? ? ? ? ? ? ? ? [name] ? 08/07/2021  ?  ? @PREFPERIOPPHARMACY @ ? ? Your procedure is scheduled on  08/14/2021. ? ? Report to 10/14/2021 at  0700 A.M. ? ? Call this number if you have problems the morning of surgery: ? [phone] ? ? Remember: ? Do not eat or drink after midnight. ? ?Use your nebulizer and your inhalers before you come and bring your rescue inhaler with you. ?  ?  ? Take these medicines the morning of surgery with A SIP OF WATER  ? ?xanax(if needed), baclofen, cymbalta, gabapentin, singulair, zofran (if needed), oxycodone(if needed), protonix. ?  ? Do not wear jewelry, make-up or nail polish. ? Do not wear lotions, powders, or perfumes, or deodorant. ? Do not shave 48 hours prior to surgery.  Men may shave face and neck. ? Do not bring valuables to the hospital. ? [facility] is not responsible for any belongings or valuables. ? ?Contacts, dentures or bridgework may not be worn into surgery.  Leave your suitcase in the car.  After surgery it may be brought to your room. ? ?For patients admitted to the hospital, discharge time will be determined by your treatment team. ? ?Patients discharged the day of surgery will not be allowed to drive home and must  have someone with them for 24 hours.  ? ? ?Special instructions:   DO NOT smoke tobacco or vape for 24 hours before your procedure. ? ?Please read over the following fact sheets that you were given. ?Coughing and Deep Breathing, Surgical Site Infection Prevention, Anesthesia Post-op Instructions, and Care and Recovery After Surgery ?  ? ? ? Minimally Invasive Cholecystectomy, Care After ?The following information offers guidance on how to care for yourself after your procedure. Your health care provider may also give you more specific instructions. If you have problems or questions, contact your health care provider. ?What can I expect after the procedure? ?After the procedure, it is common to have: ?Pain at your incision sites. You will be  given medicines to control this pain. ?Mild nausea or vomiting. ?Bloating and possible shoulder pain from the gas that was used during the procedure. ?Follow these instructions at home: ?Medicines ?Take over-the-counter and prescription medicines only as told by your health care provider. ?If you were prescribed an antibiotic medicine, take it as told by your health care provider. Do not stop using the antibiotic even if you start to feel better. ?Ask your health care provider if the medicine prescribed to you: ?Requires you to avoid driving or using machinery. ?Can cause constipation. You may need to take these actions to prevent or treat constipation: ?Drink enough fluid to keep your urine pale yellow. ?Take over-the-counter or prescription medicines. ?Eat foods that are high in fiber, such as beans, whole grains, and fresh fruits and vegetables. ?Limit foods that are high in fat and processed sugars, such as fried or sweet foods. ?Incision care ? ?Follow instructions from your health care provider about how to take care of your incisions. Make sure you: ?Wash your hands with soap and water for at least 20 seconds before and after you change your bandage (dressing). If soap and water are not available, use hand sanitizer. ?Change your dressing as told by your health care provider. ?Leave stitches (sutures), skin glue, or adhesive strips in place. These skin closures may need to be in place for 2 weeks or longer. If adhesive strip edges start to loosen and curl  up, you may trim the loose edges. Do not remove adhesive strips completely unless your health care provider tells you to do that. ?Do not take baths, swim, or use a hot tub until your health care provider approves. Ask your health care provider if you may take showers. You may only be allowed to take sponge baths. ?Check your incision area every day for signs of infection. Check for: ?More redness, swelling, or pain. ?Fluid or blood. ?Warmth. ?Pus or a bad  smell. ?Activity ?Rest as told by your health care provider. Do not do activities that require a lot of effort. ?Avoid sitting for a long time without moving. Get up to take short walks every 1-2 hours. This is important to improve blood flow and breathing. Ask for help if you feel weak or unsteady. ?Do not lift anything that is heavier than 10 lb (4.5 kg), or the limit that you are told, until your health care provider says that it is safe. ?Do not play contact sports until your health care provider approves. ?Do not return to work or school until your health care provider approves. ?Return to your normal activities as told by your health care provider. Ask your health care provider what activities are safe for you. ?General instructions ?If you were given a sedative during the procedure, it can affect you for several hours. Do not drive or operate machinery until your health care provider says that it is safe. ?Keep all follow-up visits. This is important. ?Contact a health care provider if: ?You develop a rash. ?You have more redness, swelling, or pain around your incisions. ?You have fluid or blood coming from your incisions. ?Your incisions feel warm to the touch. ?You have pus or a bad smell coming from your incisions. ?You have a fever. ?One or more of your incisions breaks open. ?Get help right away if: ?You have trouble breathing. ?You have chest pain. ?You have more pain in your shoulders. ?You faint or feel dizzy when you stand. ?You have severe pain in your abdomen. ?You have nausea or vomiting that lasts for more than one day. ?You have leg pain that is new or unusual, or if it is localized to one specific spot. ?These symptoms may represent a serious problem that is an emergency. Do not wait to see if the symptoms will go away. Get medical help right away. Call your local emergency services (911 in the [place] Do not drive yourself to the hospital. ?Summary ?After your procedure, it is common to have  pain at the incision sites. You may also have nausea or bloating. ?Follow your health care provider's instructions about medicine, activity restrictions, and caring for your incision areas. Do not do activities that require a lot of effort. ?Contact a health care provider if you have a fever or other signs of infection, such as more redness, swelling, or pain around the incisions. ?Get help right away if you have chest pain, increasing pain in the shoulders, or trouble breathing. ?This information is not intended to replace advice given to you by your health care provider. Make sure you discuss any questions you have with your health care provider. ?Document Revised: 10/23/2020 Document Reviewed: 10/23/2020 ?Elsevier Patient Education ? 2022 [employer] ?General Anesthesia, Adult, Care After ?This sheet gives you information about how to care for yourself after your procedure. Your health care provider may also give you more specific instructions. If you have problems or questions, contact your health care provider. ?What can I expect  after the procedure? ?After the procedure, the following side effects are common: ?Pain or discomfort at the IV site. ?Nausea. ?Vomiting. ?Sore throat. ?Trouble concentrating. ?Feeling cold or chills. ?Feeling weak or tired. ?Sleepiness and fatigue. ?Soreness and body aches. These side effects can affect parts of the body that were not involved in surgery. ?Follow these instructions at home: ?For the time period you were told by your health care provider: ? ?Rest. ?Do not participate in activities where you could fall or become injured. ?Do not drive or use machinery. ?Do not drink alcohol. ?Do not take sleeping pills or medicines that cause drowsiness. ?Do not make important decisions or sign legal documents. ?Do not take care of children on your own. ?Eating and drinking ?Follow any instructions from your health care provider about eating or drinking restrictions. ?When you feel  hungry, start by eating small amounts of foods that are soft and easy to digest (bland), such as toast. Gradually return to your regular diet. ?Drink enough fluid to keep your urine pale yellow. ?If you vomit

## 2021-08-12 ENCOUNTER — Other Ambulatory Visit: Payer: Self-pay

## 2021-08-12 ENCOUNTER — Encounter (HOSPITAL_COMMUNITY): Payer: Self-pay

## 2021-08-12 ENCOUNTER — Encounter (HOSPITAL_COMMUNITY)
Admission: RE | Admit: 2021-08-12 | Discharge: 2021-08-12 | Disposition: A | Discharge status: Home / Self Care | Payer: BC Managed Care – PPO | Source: Ambulatory Visit | Attending: Surgery | Admitting: Surgery

## 2021-08-12 HISTORY — DX: Unspecified asthma, uncomplicated: J45.909

## 2021-08-14 ENCOUNTER — Encounter (HOSPITAL_COMMUNITY)
Admission: RE | Disposition: A | Discharge status: Home / Self Care | Payer: Self-pay | Source: Home / Self Care | Attending: Surgery

## 2021-08-14 ENCOUNTER — Ambulatory Visit (HOSPITAL_COMMUNITY): Payer: BC Managed Care – PPO | Admitting: Anesthesiology

## 2021-08-14 ENCOUNTER — Encounter (HOSPITAL_COMMUNITY): Payer: Self-pay | Admitting: Surgery

## 2021-08-14 ENCOUNTER — Other Ambulatory Visit: Payer: Self-pay

## 2021-08-14 ENCOUNTER — Ambulatory Visit (HOSPITAL_COMMUNITY)
Admission: RE | Admit: 2021-08-14 | Discharge: 2021-08-14 | Disposition: A | Discharge status: Home / Self Care | Payer: BC Managed Care – PPO | Attending: Surgery | Admitting: Surgery

## 2021-08-14 DIAGNOSIS — F32A Depression, unspecified: Secondary | ICD-10-CM | POA: Insufficient documentation

## 2021-08-14 DIAGNOSIS — F419 Anxiety disorder, unspecified: Secondary | ICD-10-CM | POA: Diagnosis not present

## 2021-08-14 DIAGNOSIS — M797 Fibromyalgia: Secondary | ICD-10-CM | POA: Diagnosis not present

## 2021-08-14 DIAGNOSIS — K8010 Calculus of gallbladder with chronic cholecystitis without obstruction: Secondary | ICD-10-CM | POA: Diagnosis not present

## 2021-08-14 DIAGNOSIS — Z6841 Body Mass Index (BMI) 40.0 and over, adult: Secondary | ICD-10-CM | POA: Insufficient documentation

## 2021-08-14 DIAGNOSIS — G4730 Sleep apnea, unspecified: Secondary | ICD-10-CM | POA: Diagnosis not present

## 2021-08-14 DIAGNOSIS — K8020 Calculus of gallbladder without cholecystitis without obstruction: Secondary | ICD-10-CM

## 2021-08-14 DIAGNOSIS — K8012 Calculus of gallbladder with acute and chronic cholecystitis without obstruction: Secondary | ICD-10-CM | POA: Diagnosis not present

## 2021-08-14 DIAGNOSIS — K219 Gastro-esophageal reflux disease without esophagitis: Secondary | ICD-10-CM | POA: Diagnosis not present

## 2021-08-14 DIAGNOSIS — J45909 Unspecified asthma, uncomplicated: Secondary | ICD-10-CM | POA: Diagnosis not present

## 2021-08-14 HISTORY — DX: Sleep apnea, unspecified: G47.30

## 2021-08-14 HISTORY — PX: CHOLECYSTECTOMY: SHX55

## 2021-08-14 SURGERY — LAPAROSCOPIC CHOLECYSTECTOMY
Anesthesia: General | Site: Abdomen

## 2021-08-14 MED ORDER — CIPROFLOXACIN IN D5W 400 MG/200ML IV SOLN
400.0000 mg | INTRAVENOUS | Status: AC
  Administered 2021-08-14: 400 mg via INTRAVENOUS
  Filled 2021-08-14: qty 200

## 2021-08-14 MED ORDER — SUCCINYLCHOLINE 20MG/ML (10ML) SYRINGE FOR MEDFUSION PUMP - OPTIME
INTRAMUSCULAR | Status: DC | PRN
  Administered 2021-08-14: 120 mg via INTRAVENOUS

## 2021-08-14 MED ORDER — ORAL CARE MOUTH RINSE: 15.0000 mL | Freq: Once | OROMUCOSAL | Status: AC

## 2021-08-14 MED ORDER — ONDANSETRON HCL 4 MG/2ML IJ SOLN
INTRAMUSCULAR | Status: AC
  Filled 2021-08-14: qty 2

## 2021-08-14 MED ORDER — LIDOCAINE HCL (CARDIAC) PF 50 MG/5ML IV SOSY
PREFILLED_SYRINGE | INTRAVENOUS | Status: DC | PRN
  Administered 2021-08-14: 80 mg via INTRAVENOUS

## 2021-08-14 MED ORDER — ONDANSETRON HCL 4 MG/2ML IJ SOLN
4.0000 mg | Freq: Once | INTRAMUSCULAR | Status: AC | PRN
  Administered 2021-08-14: 4 mg via INTRAVENOUS

## 2021-08-14 MED ORDER — LIDOCAINE HCL (PF) 2 % IJ SOLN
INTRAMUSCULAR | Status: AC
  Filled 2021-08-14: qty 5

## 2021-08-14 MED ORDER — HYDROMORPHONE HCL 1 MG/ML IJ SOLN
0.2500 mg | INTRAMUSCULAR | Status: AC | PRN
  Administered 2021-08-14 (×4): 0.5 mg via INTRAVENOUS
  Filled 2021-08-14 (×3): qty 0.5

## 2021-08-14 MED ORDER — BUPIVACAINE HCL (PF) 0.5 % IJ SOLN
INTRAMUSCULAR | Status: DC | PRN
Start: 2021-08-14 — End: 2021-08-14
  Administered 2021-08-14: 15 mL

## 2021-08-14 MED ORDER — PROPOFOL 10 MG/ML IV BOLUS
INTRAVENOUS | Status: AC
  Filled 2021-08-14: qty 20

## 2021-08-14 MED ORDER — FENTANYL CITRATE (PF) 100 MCG/2ML IJ SOLN
INTRAMUSCULAR | Status: DC | PRN
Start: 2021-08-14 — End: 2021-08-14
  Administered 2021-08-14 (×5): 50 ug via INTRAVENOUS

## 2021-08-14 MED ORDER — MEPERIDINE HCL 50 MG/ML IJ SOLN: 6.2500 mg | INTRAMUSCULAR | Status: DC | PRN

## 2021-08-14 MED ORDER — SCOPOLAMINE 1 MG/3DAYS TD PT72
1.0000 | MEDICATED_PATCH | Freq: Once | TRANSDERMAL | Status: DC
  Administered 2021-08-14: 1.5 mg via TRANSDERMAL

## 2021-08-14 MED ORDER — CHLORHEXIDINE GLUCONATE 0.12 % MT SOLN
15.0000 mL | Freq: Once | OROMUCOSAL | Status: AC
  Administered 2021-08-14: 15 mL via OROMUCOSAL

## 2021-08-14 MED ORDER — METRONIDAZOLE 500 MG/100ML IV SOLN
500.0000 mg | INTRAVENOUS | Status: AC
  Administered 2021-08-14: 500 mg via INTRAVENOUS
  Filled 2021-08-14: qty 100

## 2021-08-14 MED ORDER — HEMOSTATIC AGENTS (NO CHARGE) OPTIME
TOPICAL | Status: DC | PRN
  Administered 2021-08-14 (×2): 1

## 2021-08-14 MED ORDER — LACTATED RINGERS IV SOLN: INTRAVENOUS | Status: DC

## 2021-08-14 MED ORDER — ROCURONIUM 10MG/ML (10ML) SYRINGE FOR MEDFUSION PUMP - OPTIME
INTRAVENOUS | Status: DC | PRN
  Administered 2021-08-14: 40 mg via INTRAVENOUS

## 2021-08-14 MED ORDER — ACETAMINOPHEN 500 MG PO TABS: 1000.0000 mg | ORAL_TABLET | Freq: Four times a day (QID) | ORAL | 0 refills | Status: AC

## 2021-08-14 MED ORDER — MIDAZOLAM HCL 2 MG/2ML IJ SOLN
INTRAMUSCULAR | Status: AC
  Filled 2021-08-14: qty 2

## 2021-08-14 MED ORDER — PROPOFOL 10 MG/ML IV BOLUS
INTRAVENOUS | Status: DC | PRN
  Administered 2021-08-14: 50 mg via INTRAVENOUS
  Administered 2021-08-14: 200 mg via INTRAVENOUS

## 2021-08-14 MED ORDER — BUPIVACAINE HCL (PF) 0.5 % IJ SOLN
INTRAMUSCULAR | Status: AC
  Filled 2021-08-14: qty 30

## 2021-08-14 MED ORDER — OXYCODONE HCL 5 MG PO TABS: 5.0000 mg | ORAL_TABLET | Freq: Four times a day (QID) | ORAL | 0 refills | Status: AC | PRN

## 2021-08-14 MED ORDER — SODIUM CHLORIDE 0.9 % IR SOLN
Status: DC | PRN
  Administered 2021-08-14: 1000 mL
  Administered 2021-08-14: 3000 mL

## 2021-08-14 MED ORDER — SUCCINYLCHOLINE CHLORIDE 200 MG/10ML IV SOSY
PREFILLED_SYRINGE | INTRAVENOUS | Status: AC
  Filled 2021-08-14: qty 10

## 2021-08-14 MED ORDER — SUGAMMADEX SODIUM 200 MG/2ML IV SOLN
INTRAVENOUS | Status: DC | PRN
  Administered 2021-08-14: 200 mg via INTRAVENOUS

## 2021-08-14 MED ORDER — CHLORHEXIDINE GLUCONATE CLOTH 2 % EX PADS: 6.0000 | MEDICATED_PAD | Freq: Once | CUTANEOUS | Status: DC

## 2021-08-14 MED ORDER — HYDROMORPHONE HCL 1 MG/ML IJ SOLN
INTRAMUSCULAR | Status: AC
  Filled 2021-08-14: qty 0.5

## 2021-08-14 MED ORDER — ONDANSETRON HCL 4 MG/2ML IJ SOLN
4.0000 mg | Freq: Once | INTRAMUSCULAR | Status: AC
  Administered 2021-08-14: 4 mg via INTRAVENOUS

## 2021-08-14 MED ORDER — MIDAZOLAM HCL 5 MG/5ML IJ SOLN
INTRAMUSCULAR | Status: DC | PRN
  Administered 2021-08-14: 2 mg via INTRAVENOUS

## 2021-08-14 MED ORDER — DEXAMETHASONE SODIUM PHOSPHATE 10 MG/ML IJ SOLN
INTRAMUSCULAR | Status: DC | PRN
  Administered 2021-08-14: 10 mg via INTRAVENOUS

## 2021-08-14 MED ORDER — SCOPOLAMINE 1 MG/3DAYS TD PT72
MEDICATED_PATCH | TRANSDERMAL | Status: AC
  Filled 2021-08-14: qty 1

## 2021-08-14 MED ORDER — DOCUSATE SODIUM 100 MG PO CAPS: 100.0000 mg | ORAL_CAPSULE | Freq: Two times a day (BID) | ORAL | 2 refills | Status: DC

## 2021-08-14 MED ORDER — FENTANYL CITRATE (PF) 250 MCG/5ML IJ SOLN
INTRAMUSCULAR | Status: AC
  Filled 2021-08-14: qty 5

## 2021-08-14 SURGICAL SUPPLY — 48 items
ADH SKN CLS APL DERMABOND .7 (GAUZE/BANDAGES/DRESSINGS) ×1
APL PRP STRL LF DISP 70% ISPRP (MISCELLANEOUS) ×1
APPLIER CLIP ROT 10 11.4 M/L (STAPLE) ×2
APR CLP MED LRG 11.4X10 (STAPLE) ×1
BAG RETRIEVAL 10 (BASKET) ×1
BLADE SURG 15 STRL LF DISP TIS (BLADE) ×1 IMPLANT
BLADE SURG 15 STRL SS (BLADE) ×2
CHLORAPREP W/TINT 26 (MISCELLANEOUS) ×2 IMPLANT
CLIP APPLIE ROT 10 11.4 M/L (STAPLE) ×1 IMPLANT
CLOTH BEACON ORANGE TIMEOUT ST (SAFETY) ×2 IMPLANT
COVER LIGHT HANDLE STERIS (MISCELLANEOUS) ×4 IMPLANT
DECANTER SPIKE VIAL GLASS SM (MISCELLANEOUS) ×2 IMPLANT
DERMABOND ADVANCED (GAUZE/BANDAGES/DRESSINGS) ×1
DERMABOND ADVANCED .7 DNX12 (GAUZE/BANDAGES/DRESSINGS) ×1 IMPLANT
ELECT REM PT RETURN 9FT ADLT (ELECTROSURGICAL) ×2
ELECTRODE REM PT RTRN 9FT ADLT (ELECTROSURGICAL) ×1 IMPLANT
GLOVE BIO SURGEON STRL SZ 6.5 (GLOVE) ×1 IMPLANT
GLOVE BIOGEL PI IND STRL 6.5 (GLOVE) IMPLANT
GLOVE BIOGEL PI IND STRL 7.0 (GLOVE) IMPLANT
GLOVE BIOGEL PI INDICATOR 6.5 (GLOVE) ×1
GLOVE BIOGEL PI INDICATOR 7.0 (GLOVE) ×1
GLOVE ECLIPSE 6.5 STRL STRAW (GLOVE) ×1 IMPLANT
GLOVE SURG SS PI 6.5 STRL IVOR (GLOVE) ×1 IMPLANT
GLOVE SURG SS PI 7.5 STRL IVOR (GLOVE) ×1 IMPLANT
GOWN STRL REUS W/TWL LRG LVL3 (GOWN DISPOSABLE) ×6 IMPLANT
HEMOSTAT SNOW SURGICEL 2X4 (HEMOSTASIS) ×3 IMPLANT
INST SET LAPROSCOPIC AP (KITS) ×2 IMPLANT
IV NS IRRIG 3000ML ARTHROMATIC (IV SOLUTION) ×1 IMPLANT
KIT TURNOVER KIT A (KITS) ×2 IMPLANT
MANIFOLD NEPTUNE II (INSTRUMENTS) ×2 IMPLANT
NDL INSUFFLATION 14GA 120MM (NEEDLE) ×1 IMPLANT
NEEDLE INSUFFLATION 14GA 120MM (NEEDLE) ×2 IMPLANT
NS IRRIG 1000ML POUR BTL (IV SOLUTION) ×2 IMPLANT
PACK LAP CHOLE LZT030E (CUSTOM PROCEDURE TRAY) ×2 IMPLANT
PAD ARMBOARD 7.5X6 YLW CONV (MISCELLANEOUS) ×2 IMPLANT
SET BASIN LINEN APH (SET/KITS/TRAYS/PACK) ×2 IMPLANT
SET TUBE IRRIG SUCTION NO TIP (IRRIGATION / IRRIGATOR) ×1 IMPLANT
SET TUBE SMOKE EVAC HIGH FLOW (TUBING) ×2 IMPLANT
SLEEVE ENDOPATH XCEL 5M (ENDOMECHANICALS) ×2 IMPLANT
SUT MNCRL AB 4-0 PS2 18 (SUTURE) ×4 IMPLANT
SUT VICRYL 0 UR6 27IN ABS (SUTURE) ×3 IMPLANT
SYS BAG RETRIEVAL 10MM (BASKET) ×1
SYSTEM BAG RETRIEVAL 10MM (BASKET) ×1 IMPLANT
TROCAR ENDO BLADELESS 11MM (ENDOMECHANICALS) ×2 IMPLANT
TROCAR XCEL NON-BLD 5MMX100MML (ENDOMECHANICALS) ×2 IMPLANT
TROCAR XCEL UNIV SLVE 11M 100M (ENDOMECHANICALS) ×2 IMPLANT
TUBE CONNECTING 12X1/4 (SUCTIONS) ×2 IMPLANT
WARMER LAPAROSCOPE (MISCELLANEOUS) ×2 IMPLANT

## 2021-08-14 NOTE — Anesthesia Preprocedure Evaluation (Signed)
Anesthesia Evaluation  ?Patient identified by MRN, date of birth, ID band ?Patient awake ? ? ? ?Reviewed: ?Allergy & Precautions, NPO status , Patient's Chart, lab work & pertinent test results ? ?Airway ?Mallampati: II ? ?TM Distance: >3 FB ?Neck ROM: Full ? ? ? Dental ? ?(+) Edentulous Upper, Edentulous Lower ?  ?Pulmonary ?asthma , sleep apnea ,  ?  ?Pulmonary exam normal ?breath sounds clear to auscultation ? ? ? ? ? ? Cardiovascular ?negative cardio ROS ?Normal cardiovascular exam ?Rhythm:Regular Rate:Normal ? ? ?  ?Neuro/Psych ? Headaches, PSYCHIATRIC DISORDERS Anxiety Depression  Neuromuscular disease   ? GI/Hepatic ?Neg liver ROS, GERD  Medicated and Controlled,  ?Endo/Other  ?Morbid obesity ? Renal/GU ?negative Renal ROS  ?negative genitourinary ?  ?Musculoskeletal ? ?(+) Fibromyalgia - ? Abdominal ?  ?Peds ?negative pediatric ROS ?(+)  Hematology ?negative hematology ROS ?(+)   ?Anesthesia Other Findings ? ? Reproductive/Obstetrics ?negative OB ROS ? ?  ? ? ? ? ? ? ? ? ? ? ? ? ? ?  ?  ? ? ? ? ? ? ? ?Anesthesia Physical ?Anesthesia Plan ? ?ASA: 3 ? ?Anesthesia Plan: General  ? ?Post-op Pain Management: Dilaudid IV and Minimal or no pain anticipated  ? ?Induction: Intravenous ? ?PONV Risk Score and Plan: 4 or greater and Ondansetron, Scopolamine patch - Pre-op, Dexamethasone and Metaclopromide ? ?Airway Management Planned: Oral ETT ? ?Additional Equipment:  ? ?Intra-op Plan:  ? ?Post-operative Plan: Extubation in OR ? ?Informed Consent: I have reviewed the patients History and Physical, chart, labs and discussed the procedure including the risks, benefits and alternatives for the proposed anesthesia with the patient or authorized representative who has indicated his/her understanding and acceptance.  ? ? ? ?Dental advisory given ? ?Plan Discussed with: CRNA and Surgeon ? ?Anesthesia Plan Comments:   ? ? ? ? ? ?Anesthesia Quick Evaluation ? ?

## 2021-08-14 NOTE — Discharge Instructions (Signed)
Ambulatory Surgery Discharge Instructions  General Anesthesia or Sedation Do not drive or operate heavy machinery for 24 hours.  Do not consume alcohol, tranquilizers, sleeping medications, or any non-prescribed medications for 24 hours. Do not make important decisions or sign any important papers in the next 24 hours. You should have someone with you tonight at home.  Activity  You are advised to go directly home from the hospital.  Restrict your activities and rest for a day.  Resume light activity tomorrow. No heavy lifting over 10 lbs or strenuous exercise.  Fluids and Diet Begin with clear liquids, bouillon, dry toast, soda crackers.  If not nauseated, you may go to a regular diet when you desire.  Greasy and spicy foods are not advised.  Medications  If you have not had a bowel movement in 24 hours, take 2 tablespoons over the counter Milk of mag.             You May resume your blood thinners tomorrow (Aspirin, coumadin, or other).  You are being discharged with prescriptions for Opioid/Narcotic Medications: There are some specific considerations for these medications that you should know. Opioid Meds have risks & benefits. Addiction to these meds is always a concern with prolonged use Take medication only as directed Do not drive while taking narcotic pain medication Do not crush tablets or capsules Do not use a different container than medication was dispensed in Lock the container of medication in a cool, dry place out of reach of children and pets. Opioid medication can cause addiction Do not share with anyone else (this is a felony) Do not store medications for future use. Dispose of them properly.     Disposal:  Find a [facility] household drug take back site near you.  If you can't get to a drug take back site, use the recipe below as a last resort to dispose of expired, unused or unwanted drugs. Disposal  (Do not dispose chemotherapy drugs this way, talk to your  prescribing doctor instead.) Step 1: Mix drugs (do not crush) with dirt, kitty litter, or used coffee grounds and add a small amount of water to dissolve any solid medications. Step 2: Seal drugs in plastic bag. Step 3: Place plastic bag in trash. Step 4: Take prescription container and scratch out personal information, then recycle or throw away.  Operative Site  You have a liquid bandage over your incisions, this will begin to flake off in about a week. Ok to shower tomorrow. Keep wound clean and dry. No baths or swimming. No lifting more than 10 pounds.  Contact Information: If you have questions or concerns, please call our office, [phone], Monday- Thursday 8AM-5PM and Friday 8AM-12Noon.  If it is after hours or on the weekend, please call [facility] Main Number, [phone], and ask to speak to the surgeon on call for Dr. Kevork Joyce at [facility].   SPECIFIC COMPLICATIONS TO WATCH FOR: Inability to urinate Fever over 101? F by mouth Nausea and vomiting lasting longer than 24 hours. Pain not relieved by medication ordered Swelling around the operative site Increased redness, warmth, hardness, around operative area Numbness, tingling, or cold fingers or toes Blood -soaked dressing, (small amounts of oozing may be normal) Increasing and progressive drainage from surgical area or exam site  

## 2021-08-14 NOTE — Interval H&P Note (Signed)
History and Physical Interval Note: ? ?08/14/2021 ?8:18 AM ? ?Virginia Trujillo  has presented today for surgery, with the diagnosis of CHOLELITHASIS.  The various methods of treatment have been discussed with the patient and family. After consideration of risks, benefits and other options for treatment, the patient has consented to  Procedure(s): ?LAPAROSCOPIC CHOLECYSTECTOMY (N/A) as a surgical intervention.  The patient's history has been reviewed, patient examined, no change in status, stable for surgery.  I have reviewed the patient's chart and labs.  Questions were answered to the patient's satisfaction.   ? ? ?Virginia Trujillo ? ? ?

## 2021-08-14 NOTE — Op Note (Signed)
Operative Note ?  ?Preoperative Diagnosis: Symptomatic cholelithiasis ?  ?Postoperative Diagnosis: Same ?  ?Procedure(s) Performed: Laparoscopic cholecystectomy ?  ?Surgeon: Theophilus Kinds, DO  ?  ?Assistants: Franky Macho, MD ?  ?Anesthesia: General endotracheal ?  ?Anesthesiologist: Dr. Alva Garnet ? ?Specimens: Gallbladder  ?  ?Estimated Blood Loss: Minimal  ?  ?Blood Replacement: None  ?  ?Complications: None  ?  ?Indications: Patient is a 56 year old female who presents for laparoscopic cholecystectomy.  She complains of upper abdominal pain, and underwent an abdominal ultrasound that demonstrated cholelithiasis.  She is agreeable to surgery. ? ?All risks, benefits, and alternatives to laparoscopic cholecystectomy were discussed with the patient and her family, all of their questions were answered to their expressed satisfaction. The patient expresses she wishes to proceed, and informed consent was obtained. ? ?Operative Findings: Chronic inflammatory changes and adhesive disease ?  ?Procedure: The patient was taken to the operating room and placed supine. General endotracheal anesthesia was induced. Intravenous antibiotics were administered per protocol. An orogastric tube positioned to decompress the stomach. The abdomen was prepared and draped in the usual sterile fashion.  ?  ?A supraumbilical incision was made and a Veress technique was utilized to achieve pneumoperitoneum to 15 mmHg with carbon dioxide. A 11 mm optiview port was placed through the supraumbilical region, and a 10 mm 0-degree operative laparoscope was introduced. The area underlying the trocar and Veress needle were inspected and without evidence of injury.  Remaining trocars were placed under direct vision. Two 5 mm ports were placed in the right abdomen, between the anterior axillary and midclavicular line.  A final 11 mm port was placed through the mid-epigastrium, near the falciform ligament.  ?  ?The gallbladder fundus was elevated  cephalad and the infundibulum was retracted to the patient's right. Adhesions to the gallbladder were taken down.  The gallbladder/cystic duct junction was skeletonized. The cystic artery noted in the triangle of Calot and was also skeletonized.  We then continued liberal medial and lateral dissection until the critical view of safety was achieved.  ?  ?The cystic duct and cystic artery were doubly clipped and divided. The gallbladder was then dissected from the liver bed with electrocautery. The specimen was placed in an Endopouch and was retrieved through the epigastric site. ?  ?Final inspection revealed acceptable hemostasis. Surgical SNOW was placed in the gallbladder bed.  Trocars were removed and pneumoperitoneum was released. Skin incisions were closed with 4-0 Monocryl subcuticular sutures and Dermabond. The patient was awakened from anesthesia and extubated without complication.  ?  ?Theophilus Kinds, DO  ?[facility] ?[address] ?[place], [place] [address] ?[phone] (office) ? ? ?

## 2021-08-14 NOTE — Anesthesia Postprocedure Evaluation (Signed)
Anesthesia Post Note ? ?Patient: Virginia Trujillo ? ?Procedure(s) Performed: LAPAROSCOPIC CHOLECYSTECTOMY (Abdomen) ? ?Patient location during evaluation: Phase II ?Anesthesia Type: General ?Level of consciousness: awake and alert and oriented ?Pain management: pain level controlled ?Vital Signs Assessment: post-procedure vital signs reviewed and stable ?Respiratory status: spontaneous breathing, nonlabored ventilation and respiratory function stable ?Cardiovascular status: blood pressure returned to baseline and stable ?Postop Assessment: no apparent nausea or vomiting ?Anesthetic complications: no ? ? ?No notable events documented. ? ? ?Last Vitals:  ?Vitals:  ? 08/14/21 1130 08/14/21 1147  ?BP: 137/82 (!) 140/96  ?Pulse: 62 69  ?Resp: 12 13  ?Temp:  36.4 ?C  ?SpO2: 98% 95%  ?  ?Last Pain:  ?Vitals:  ? 08/14/21 1147  ?TempSrc: Oral  ?PainSc: 3   ? ? ?  ?  ?  ?  ?  ?  ? ?Dolores Ewing C Lejla Moeser ? ? ? ? ?

## 2021-08-14 NOTE — Transfer of Care (Signed)
Immediate Anesthesia Transfer of Care Note ? ?Patient: Virginia Trujillo ? ?Procedure(s) Performed: LAPAROSCOPIC CHOLECYSTECTOMY (Abdomen) ? ?Patient Location: PACU ? ?Anesthesia Type:General ? ?Level of Consciousness: awake ? ?Airway & Oxygen Therapy: Patient Spontanous Breathing and Patient connected to nasal cannula oxygen ? ?Post-op Assessment: Report given to RN ? ?Post vital signs: Reviewed and stable ? ?Last Vitals:  ?Vitals Value Taken Time  ?BP 146/95 08/14/21 1020  ?Temp    ?Pulse 68 08/14/21 1023  ?Resp 16 08/14/21 1023  ?SpO2 100 % 08/14/21 1023  ?Vitals shown include unvalidated device data. ? ?Last Pain:  ?Vitals:  ? 08/14/21 0732  ?TempSrc: Oral  ?PainSc: 0-No pain  ?   ? ?Patients Stated Pain Goal: 7 (08/14/21 0732) ? ?Complications: No notable events documented. ?

## 2021-08-14 NOTE — Progress Notes (Signed)
Update Note: ? ?Spoke with the patient's husband, Satsuki Zillmer, in the consultation room.  I explained that her surgery went well, and we were able to remove her gallbladder.  I further explained that she has dissolvable stitches under the skin and skin glue in place, which will start to flake off in 10-14 days.  She has been discharged with Roxicodone, schedule Tylenol, and Colace.  I advised him that she should not take the Percocet while she is taking the schedule Tylenol.  She will have a phone follow up with me in 2 weeks.  All questions answered to his expressed satisfaction. ? ?Theophilus Kinds, DO ?[facility] ?[address] ?[place], [place] [address] ?[phone] (office) ? ?

## 2021-08-15 ENCOUNTER — Encounter (HOSPITAL_COMMUNITY): Payer: Self-pay | Admitting: Surgery

## 2021-08-15 LAB — SURGICAL PATHOLOGY

## 2021-08-27 ENCOUNTER — Ambulatory Visit (INDEPENDENT_AMBULATORY_CARE_PROVIDER_SITE_OTHER): Payer: BC Managed Care – PPO | Admitting: Surgery

## 2021-08-27 DIAGNOSIS — Z09 Encounter for follow-up examination after completed treatment for conditions other than malignant neoplasm: Secondary | ICD-10-CM

## 2021-08-27 MED ORDER — OXYCODONE HCL 5 MG PO TABS: 5.0000 mg | ORAL_TABLET | Freq: Three times a day (TID) | ORAL | 0 refills | Status: DC | PRN

## 2021-08-27 NOTE — Progress Notes (Signed)
[facility] ? ?I am calling the patient for post operative evaluation. This is not a billable encounter as it is under the global charges for the surgery.  The patient had a laparoscopic cholecystectomy on 4/12. The patient reports that she has overall been doing well since the surgery.  She does complain of some pain in some of her surgical sites, but the pain is significantly better than the pain she experienced preoperatively.  She did have some nausea over the weekend with eating.  This was the first episode postoperatively.  She denies any jaundice, fevers, and chills.  She is moving her bowels without issue.  The incisions are healing well, with skin glue starting to flake off.  I advised the patient that some pain at her surgical sites is normal postoperatively.  If her pain continues, or she experiences pain like she had preoperatively, advised her to call the office, she might need further work-up with GI. All questions were answered to her expressed satisfaction. ? ?Pathology: ?A. GALLBLADDER, CHOLECYSTECTOMY:  ?Chronic cholecystitis and cholelithiasis.  ?Cholesterolosis of the gallbladder.  ?Negative for neoplasm.  ? ?Will see the patient PRN.  ? ?Theophilus Kinds, DO ?[facility] ?[address] ?[place], [place] [address] ?[phone] (office) ? ?

## 2021-09-03 ENCOUNTER — Telehealth (HOSPITAL_COMMUNITY): Payer: BC Managed Care – PPO | Admitting: Psychiatry

## 2021-09-03 ENCOUNTER — Ambulatory Visit (INDEPENDENT_AMBULATORY_CARE_PROVIDER_SITE_OTHER): Payer: BC Managed Care – PPO | Admitting: Psychiatry

## 2021-09-03 DIAGNOSIS — F411 Generalized anxiety disorder: Secondary | ICD-10-CM
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
## 2021-10-02 ENCOUNTER — Telehealth (INDEPENDENT_AMBULATORY_CARE_PROVIDER_SITE_OTHER): Payer: BC Managed Care – PPO | Admitting: Psychiatry

## 2021-10-02 ENCOUNTER — Encounter (HOSPITAL_COMMUNITY): Payer: Self-pay | Admitting: Psychiatry

## 2021-10-02 DIAGNOSIS — F321 Major depressive disorder, single episode, moderate: Secondary | ICD-10-CM

## 2021-10-02 DIAGNOSIS — F411 Generalized anxiety disorder: Secondary | ICD-10-CM | POA: Diagnosis not present

## 2021-10-02 MED ORDER — DULOXETINE HCL 60 MG PO CPEP: 60.0000 mg | ORAL_CAPSULE | Freq: Two times a day (BID) | ORAL | 2 refills | Status: DC

## 2021-10-02 MED ORDER — ALPRAZOLAM 1 MG PO TABS
1.0000 mg | ORAL_TABLET | Freq: Three times a day (TID) | ORAL | 2 refills | Status: DC | PRN
Start: 2021-10-02 — End: 2021-12-16

## 2021-10-02 MED ORDER — TEMAZEPAM 30 MG PO CAPS
30.0000 mg | ORAL_CAPSULE | Freq: Every evening | ORAL | 2 refills | Status: DC | PRN
Start: 2021-10-02 — End: 2021-12-16

## 2021-10-02 MED ORDER — ARIPIPRAZOLE 2 MG PO TABS: 2.0000 mg | ORAL_TABLET | ORAL | 2 refills | Status: DC

## 2021-10-02 NOTE — Progress Notes (Signed)
Virtual Visit via Telephone Note  I connected with Virginia Trujillo on 10/02/21 at  9:20 AM EDT by telephone and verified that I am speaking with the correct person using two identifiers.  Location: Patient: home Provider: office   I discussed the limitations, risks, security and privacy concerns of performing an evaluation and management service by telephone and the availability of in person appointments. I also discussed with the patient that there may be a patient responsible charge related to this service. The patient expressed understanding and agreed to proceed.     I discussed the assessment and treatment plan with the patient. The patient was provided an opportunity to ask questions and all were answered. The patient agreed with the plan and demonstrated an understanding of the instructions.   The patient was advised to call back or seek an in-person evaluation if the symptoms worsen or if the condition fails to improve as anticipated.  I provided 15 minutes of non-face-to-face time during this encounter.   Virginia Ruder, MD  [facility] MD/PA/NP OP Progress Note  10/02/2021 9:34 AM Virginia Trujillo  MRN:  [id number]  Chief Complaint:  Chief Complaint  Patient presents with   Depression   Anxiety   Follow-up   HPI: This patient is a 56 year old married black female who lives with her husband in [place].  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  The patient returns for follow-up after 3 months.  Since I last seen her she had had a cholecystectomy on 09/09/21.  Her brother also died suddenly on 08-27-2021.  With all of this going on she decided to quit her job.  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  She was able to go on a cruise with her family and enjoyed it.  She stated that she was not sleeping well for a while and she had stopped the temazepam but has gone back on it and is now  sleeping better.  She states that her mood is fairly stable with the combination of Cymbalta and Abilify.  Her energy is fairly good.  She denies any thoughts of self-harm or suicidal ideation and her anxiety is under good control. Visit Diagnosis:    ICD-10-CM   1. Generalized anxiety disorder  F41.1     2. 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 episode (HCC)  F32.1       Past Psychiatric History: none  Past Medical History:  Past Medical History:  Diagnosis Date   Acid reflux    Anxiety    Asthma    Depression    Fibromyalgia    Sleep apnea     Past Surgical History:  Procedure Laterality Date   CESAREAN SECTION     CHOLECYSTECTOMY N/A 2021-09-09   Procedure: LAPAROSCOPIC CHOLECYSTECTOMY;  Surgeon: Lewie Chamber, DO;  Location: [facility];  Service: General;  Laterality: N/A;   MOUTH SURGERY     TUBAL LIGATION     tubes tied      Family Psychiatric History: see below  Family History:  Family History  Problem Relation Age of Onset   Hypertension Father    Hypertension Brother    Diabetes Brother    Depression Brother    Anxiety disorder Brother    Stroke Maternal Grandmother    Stroke Paternal Grandfather    Congestive Heart Failure Sister  Diabetes Sister    Hypertension Sister    Anxiety disorder Sister    Depression Sister    Heart disease Unknown    Arthritis Unknown    Cancer Unknown    Asthma Unknown    Diabetes Unknown    Kidney disease Unknown     Social History:  Social History   Socioeconomic History   Marital status: Married    Spouse name: Not on file   Number of children: 2   Years of education: 12   Highest education level: Not on file  Occupational History   Occupation: none    Employer: UNEMPLOYED   Occupation: Unemployed   Tobacco Use   Smoking status: Never   Smokeless tobacco: Never  Vaping Use   Vaping Use: Never used  Substance and Sexual Activity   Alcohol use: No   Drug use: No    Sexual activity: Yes    Birth control/protection: Surgical  Other Topics Concern   Not on file  Social History Narrative   Patient lives at home with husband and son.    Patient does not work   Patient has a high school education    Patient has 2 children.   Social Determinants of Health   Financial Resource Strain: Not on file  Food Insecurity: Not on file  Transportation Needs: Not on file  Physical Activity: Not on file  Stress: Not on file  Social Connections: Not on file    Allergies:  Allergies  Allergen Reactions   Prednisone Other (See Comments)    Depression and crying   Ambien [Zolpidem Tartrate] Other (See Comments)    hallucinations   Penicillins     Metabolic Disorder Labs: No results found for: HGBA1C, MPG No results found for: PROLACTIN No results found for: CHOL, TRIG, HDL, CHOLHDL, VLDL, LDLCALC Lab Results  Component Value Date   TSH 3.190 11/29/2013    Therapeutic Level Labs: No results found for: LITHIUM No results found for: VALPROATE No components found for:  CBMZ  Current Medications: Current Outpatient Medications  Medication Sig Dispense Refill   albuterol (PROVENTIL) (2.5 MG/3ML) 0.083% nebulizer solution Take 2.5 mg by nebulization every 6 (six) hours as needed for wheezing or shortness of breath.     albuterol (VENTOLIN HFA) 108 (90 Base) MCG/ACT inhaler Inhale 2 puffs into the lungs every 4 (four) hours as needed.     ALPRAZolam (XANAX) 1 MG tablet Take 1 tablet (1 mg total) by mouth 3 (three) times daily as needed for anxiety. 90 tablet 2   ARIPiprazole (ABILIFY) 2 MG tablet Take 1 tablet (2 mg total) by mouth every morning. 30 tablet 2   baclofen (LIORESAL) 10 MG tablet Take 10 mg by mouth 3 (three) times daily.     DULoxetine (CYMBALTA) 60 MG capsule Take 1 capsule (60 mg total) by mouth 2 (two) times daily. 60 capsule 2   gabapentin (NEURONTIN) 600 MG tablet Take 600 mg by mouth daily as needed (pain).  1   montelukast (SINGULAIR)  10 MG tablet Take 10 mg by mouth daily.     pantoprazole (PROTONIX) 40 MG tablet Take 40 mg by mouth daily.     temazepam (RESTORIL) 30 MG capsule Take 1 capsule (30 mg total) by mouth at bedtime as needed for sleep. 30 capsule 2   Vitamin D, Ergocalciferol, (DRISDOL) 1.25 MG (50000 UNIT) CAPS capsule Take 50,000 Units by mouth every 7 (seven) days.     No current facility-administered medications for this  visit.     Musculoskeletal: Strength & Muscle Tone: na Gait & Station: na Patient leans: N/A  Psychiatric Specialty Exam: Review of Systems  Gastrointestinal:  Positive for abdominal pain.  All other systems reviewed and are negative.  There were no vitals taken for this visit.There is no height or weight on file to calculate BMI.  General Appearance: NA  Eye Contact:  NA  Speech:  Clear and Coherent  Volume:  Normal  Mood:  Euthymic  Affect:  NA  Thought Process:  Goal Directed  Orientation:  Full (Time, Place, and Person)  Thought Content: Rumination   Suicidal Thoughts:  No  Homicidal Thoughts:  No  Memory:  Immediate;   Good Recent;   Good Remote;   NA  Judgement:  Good  Insight:  Fair  Psychomotor Activity:  Normal  Concentration:  Concentration: Good and Attention Span: Good  Recall:  Good  Fund of Knowledge: Good  Language: Good  Akathisia:  No  Handed:  Right  AIMS (if indicated): not done  Assets:  Communication Skills Desire for Improvement Resilience Social Support Talents/Skills  ADL's:  Intact  Cognition: WNL  Sleep:  Good   Screenings: GAD-7    Flowsheet Row Counselor from [facility]10[facility]2017 [facility]  Total GAD-7 Score 19      PHQ2-9    Flowsheet Row Video Visit from 10/02/2021 in [facility] Counselor from 09/03/2021 in [facility] Video Visit from 08/02/2021 in [facility]  Video Visit from 06/03/2021 in [facility] Video Visit from 04/03/2021 in [facility]  PHQ-2 Total Score 1 3 2  0 0  PHQ-9 Total Score -- 13 6 -- --      Flowsheet Row Video Visit from 10/02/2021 in [facility] Counselor from 09/03/2021 in [facility] Pre-Admission Testing 60 from 08/12/2021 in [facility]  C-SSRS RISK CATEGORY No Risk No Risk No Risk        Assessment and Plan: This patient is a 56 year old female with a history of depression and anxiety.  She has gone through some stressful events recently including her brother's death and her cholecystectomy.  She was not sleeping well but neither was she taking the sleep medication.  Now that she is taking the temazepam 30 mg at bedtime she is sleeping better.  She thinks her mood is fairly stable so we will continue Cymbalta 60 mg twice daily for depression, Abilify 2 mg daily for augmentation and Xanax 1 mg 3 times daily for anxiety.  She will return to see me in 3 months  Collaboration of Care: Collaboration of Care: Referral or follow-up with counselor/therapist AEB patient will continue follow-up with 53 in our office  Patient/Guardian was advised Release of Information must be obtained prior to any record release in order to collaborate their care with an outside provider. Patient/Guardian was advised if they have not already done so to contact the registration department to sign all necessary forms in order for Florencia Reasons to release information regarding their care.   Consent: Patient/Guardian gives verbal consent for treatment and assignment of benefits for services provided during this visit. Patient/Guardian expressed understanding and agreed to proceed.    [place], MD 10/02/2021, 9:34 AM

## 2021-10-07 ENCOUNTER — Ambulatory Visit (HOSPITAL_COMMUNITY): Payer: BC Managed Care – PPO | Admitting: Psychiatry

## 2021-10-07 ENCOUNTER — Telehealth (HOSPITAL_COMMUNITY): Payer: Self-pay | Admitting: Psychiatry

## 2021-10-07 NOTE — Telephone Encounter (Signed)
Therapist attempted to contact patient via phone for scheduled appointment and received voicemail recording.  Therapist left message indicating attempt and requesting patient call office. 

## 2021-11-08 DIAGNOSIS — J302 Other seasonal allergic rhinitis: Secondary | ICD-10-CM | POA: Diagnosis not present

## 2021-11-08 DIAGNOSIS — J45901 Unspecified asthma with (acute) exacerbation: Secondary | ICD-10-CM | POA: Diagnosis not present

## 2021-11-08 DIAGNOSIS — M797 Fibromyalgia: Secondary | ICD-10-CM | POA: Diagnosis not present

## 2021-11-08 DIAGNOSIS — Z6841 Body Mass Index (BMI) 40.0 and over, adult: Secondary | ICD-10-CM | POA: Diagnosis not present

## 2021-12-16 ENCOUNTER — Telehealth (INDEPENDENT_AMBULATORY_CARE_PROVIDER_SITE_OTHER): Payer: BC Managed Care – PPO | Admitting: Psychiatry

## 2021-12-16 ENCOUNTER — Encounter (HOSPITAL_COMMUNITY): Payer: Self-pay | Admitting: Psychiatry

## 2021-12-16 DIAGNOSIS — F321 Major depressive disorder, single episode, moderate: Secondary | ICD-10-CM | POA: Diagnosis not present

## 2021-12-16 DIAGNOSIS — F411 Generalized anxiety disorder: Secondary | ICD-10-CM | POA: Diagnosis not present

## 2021-12-16 MED ORDER — TEMAZEPAM 30 MG PO CAPS: 30.0000 mg | ORAL_CAPSULE | Freq: Every evening | ORAL | 2 refills | Status: DC | PRN

## 2021-12-16 MED ORDER — ARIPIPRAZOLE 2 MG PO TABS: 2.0000 mg | ORAL_TABLET | ORAL | 2 refills | Status: DC

## 2021-12-16 MED ORDER — DULOXETINE HCL 60 MG PO CPEP
60.0000 mg | ORAL_CAPSULE | Freq: Two times a day (BID) | ORAL | 2 refills | Status: DC
Start: 2021-12-16 — End: 2022-03-19

## 2021-12-16 MED ORDER — ALPRAZOLAM 1 MG PO TABS
1.0000 mg | ORAL_TABLET | Freq: Three times a day (TID) | ORAL | 2 refills | Status: DC | PRN
Start: 2021-12-16 — End: 2022-03-19

## 2021-12-16 NOTE — Progress Notes (Signed)
Virtual Visit via Telephone Note  I connected with Virginia Trujillo on 12/16/21 at 10:40 AM EDT by telephone and verified that I am speaking with the correct person using two identifiers.  Location: Patient: home Provider: home office   I discussed the limitations, risks, security and privacy concerns of performing an evaluation and management service by telephone and the availability of in person appointments. I also discussed with the patient that there may be a patient responsible charge related to this service. The patient expressed understanding and agreed to proceed.       I discussed the assessment and treatment plan with the patient. The patient was provided an opportunity to ask questions and all were answered. The patient agreed with the plan and demonstrated an understanding of the instructions.   The patient was advised to call back or seek an in-person evaluation if the symptoms worsen or if the condition fails to improve as anticipated.  I provided 15 minutes of non-face-to-face time during this encounter.   Diannia Ruder, MD  [facility] MD/PA/NP OP Progress Note  12/16/2021 10:57 AM Virginia Trujillo  MRN:  [id number]  Chief Complaint:  Chief Complaint  Patient presents with   Anxiety   Depression   Follow-up   HPI: Patient is a 56 year old married black female lives with her husband and employed.    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  However she decided to quit because her back pain was not getting any better.  She states that she has recently gone to a new clinic for pain management to see what is really causing the problem.  Overall her mood has been stable.  She gets frustrated because she cannot do as much as she would like to do due to the back pain but she is trying to stay active and busy.  She is sleeping well most of the time.  She denies significant depression or anxiety thoughts of self-harm or suicidal ideation.  Her  anxiety is under good control Visit Diagnosis:    ICD-10-CM   1. Generalized anxiety disorder  F41.1     2. 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 episode (HCC)  F32.1       Past Psychiatric History: none  Past Medical History:  Past Medical History:  Diagnosis Date   Acid reflux    Anxiety    Asthma    Depression    Fibromyalgia    Sleep apnea     Past Surgical History:  Procedure Laterality Date   CESAREAN SECTION     CHOLECYSTECTOMY N/A 08/14/2021   Procedure: LAPAROSCOPIC CHOLECYSTECTOMY;  Surgeon: Lewie Chamber, DO;  Location: [facility];  Service: General;  Laterality: N/A;   MOUTH SURGERY     TUBAL LIGATION     tubes tied      Family Psychiatric History: see below  Family History:  Family History  Problem Relation Age of Onset   Hypertension Father    Hypertension Brother    Diabetes Brother    Depression Brother    Anxiety disorder Brother    Stroke Maternal Grandmother    Stroke Paternal Grandfather    Congestive Heart Failure Sister    Diabetes Sister    Hypertension Sister    Anxiety disorder Sister    Depression Sister    Heart disease Unknown    Arthritis Unknown    Cancer Unknown    Asthma Unknown  Diabetes Unknown    Kidney disease Unknown     Social History:  Social History   Socioeconomic History   Marital status: Married    Spouse name: Not on file   Number of children: 2   Years of education: 12   Highest education level: Not on file  Occupational History   Occupation: none    Employer: UNEMPLOYED   Occupation: Unemployed   Tobacco Use   Smoking status: Never   Smokeless tobacco: Never  Vaping Use   Vaping Use: Never used  Substance and Sexual Activity   Alcohol use: No   Drug use: No   Sexual activity: Yes    Birth control/protection: Surgical  Other Topics Concern   Not on file  Social History Narrative   Patient lives at home with husband and son.    Patient does not work    Patient has a high school education    Patient has 2 children.   Social Determinants of Health   Financial Resource Strain: Not on file  Food Insecurity: Not on file  Transportation Needs: Not on file  Physical Activity: Not on file  Stress: Not on file  Social Connections: Not on file    Allergies:  Allergies  Allergen Reactions   Prednisone Other (See Comments)    Depression and crying   Ambien [Zolpidem Tartrate] Other (See Comments)    hallucinations   Penicillins     Metabolic Disorder Labs: No results found for: "HGBA1C", "MPG" No results found for: "PROLACTIN" No results found for: "CHOL", "TRIG", "HDL", "CHOLHDL", "VLDL", "LDLCALC" Lab Results  Component Value Date   TSH 3.190 11/29/2013    Therapeutic Level Labs: No results found for: "LITHIUM" No results found for: "VALPROATE" No results found for: "CBMZ"  Current Medications: Current Outpatient Medications  Medication Sig Dispense Refill   albuterol (PROVENTIL) (2.5 MG/3ML) 0.083% nebulizer solution Take 2.5 mg by nebulization every 6 (six) hours as needed for wheezing or shortness of breath.     albuterol (VENTOLIN HFA) 108 (90 Base) MCG/ACT inhaler Inhale 2 puffs into the lungs every 4 (four) hours as needed.     ALPRAZolam (XANAX) 1 MG tablet Take 1 tablet (1 mg total) by mouth 3 (three) times daily as needed for anxiety. 90 tablet 2   ARIPiprazole (ABILIFY) 2 MG tablet Take 1 tablet (2 mg total) by mouth every morning. 30 tablet 2   baclofen (LIORESAL) 10 MG tablet Take 10 mg by mouth 3 (three) times daily.     DULoxetine (CYMBALTA) 60 MG capsule Take 1 capsule (60 mg total) by mouth 2 (two) times daily. 60 capsule 2   montelukast (SINGULAIR) 10 MG tablet Take 10 mg by mouth daily.     pantoprazole (PROTONIX) 40 MG tablet Take 40 mg by mouth daily.     temazepam (RESTORIL) 30 MG capsule Take 1 capsule (30 mg total) by mouth at bedtime as needed for sleep. 30 capsule 2   traMADol-acetaminophen (ULTRACET)  37.5-325 MG tablet Take 2 tablets by mouth every 6 (six) hours.     Vitamin D, Ergocalciferol, (DRISDOL) 1.25 MG (50000 UNIT) CAPS capsule Take 50,000 Units by mouth every 7 (seven) days.     No current facility-administered medications for this visit.     Musculoskeletal: Strength & Muscle Tone: na Gait & Station: na Patient leans: N/A  Psychiatric Specialty Exam: Review of Systems  Musculoskeletal:  Positive for back pain.  All other systems reviewed and are negative.   There  were no vitals taken for this visit.There is no height or weight on file to calculate BMI.  General Appearance: NA  Eye Contact:  NA  Speech:  Clear and Coherent  Volume:  Normal  Mood:  Euthymic  Affect:  NA  Thought Process:  Goal Directed  Orientation:  Full (Time, Place, and Person)  Thought Content: WDL   Suicidal Thoughts:  No  Homicidal Thoughts:  No  Memory:  Immediate;   Good Recent;   Good Remote;   Fair  Judgement:  Good  Insight:  Fair  Psychomotor Activity:  Decreased  Concentration:  Concentration: Good and Attention Span: Good  Recall:  Good  Fund of Knowledge: Good  Language: Good  Akathisia:  No  Handed:  Right  AIMS (if indicated): not done  Assets:  Communication Skills Desire for Improvement Resilience Social Support Talents/Skills  ADL's:  Intact  Cognition: WNL  Sleep:  Good   Screenings: GAD-7    Flowsheet Row Counselor from 11/10/2017 in [facility]  Total GAD-7 Score 19      PHQ2-9    Flowsheet Row Video Visit from 12/16/2021 in [facility] Video Visit from 10/02/2021 in [facility] Counselor from 09/03/2021 in [facility] Video Visit from 08/02/2021 in [facility] Video Visit from 06/03/2021 in [facility]   PHQ-2 Total Score 1 1 3 2  0  PHQ-9 Total Score -- -- 13 6 --      Flowsheet Row Video Visit from 12/16/2021 in [facility] Video Visit from 10/02/2021 in [facility] Counselor from 09/03/2021 in [facility]  C-SSRS RISK CATEGORY No Risk No Risk No Risk        Assessment and Plan: This patient is a 56 year old female with a history of depression and anxiety.  She is doing fairly well on her current regimen.  She will continue Cymbalta 60 mg twice daily for depression, Abilify 2 mg daily for augmentation, Xanax 1 mg 3 times daily for anxiety and temazepam 30 mg at bedtime for sleep.  She knows not to combine the Xanax or temazepam with tramadol.  She will return to see me in 3 months  Collaboration of Care: Collaboration of Care: Primary Care Provider AEB notes will be shared with PCP at patient's request  Patient/Guardian was advised Release of Information must be obtained prior to any record release in order to collaborate their care with an outside provider. Patient/Guardian was advised if they have not already done so to contact the registration department to sign all necessary forms in order for 53 to release information regarding their care.   Consent: Patient/Guardian gives verbal consent for treatment and assignment of benefits for services provided during this visit. Patient/Guardian expressed understanding and agreed to proceed.    [place], MD 12/16/2021, 10:57 AM

## 2022-03-14 DIAGNOSIS — J45901 Unspecified asthma with (acute) exacerbation: Secondary | ICD-10-CM | POA: Diagnosis not present

## 2022-03-14 DIAGNOSIS — F419 Anxiety disorder, unspecified: Secondary | ICD-10-CM | POA: Diagnosis not present

## 2022-03-14 DIAGNOSIS — Z6841 Body Mass Index (BMI) 40.0 and over, adult: Secondary | ICD-10-CM | POA: Diagnosis not present

## 2022-03-14 DIAGNOSIS — M797 Fibromyalgia: Secondary | ICD-10-CM | POA: Diagnosis not present

## 2022-03-19 ENCOUNTER — Telehealth (INDEPENDENT_AMBULATORY_CARE_PROVIDER_SITE_OTHER): Payer: BC Managed Care – PPO | Admitting: Psychiatry

## 2022-03-19 ENCOUNTER — Encounter (HOSPITAL_COMMUNITY): Payer: Self-pay | Admitting: Psychiatry

## 2022-03-19 DIAGNOSIS — F321 Major depressive disorder, single episode, moderate: Secondary | ICD-10-CM | POA: Diagnosis not present

## 2022-03-19 DIAGNOSIS — F411 Generalized anxiety disorder: Secondary | ICD-10-CM | POA: Diagnosis not present

## 2022-03-19 MED ORDER — ZOLPIDEM TARTRATE 10 MG PO TABS
10.0000 mg | ORAL_TABLET | Freq: Every evening | ORAL | 2 refills | Status: DC | PRN
Start: 2022-03-19 — End: 2022-06-19

## 2022-03-19 MED ORDER — ARIPIPRAZOLE 2 MG PO TABS
2.0000 mg | ORAL_TABLET | ORAL | 2 refills | Status: DC
Start: 2022-03-19 — End: 2022-06-19

## 2022-03-19 MED ORDER — DULOXETINE HCL 60 MG PO CPEP: 60.0000 mg | ORAL_CAPSULE | Freq: Two times a day (BID) | ORAL | 2 refills | Status: DC

## 2022-03-19 MED ORDER — ALPRAZOLAM 1 MG PO TABS: 1.0000 mg | ORAL_TABLET | Freq: Three times a day (TID) | ORAL | 2 refills | Status: DC | PRN

## 2022-03-19 NOTE — Progress Notes (Signed)
Virtual Visit via Telephone Note  I connected with Virginia Trujillo on 03/19/22 at  9:00 AM EST by telephone and verified that I am speaking with the correct person using two identifiers.  Location: Patient: home Provider: office   I discussed the limitations, risks, security and privacy concerns of performing an evaluation and management service by telephone and the availability of in person appointments. I also discussed with the patient that there may be a patient responsible charge related to this service. The patient expressed understanding and agreed to proceed.       I discussed the assessment and treatment plan with the patient. The patient was provided an opportunity to ask questions and all were answered. The patient agreed with the plan and demonstrated an understanding of the instructions.   The patient was advised to call back or seek an in-person evaluation if the symptoms worsen or if the condition fails to improve as anticipated.  I provided 15 minutes of non-face-to-face time during this encounter.   Virginia Ruder, MD  [facility] MD/PA/NP OP Progress Note  03/19/2022 9:24 AM [name]  MRN:  [id number]  Chief Complaint:  Chief Complaint  Patient presents with   Depression   Anxiety   Follow-up   HPI: This patient is a 56 year old married black female who lives with her husband in [place].  She is currently unemployed.  The patient states that she has been having a lot of knee pain and back pain.  She is going to a clinic in [place] and has been getting injections in her back and also some form of physical therapy.  However her knees are hurting so much that she cannot participate in the physical therapy.  I suggested she get a referral to an orthopedic.  The patient claims that she has been a bit more depressed recently because her father's health is declining.  He has colon cancer and is not getting active treatment due to his advanced age but does have a colostomy  bag.  She worries about them a lot.  He lives nearby and she is able to see them frequently.  She denies any thoughts of self-harm or suicide and her mood is fairly stable with the medication.  She is not sleeping as well as she would like and would like to retry Ambien. Visit Diagnosis:    ICD-10-CM   1. Generalized anxiety disorder  F41.1     2. 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 episode (HCC)  F32.1       Past Psychiatric History: none  Past Medical History:  Past Medical History:  Diagnosis Date   Acid reflux    Anxiety    Asthma    Depression    Fibromyalgia    Sleep apnea     Past Surgical History:  Procedure Laterality Date   CESAREAN SECTION     CHOLECYSTECTOMY N/A 08/14/2021   Procedure: LAPAROSCOPIC CHOLECYSTECTOMY;  Surgeon: Lewie Chamber, DO;  Location: [facility];  Service: General;  Laterality: N/A;   MOUTH SURGERY     TUBAL LIGATION     tubes tied      Family Psychiatric History: See below  Family History:  Family History  Problem Relation Age of Onset   Hypertension Father    Hypertension Brother    Diabetes Brother    Depression Brother    Anxiety disorder Brother    Stroke Maternal Grandmother    Stroke Paternal Grandfather    Congestive  Heart Failure Sister    Diabetes Sister    Hypertension Sister    Anxiety disorder Sister    Depression Sister    Heart disease Unknown    Arthritis Unknown    Cancer Unknown    Asthma Unknown    Diabetes Unknown    Kidney disease Unknown     Social History:  Social History   Socioeconomic History   Marital status: Married    Spouse name: Not on file   Number of children: 2   Years of education: 12   Highest education level: Not on file  Occupational History   Occupation: none    Employer: UNEMPLOYED   Occupation: Unemployed   Tobacco Use   Smoking status: Never   Smokeless tobacco: Never  Vaping Use   Vaping Use: Never used  Substance and Sexual Activity    Alcohol use: No   Drug use: No   Sexual activity: Yes    Birth control/protection: Surgical  Other Topics Concern   Not on file  Social History Narrative   Patient lives at home with husband and son.    Patient does not work   Patient has a high school education    Patient has 2 children.   Social Determinants of Health   Financial Resource Strain: Not on file  Food Insecurity: Not on file  Transportation Needs: Not on file  Physical Activity: Not on file  Stress: Not on file  Social Connections: Not on file    Allergies:  Allergies  Allergen Reactions   Prednisone Other (See Comments)    Depression and crying   Ambien [Zolpidem Tartrate] Other (See Comments)    hallucinations   Penicillins     Metabolic Disorder Labs: No results found for: "HGBA1C", "MPG" No results found for: "PROLACTIN" No results found for: "CHOL", "TRIG", "HDL", "CHOLHDL", "VLDL", "LDLCALC" Lab Results  Component Value Date   TSH 3.190 11/29/2013    Therapeutic Level Labs: No results found for: "LITHIUM" No results found for: "VALPROATE" No results found for: "CBMZ"  Current Medications: Current Outpatient Medications  Medication Sig Dispense Refill   zolpidem (AMBIEN) 10 MG tablet Take 1 tablet (10 mg total) by mouth at bedtime as needed for sleep. 30 tablet 2   albuterol (PROVENTIL) (2.5 MG/3ML) 0.083% nebulizer solution Take 2.5 mg by nebulization every 6 (six) hours as needed for wheezing or shortness of breath.     albuterol (VENTOLIN HFA) 108 (90 Base) MCG/ACT inhaler Inhale 2 puffs into the lungs every 4 (four) hours as needed.     ALPRAZolam (XANAX) 1 MG tablet Take 1 tablet (1 mg total) by mouth 3 (three) times daily as needed for anxiety. 90 tablet 2   ARIPiprazole (ABILIFY) 2 MG tablet Take 1 tablet (2 mg total) by mouth every morning. 30 tablet 2   baclofen (LIORESAL) 10 MG tablet Take 10 mg by mouth 3 (three) times daily.     DULoxetine (CYMBALTA) 60 MG capsule Take 1  capsule (60 mg total) by mouth 2 (two) times daily. 60 capsule 2   montelukast (SINGULAIR) 10 MG tablet Take 10 mg by mouth daily.     pantoprazole (PROTONIX) 40 MG tablet Take 40 mg by mouth daily.     traMADol-acetaminophen (ULTRACET) 37.5-325 MG tablet Take 2 tablets by mouth every 6 (six) hours.     Vitamin D, Ergocalciferol, (DRISDOL) 1.25 MG (50000 UNIT) CAPS capsule Take 50,000 Units by mouth every 7 (seven) days.     No  current facility-administered medications for this visit.     Musculoskeletal: Strength & Muscle Tone: na Gait & Station: na Patient leans: N/A  Psychiatric Specialty Exam: Review of Systems  Musculoskeletal:  Positive for arthralgias, back pain and myalgias.  Psychiatric/Behavioral:  Positive for sleep disturbance. The patient is nervous/anxious.   All other systems reviewed and are negative.   There were no vitals taken for this visit.There is no height or weight on file to calculate BMI.  General Appearance: NA  Eye Contact:  NA  Speech:  Clear and Coherent  Volume:  Normal  Mood:  Anxious and Euthymic  Affect:  NA  Thought Process:  Goal Directed  Orientation:  Full (Time, Place, and Person)  Thought Content: WDL   Suicidal Thoughts:  No  Homicidal Thoughts:  No  Memory:  Immediate;   Good Recent;   Good Remote;   Fair  Judgement:  Good  Insight:  Fair  Psychomotor Activity:  Decreased  Concentration:  Concentration: Good and Attention Span: Good  Recall:  Good  Fund of Knowledge: Good  Language: Good  Akathisia:  No  Handed:  Right  AIMS (if indicated): not done  Assets:  Communication Skills Desire for Improvement Resilience Social Support  ADL's:  Intact  Cognition: WNL  Sleep:  Poor   Screenings: GAD-7    Flowsheet Row Counsel[facility]  Total GAD-7 Score 19      PHQ2-9    Flowsheet Row Video Visit from [facility]/16[facility]2021 [facility] Video Visit from 10/02/2021 in [facility] Counselor from 09/03/2021 in [facility] Video Visit from 08/02/2021 in [facility] Video Visit from 06/03/2021 in [facility]  PHQ-2 Total Score 1 1 3 2  0  PHQ-9 Total Score -- -- 13 6 --      Flowsheet Row Video Visit from 12/16/2021 in [facility] Video Visit from 10/02/2021 in [facility] Counselor from 09/03/2021 in [facility]  C-SSRS RISK CATEGORY No Risk No Risk No Risk        Assessment and Plan: This patient is a 56 year old female with a history of depression and anxiety.  She is worried about her father's health but in general is doing fairly well.  She will continue Cymbalta 60 mg twice daily for depression, Abilify 2 mg daily for augmentation, Xanax 1 mg 3 times daily for anxiety.  Since she is not sleeping well on the temazepam we will switch to Ambien 10 mg at bedtime.  She will return to see me in 3 months  Collaboration of Care: Collaboration of Care: Primary Care Provider AEB notes will be shared with PCP at patient's request  Patient/Guardian was advised Release of Information must be obtained prior to any record release in order to collaborate their care with an outside provider. Patient/Guardian was advised if they have not already done so to contact the registration department to sign all necessary forms in order for 59 to release information regarding their care.   Consent: Patient/Guardian gives verbal consent for treatment and assignment of benefits for services provided during this visit. Patient/Guardian expressed understanding and agreed to proceed.    [place], MD 03/19/2022, 9:24 AM

## 2022-05-07 ENCOUNTER — Ambulatory Visit (HOSPITAL_COMMUNITY): Payer: BC Managed Care – PPO | Admitting: Psychiatry

## 2022-05-08 DIAGNOSIS — Z6841 Body Mass Index (BMI) 40.0 and over, adult: Secondary | ICD-10-CM | POA: Diagnosis not present

## 2022-05-08 DIAGNOSIS — M797 Fibromyalgia: Secondary | ICD-10-CM | POA: Diagnosis not present

## 2022-05-08 DIAGNOSIS — K644 Residual hemorrhoidal skin tags: Secondary | ICD-10-CM | POA: Diagnosis not present

## 2022-05-08 DIAGNOSIS — M542 Cervicalgia: Secondary | ICD-10-CM | POA: Diagnosis not present

## 2022-06-19 ENCOUNTER — Encounter (HOSPITAL_COMMUNITY): Payer: Self-pay | Admitting: Psychiatry

## 2022-06-19 ENCOUNTER — Telehealth (HOSPITAL_COMMUNITY): Payer: BC Managed Care – PPO | Admitting: Psychiatry

## 2022-06-19 DIAGNOSIS — F411 Generalized anxiety disorder: Secondary | ICD-10-CM | POA: Diagnosis not present

## 2022-06-19 DIAGNOSIS — F321 Major depressive disorder, single episode, moderate: Secondary | ICD-10-CM | POA: Diagnosis not present

## 2022-06-19 MED ORDER — DULOXETINE HCL 60 MG PO CPEP: 60.0000 mg | ORAL_CAPSULE | Freq: Two times a day (BID) | ORAL | 2 refills | Status: DC

## 2022-06-19 MED ORDER — ZOLPIDEM TARTRATE 10 MG PO TABS: 10.0000 mg | ORAL_TABLET | Freq: Every evening | ORAL | 2 refills | Status: DC | PRN

## 2022-06-19 MED ORDER — ALPRAZOLAM 1 MG PO TABS: 1.0000 mg | ORAL_TABLET | Freq: Three times a day (TID) | ORAL | 2 refills | Status: DC | PRN

## 2022-06-19 MED ORDER — ARIPIPRAZOLE 2 MG PO TABS: 2.0000 mg | ORAL_TABLET | ORAL | 2 refills | Status: DC

## 2022-06-19 NOTE — Progress Notes (Signed)
Virtual Visit via Telephone Note  I connected with Virginia Trujillo on 06/19/22 at  9:20 AM EST by telephone and verified that I am speaking with the correct person using two identifiers.  Location: Patient: home Provider: office   I discussed the limitations, risks, security and privacy concerns of performing an evaluation and management service by telephone and the availability of in person appointments. I also discussed with the patient that there may be a patient responsible charge related to this service. The patient expressed understanding and agreed to proceed.      I discussed the assessment and treatment plan with the patient. The patient was provided an opportunity to ask questions and all were answered. The patient agreed with the plan and demonstrated an understanding of the instructions.   The patient was advised to call back or seek an in-person evaluation if the symptoms worsen or if the condition fails to improve as anticipated.  I provided 15 minutes of non-face-to-face time during this encounter.   Levonne Spiller, MD  [facility] MD/PA/NP OP Progress Note  06/19/2022 9:34 AM Virginia Trujillo  MRN:  [id number]  Chief Complaint:  Chief Complaint  Patient presents with   Depression   Anxiety   Follow-up   HPI: This patient is a 57 year old married black female who lives with her husband in [place].  She is currently unemployed.  The patient returns after 3 months regarding her symptoms of depression and anxiety.  In terms of the symptoms she has been doing fairly well.  At times she gets more depressed particularly when thinking about her brother who died last 08/04/22 of sepsis.  Right now however she is dealing with having a bad sinus headache as well as chest pain.  She is planning on going to the urgent care as soon as we are done with our appointment.  She is not having significant trouble breathing or radiating chest pain.  Overall she is sleeping well and her mood has been stable  and she denies any thoughts of self-harm or suicide. Visit Diagnosis:    ICD-10-CM   1. 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 episode ([place])  F32.1     2. Generalized anxiety disorder  F41.1       Past Psychiatric History: none  Past Medical History:  Past Medical History:  Diagnosis Date   Acid reflux    Anxiety    Asthma    Depression    Fibromyalgia    Sleep apnea     Past Surgical History:  Procedure Laterality Date   CESAREAN SECTION     CHOLECYSTECTOMY N/A 08/14/2021   Procedure: LAPAROSCOPIC CHOLECYSTECTOMY;  Surgeon: Rusty Aus, DO;  Location: [facility];  Service: General;  Laterality: N/A;   MOUTH SURGERY     TUBAL LIGATION     tubes tied      Family Psychiatric History: See below  Family History:  Family History  Problem Relation Age of Onset   Hypertension Father    Hypertension Brother    Diabetes Brother    Depression Brother    Anxiety disorder Brother    Stroke Maternal Grandmother    Stroke Paternal Grandfather    Congestive Heart Failure Sister    Diabetes Sister    Hypertension Sister    Anxiety disorder Sister    Depression Sister    Heart disease Unknown    Arthritis Unknown    Cancer Unknown    Asthma Unknown  Diabetes Unknown    Kidney disease Unknown     Social History:  Social History   Socioeconomic History   Marital status: Married    Spouse name: Not on file   Number of children: 2   Years of education: 12   Highest education level: Not on file  Occupational History   Occupation: none    Employer: UNEMPLOYED   Occupation: Unemployed   Tobacco Use   Smoking status: Never   Smokeless tobacco: Never  Vaping Use   Vaping Use: Never used  Substance and Sexual Activity   Alcohol use: No   Drug use: No   Sexual activity: Yes    Birth control/protection: Surgical  Other Topics Concern   Not on file  Social History Narrative   Patient lives at home with husband and son.     Patient does not work   Patient has a high school education    Patient has 2 children.   Social Determinants of Health   Financial Resource Strain: Not on file  Food Insecurity: Not on file  Transportation Needs: Not on file  Physical Activity: Not on file  Stress: Not on file  Social Connections: Not on file    Allergies:  Allergies  Allergen Reactions   Prednisone Other (See Comments)    Depression and crying   Ambien [Zolpidem Tartrate] Other (See Comments)    hallucinations   Penicillins     Metabolic Disorder Labs: No results found for: "HGBA1C", "MPG" No results found for: "PROLACTIN" No results found for: "CHOL", "TRIG", "HDL", "CHOLHDL", "VLDL", "LDLCALC" Lab Results  Component Value Date   TSH 3.190 11/29/2013    Therapeutic Level Labs: No results found for: "LITHIUM" No results found for: "VALPROATE" No results found for: "CBMZ"  Current Medications: Current Outpatient Medications  Medication Sig Dispense Refill   albuterol (PROVENTIL) (2.5 MG/3ML) 0.083% nebulizer solution Take 2.5 mg by nebulization every 6 (six) hours as needed for wheezing or shortness of breath.     albuterol (VENTOLIN HFA) 108 (90 Base) MCG/ACT inhaler Inhale 2 puffs into the lungs every 4 (four) hours as needed.     ALPRAZolam (XANAX) 1 MG tablet Take 1 tablet (1 mg total) by mouth 3 (three) times daily as needed for anxiety. 90 tablet 2   ARIPiprazole (ABILIFY) 2 MG tablet Take 1 tablet (2 mg total) by mouth every morning. 30 tablet 2   baclofen (LIORESAL) 10 MG tablet Take 10 mg by mouth 3 (three) times daily.     DULoxetine (CYMBALTA) 60 MG capsule Take 1 capsule (60 mg total) by mouth 2 (two) times daily. 60 capsule 2   montelukast (SINGULAIR) 10 MG tablet Take 10 mg by mouth daily.     pantoprazole (PROTONIX) 40 MG tablet Take 40 mg by mouth daily.     traMADol-acetaminophen (ULTRACET) 37.5-325 MG tablet Take 2 tablets by mouth every 6 (six) hours.     Vitamin D, Ergocalciferol,  (DRISDOL) 1.25 MG (50000 UNIT) CAPS capsule Take 50,000 Units by mouth every 7 (seven) days.     zolpidem (AMBIEN) 10 MG tablet Take 1 tablet (10 mg total) by mouth at bedtime as needed for sleep. 30 tablet 2   No current facility-administered medications for this visit.     Musculoskeletal: Strength & Muscle Tone: na Gait & Station: na Patient leans: N/A  Psychiatric Specialty Exam: Review of Systems  Cardiovascular:  Positive for chest pain.  Neurological:  Positive for headaches.  All other systems reviewed  and are negative.   There were no vitals taken for this visit.There is no height or weight on file to calculate BMI.  General Appearance: NA  Eye Contact:  NA  Speech:  Clear and Coherent  Volume:  Normal  Mood:  Euthymic  Affect:  NA  Thought Process:  Goal Directed  Orientation:  Full (Time, Place, and Person)  Thought Content: WDL   Suicidal Thoughts:  No  Homicidal Thoughts:  No  Memory:  Immediate;   Good Recent;   Good Remote;   NA  Judgement:  Good  Insight:  Fair  Psychomotor Activity:  Decreased  Concentration:  Concentration: Good and Attention Span: Good  Recall:  Good  Fund of Knowledge: Good  Language: Good  Akathisia:  No  Handed:  Right  AIMS (if indicated): not done  Assets:  Communication Skills Desire for Improvement Resilience Social Support  ADL's:  Intact  Cognition: WNL  Sleep:  Good   Screenings: GAD-7    Health and safety inspector from 11/10/2017 in [place] at [place]  Total GAD-7 Score 19      PHQ2-9    Flowsheet Row Video Visit from 12/16/2021 in [place] at [place] Video Visit from 10/02/2021 in [place] at [place] from 09/03/2021 in [place] at [place] Video Visit from 08/02/2021 in [place] at [place] Video Visit from 06/03/2021 in [place] at [facility] Total Score 1 1 3 2 $ 0  PHQ-9 Total Score -- -- 13 6 --      Flowsheet Row Video Visit from 12/16/2021 in [place] at [place] Video Visit from 10/02/2021 in [place] at [place] from 09/03/2021 in [place] at [place] No Risk No Risk No Risk        Assessment and Plan: This patient is a 57 year old female with a history of depression and anxiety.  She continues to do well on her current regimen.  She will continue Cymbalta 60 mg twice daily for depression, Abilify 2 mg daily for augmentation, Xanax 1 mg up to 3 times daily for anxiety and Ambien 10 mg at bedtime for sleep.  She will return to see me in 3 months  Collaboration of Care: Collaboration of Care: Primary Care Provider AEB notes will be shared with PCP at patient's request  Patient/Guardian was advised Release of Information must be obtained prior to any record release in order to collaborate their care with an outside provider. Patient/Guardian was advised if they have not already done so to contact the registration department to sign all necessary forms in order for [place] to release information regarding their care.   Consent: Patient/Guardian gives verbal consent for treatment and assignment of benefits for services provided during this visit. Patient/Guardian expressed understanding and agreed to proceed.    Levonne Spiller, MD 06/19/2022, 9:34 AM

## 2022-06-21 ENCOUNTER — Emergency Department (HOSPITAL_COMMUNITY): Payer: BC Managed Care – PPO

## 2022-06-21 ENCOUNTER — Encounter (HOSPITAL_COMMUNITY): Payer: Self-pay | Admitting: Pharmacy Technician

## 2022-06-21 ENCOUNTER — Other Ambulatory Visit: Payer: Self-pay

## 2022-06-21 ENCOUNTER — Emergency Department (HOSPITAL_COMMUNITY)
Admission: EM | Admit: 2022-06-21 | Discharge: 2022-06-21 | Disposition: A | Discharge status: Home / Self Care | Payer: BC Managed Care – PPO | Attending: Emergency Medicine | Admitting: Emergency Medicine

## 2022-06-21 DIAGNOSIS — R079 Chest pain, unspecified: Secondary | ICD-10-CM | POA: Diagnosis not present

## 2022-06-21 DIAGNOSIS — R0602 Shortness of breath: Secondary | ICD-10-CM | POA: Diagnosis not present

## 2022-06-21 DIAGNOSIS — M79622 Pain in left upper arm: Secondary | ICD-10-CM | POA: Insufficient documentation

## 2022-06-21 DIAGNOSIS — R7989 Other specified abnormal findings of blood chemistry: Secondary | ICD-10-CM | POA: Diagnosis not present

## 2022-06-21 LAB — BASIC METABOLIC PANEL
Anion gap: 7 (ref 5–15)
BUN: 11 mg/dL (ref 6–20)
CO2: 25 mmol/L (ref 22–32)
Calcium: 8.6 mg/dL — ABNORMAL LOW (ref 8.9–10.3)
Chloride: 105 mmol/L (ref 98–111)
Creatinine, Ser: 0.99 mg/dL (ref 0.44–1.00)
GFR, Estimated: >60 mL/min (ref >60)
Glucose, Bld: 140 mg/dL — ABNORMAL HIGH (ref 70–99)
Potassium: 4 mmol/L (ref 3.5–5.1)
Sodium: 137 mmol/L (ref 135–145)

## 2022-06-21 LAB — CBC
HCT: 38.8 % (ref 36.0–46.0)
Hemoglobin: 12.7 g/dL (ref 12.0–15.0)
MCH: 32.4 pg (ref 26.0–34.0)
MCHC: 32.7 g/dL (ref 30.0–36.0)
MCV: 99 fL (ref 80.0–100.0)
Platelets: 164 10*3/uL (ref 150–400)
RBC: 3.92 MIL/uL (ref 3.87–5.11)
RDW: 12.3 % (ref 11.5–15.5)
WBC: 7.4 10*3/uL (ref 4.0–10.5)
nRBC: 0 % (ref 0.0–0.2)

## 2022-06-21 LAB — D-DIMER, QUANTITATIVE: D-Dimer, Quant: 0.58 ug/mL-FEU — ABNORMAL HIGH (ref 0.00–0.50)

## 2022-06-21 LAB — TROPONIN I (HIGH SENSITIVITY): Troponin I (High Sensitivity): <2 ng/L (ref <18)

## 2022-06-21 MED ORDER — ALUM & MAG HYDROXIDE-SIMETH 200-200-20 MG/5ML PO SUSP: 15.0000 mL | Freq: Once | ORAL | Status: DC

## 2022-06-21 MED ORDER — LIDOCAINE 5 % EX PTCH
1.0000 | MEDICATED_PATCH | CUTANEOUS | Status: DC
  Administered 2022-06-21: 1 via TRANSDERMAL
  Filled 2022-06-21: qty 1

## 2022-06-21 MED ORDER — IOHEXOL 350 MG/ML SOLN
100.0000 mL | Freq: Once | INTRAVENOUS | Status: AC | PRN
  Administered 2022-06-21: 100 mL via INTRAVENOUS

## 2022-06-21 MED ORDER — ACETAMINOPHEN 325 MG PO TABS
650.0000 mg | ORAL_TABLET | Freq: Once | ORAL | Status: AC
  Administered 2022-06-21: 650 mg via ORAL
  Filled 2022-06-21: qty 2

## 2022-06-21 NOTE — Discharge Instructions (Signed)
You are seen today for chest pain.  Your workup was very reassuring.  There are no signs of cardiac, your CT scan was negative for blood clots in your lung or other causes of chest pain such as pneumonia or collapsed lung. It is concerning that you are having shortness of breath when he walks so is very importantly follow-up with your primary care doctor and the heart doctor.  If you develop worsening pain, other new or worrisome symptoms come back to the ER right away.

## 2022-06-21 NOTE — ED Notes (Signed)
Patient Alert and oriented to baseline. Stable and ambulatory to baseline. Patient verbalized understanding of the discharge instructions.  Patient belongings were taken by the patient.   

## 2022-06-21 NOTE — ED Provider Notes (Signed)
[facility] Provider Note   CSN: [id number] Arrival date & time: 06/21/22  [id number]     History  Chief Complaint  Patient presents with   Chest Pain    Virginia Trujillo is a 57 y.o. female.  History of depression anxiety, fibromyalgia and acid reflux.  She presents the ED for chest pain that started about 2 weeks ago but she states got worse today.  Past 2 days she has woken up with pressure in the left lower chest pain going down the left arm described as feeling like her left arm is swollen.  Orts she has been short of breath the past 2 days as well and this gets worse when she exerts herself.  She states she was sweaty when she woke up this morning with this pain.  She thought it was due to her acid reflux and she tried to get her PCP but when her symptoms got worse she decided to come to the ED. She denies history of hypertension, diabetes high cholesterol, does not smoke.  She also reports she has been having some pain diffusely in the right leg today that seems to start in the thigh and shoots down the leg and hand swelling to the right ankle yesterday that now seems to be improved.  She denies any history of VTE, she is not on any hormone medications such as birth control or HRT.     Chest Pain      Home Medications Prior to Admission medications   Medication Sig Start Date End Date Taking? Authorizing Provider  albuterol (PROVENTIL) (2.5 MG/3ML) 0.083% nebulizer solution Take 2.5 mg by nebulization every 6 (six) hours as needed for wheezing or shortness of breath.   Yes [provider]  albuterol (VENTOLIN HFA) 108 (90 Base) MCG/ACT inhaler Inhale 2 puffs into the lungs every 4 (four) hours as needed for wheezing or shortness of breath. 06/06/21  Yes [provider]  ALPRAZolam Duanne Moron) 1 MG tablet Take 1 tablet (1 mg total) by mouth 3 (three) times daily as needed for anxiety. 06/19/22 06/19/23 Yes Cloria Spring, MD   ARIPiprazole (ABILIFY) 2 MG tablet Take 1 tablet (2 mg total) by mouth every morning. 06/19/22  Yes Cloria Spring, MD  baclofen (LIORESAL) 10 MG tablet Take 10 mg by mouth 3 (three) times daily.   Yes [provider]  cholecalciferol (VITAMIN D3) 25 MCG (1000 UNIT) tablet Take 1,000 Units by mouth daily.   Yes [provider]  DULoxetine (CYMBALTA) 60 MG capsule Take 1 capsule (60 mg total) by mouth 2 (two) times daily. 06/19/22  Yes Cloria Spring, MD  montelukast (SINGULAIR) 10 MG tablet Take 10 mg by mouth daily. 08/02/21  Yes [provider]  pantoprazole (PROTONIX) 40 MG tablet Take 40 mg by mouth daily. 08/02/21  Yes [provider]  traMADol-acetaminophen (ULTRACET) 37.5-325 MG tablet Take 2 tablets by mouth every 6 (six) hours as needed for moderate pain or severe pain. 12/04/21  Yes [provider]      Allergies    Prednisone, Ambien [zolpidem tartrate], and Penicillins    Review of Systems   Review of Systems  Cardiovascular:  Positive for chest pain.    Physical Exam Updated Vital Signs BP (!) 161/94   Pulse 64   Temp 98.3 F (36.8 C) (Oral)   Resp 18   Ht 5' 3"$  (1.6 m)   Wt 118.8 kg   SpO2 100%  BMI 46.41 kg/m  Physical Exam Vitals and nursing note reviewed.  Constitutional:      General: She is not in acute distress.    Appearance: She is well-developed. She is morbidly obese.  HENT:     Head: Normocephalic and atraumatic.  Eyes:     Conjunctiva/sclera: Conjunctivae normal.  Cardiovascular:     Rate and Rhythm: Normal rate and regular rhythm.     Heart sounds: No murmur heard. Pulmonary:     Effort: Pulmonary effort is normal. No respiratory distress.     Breath sounds: Normal breath sounds.  Chest:     Chest wall: No tenderness.  Abdominal:     Palpations: Abdomen is soft.     Tenderness: There is no abdominal tenderness.  Musculoskeletal:        General: No swelling.     Right upper arm: Normal. No  swelling or edema.     Left upper arm: Tenderness present. No swelling, edema or bony tenderness.     Cervical back: Neck supple.  Skin:    General: Skin is warm and dry.     Capillary Refill: Capillary refill takes less than 2 seconds.  Neurological:     Mental Status: She is alert.  Psychiatric:        Mood and Affect: Mood normal.     ED Results / Procedures / Treatments   Labs (all labs ordered are listed, but only abnormal results are displayed) Labs Reviewed  BASIC METABOLIC PANEL - Abnormal; Notable for the following components:      Result Value   Glucose, Bld 140 (*)    Calcium 8.6 (*)    All other components within normal limits  D-DIMER, QUANTITATIVE - Abnormal; Notable for the following components:   D-Dimer, Quant 0.58 (*)    All other components within normal limits  CBC  TROPONIN I (HIGH SENSITIVITY)    EKG None  Radiology CT Angio Chest PE W and/or Wo Contrast  Result Date: 06/21/2022 CLINICAL DATA:  Pulmonary embolism suspected.  Positive D-dimer. EXAM: CT ANGIOGRAPHY CHEST WITH CONTRAST TECHNIQUE: Multidetector CT imaging of the chest was performed using the standard protocol during bolus administration of intravenous contrast. Multiplanar CT image reconstructions and MIPs were obtained to evaluate the vascular anatomy. RADIATION DOSE REDUCTION: This exam was performed according to the departmental dose-optimization program which includes automated exposure control, adjustment of the mA and/or kV according to patient size and/or use of iterative reconstruction technique. CONTRAST:  144m OMNIPAQUE IOHEXOL 350 MG/ML SOLN COMPARISON:  None Available. FINDINGS: Cardiovascular: Satisfactory opacification of the pulmonary arteries to the segmental level. No evidence of pulmonary embolism. Normal heart size. No pericardial effusion. Mediastinum/Nodes: Negative for mass or adenopathy Lungs/Pleura: There is no edema, consolidation, effusion, or pneumothorax. Upper  Abdomen: No acute finding.  Cholecystectomy. Musculoskeletal: S-shaped scoliosis.  No acute or aggressive finding Review of the MIP images confirms the above findings. IMPRESSION: Negative for pulmonary embolism or other acute finding. Electronically Signed   By: JJorje GuildM.D.   On: 06/21/2022 11:28   DG Chest Port 1 View  Result Date: 06/21/2022 CLINICAL DATA:  Shortness of breath. EXAM: PORTABLE CHEST 1 VIEW COMPARISON:  08/05/2021 FINDINGS: Heart size is normal. Lung volumes are low. No pleural fluid or interstitial edema. No airspace consolidation. Visualized osseous structures are unremarkable. IMPRESSION: Low lung volumes.  No acute abnormality. Electronically Signed   By: TKerby MoorsM.D.   On: 06/21/2022 10:41    Procedures Procedures  Medications Ordered in ED Medications  lidocaine (LIDODERM) 5 % 1 patch (1 patch Transdermal Patch Applied 06/21/22 1133)  acetaminophen (TYLENOL) tablet 650 mg (650 mg Oral Given 06/21/22 1038)  iohexol (OMNIPAQUE) 350 MG/ML injection 100 mL (100 mLs Intravenous Contrast Given 06/21/22 1113)    ED Course/ Medical Decision Making/ A&P Clinical Course as of 06/21/22 1157  Sat Jun 21, 2022  1142 Troponin I (High Sensitivity) [CB]    Clinical Course User Index [CB] Gwenevere Abbot, PA-C             HEART Score: 3                Medical Decision Making The patient presented today for chest pain that had started 2 weeks ago and worsened today. EKG showed sinus rhythm, Chest Xray was independently reviewed by me and shows no acute disease. I agree with radiology interpretation.   They were given tylenol for their symptoms, and on repeat evaluation their symptoms are improved  I considered a broad differential including but not limited to ACS, PE, Dissection, pneumothorax, costochondritis, pneumonia, GERD, pericarditis, and pericardial effusion.  PE is considered low risk.  D-dimer ordered but was positive. CTA is negative for PE or other  acute finding  I feel disssection is very unlikley  Symptoms and EKG are not consisted with pericardial effusion/pericarditis  Their heart score is 2. Troponins are negative  Plan is for charge home follow-up with PCP and cardiology.   Amount and/or Complexity of Data Reviewed Labs: ordered. Decision-making details documented in ED Course. Radiology: ordered.  Risk OTC drugs. Prescription drug management.           Final Clinical Impression(s) / ED Diagnoses Final diagnoses:  Chest pain, unspecified type    Rx / DC Orders ED Discharge Orders          Ordered    Ambulatory referral to Cardiology       Comments: If you have not heard from the Cardiology office within the next 72 hours please call [phone].   06/21/22 [address] 06/21/22 1158    Carmin Muskrat, MD 06/22/22 [phone]

## 2022-06-21 NOTE — ED Triage Notes (Signed)
Pt here with L sided chest pain radiating to L arm onset 2 weeks ago. Pt also with exertional shob. Other complaints are migraine and R leg pain shooting down from her him. Pt in NAD. Hx acid reflux, took medication yesterday with relief. Pt states pain is worse today.

## 2022-07-01 ENCOUNTER — Ambulatory Visit (INDEPENDENT_AMBULATORY_CARE_PROVIDER_SITE_OTHER): Payer: BC Managed Care – PPO | Admitting: Psychiatry

## 2022-07-01 DIAGNOSIS — F411 Generalized anxiety disorder: Secondary | ICD-10-CM | POA: Diagnosis not present

## 2022-07-01 DIAGNOSIS — F321 Major depressive disorder, single episode, moderate: Secondary | ICD-10-CM

## 2022-07-01 NOTE — Progress Notes (Signed)
Virtual Visit via Video Note  I connected with MARLINA KALANI on 07/01/22 at  2:00 PM EST by a video enabled telemedicine application and verified that I am speaking with the correct person using two identifiers.  Location: Patient: Home Provider: [facility] office    I discussed the limitations of evaluation and management by telemedicine and the availability of in person appointments. The patient expressed understanding and agreed to proceed.   I provided 57 minutes of non-face-to-face time during this encounter.   [name], LCSW                               THERAPIST PROGRESS NOTE     Session Time: Tuesday  07/01/2022 2:04 PM - 3:00 PM  Participation Level: Active  Behavioral Response: talkative  Type of Therapy: Individual Therapy  Treatment Goals addressed: Alleviate symptoms of depression, process and resolve grief and loss issues   Interventions: CBT and Supportive  Summary: AZAYA MCCOSKEY is a 57 y.o. female who presents with symptoms of anxiety that have been lilfelong per patient's report but symptoms worsened when her mother died 15+ years ago. Also during the same 2 month period around her mother's death,her grandparents and her sister died.    She last was seen in Sep 17, 2021.  She is resuming services today as she is experiencing increased symptoms of depression and anxiety including depressed mood, crying spells, sleep difficulty, and persistent worry.  She reports passive suicidal ideations of wishing she was dead at times but denies any plan or intent to harm self.  Patient reports unresolved grief and loss issues regarding the death of her brother in Jul 18, 2021.  She reports increased thoughts about brother as the upcoming anniversary of his death approaches.  She verbalizes thoughts of self blame and letting her brother as well as her mother down as she was not with brother when he died.  She also is experiencing anticipatory grief regarding  her father who has prostate cancer and does not have much longer to live per patient's report.  Patient reports additional stress regarding son who has a history of substance abuse/dependence apparently overdosing 2 months ago.  Patient reports finding him on the floor and administering Narcan as well as doing CPR to bring him back.  She reports son and his girlfriend are having conflict.  She worries this may trigger a relapse for her son.  Patient reports strong family support from her other son, her husband, siblings, and friends.  They have been encouraging patient to try to get involved in activities by doing different activities with patient.  She reports feeling better once she participates in these activities.  Patient reports feeling overwhelmed.  However, she is looking forward to going on a cruise in 18-Sep-2022 as well as going to [place] after she returns from cruise.   Suicidal/Homicidal: None  Therapist Response: reviewed symptoms, administered PHQ 2 and 9, discussed stressors, facilitated expression of thoughts and feelings, validated feelings, facilitated patient sharing narrative of her brother's death, normalized feelings related to grief and loss as well as anticipatory grief, began to assist patient examine/challenge/and replace her thought patterns evoking inappropriate self blame/guilt, assisted patient began to examine realistic expectations of herself regarding situation between son and his girlfriend, began to discuss ways to set maintain limits, praised and reinforced patient's use of her support system/efforts to engage in activities with support system, discussed effects, assisted  patient identify ways to increase consistency regarding behavioral activation/socialization with the use of daily planning, developed plan with patient to use daily planning to schedule pleasant activities,   Plan: Return again in 2 weeks  Diagnosis: Axis I: Generalized Anxiety Disorder      Axis II: No  diagnosis Collaboration of Care: Psychiatrist AEB patient sees psychiatrist Dr. Harrington Challenger in this practice for medication management  Patient/Guardian was advised Release of Information must be obtained prior to any record release in order to collaborate their care with an outside provider. Patient/Guardian was advised if they have not already done so to contact the registration department to sign all necessary forms in order for [place] to release information regarding their care.   Consent: Patient/Guardian gives verbal consent for treatment and assignment of benefits for services provided during this visit. Patient/Guardian expressed understanding and agreed to proceed.    [name], LCSW 07/01/2022

## 2022-07-11 DIAGNOSIS — R079 Chest pain, unspecified: Secondary | ICD-10-CM | POA: Diagnosis not present

## 2022-07-11 DIAGNOSIS — Z6841 Body Mass Index (BMI) 40.0 and over, adult: Secondary | ICD-10-CM | POA: Diagnosis not present

## 2022-07-11 DIAGNOSIS — M797 Fibromyalgia: Secondary | ICD-10-CM | POA: Diagnosis not present

## 2022-07-21 DIAGNOSIS — R0602 Shortness of breath: Secondary | ICD-10-CM | POA: Diagnosis not present

## 2022-07-21 DIAGNOSIS — R109 Unspecified abdominal pain: Secondary | ICD-10-CM | POA: Diagnosis not present

## 2022-07-21 DIAGNOSIS — Z880 Allergy status to penicillin: Secondary | ICD-10-CM | POA: Diagnosis not present

## 2022-07-21 DIAGNOSIS — N907 Vulvar cyst: Secondary | ICD-10-CM | POA: Diagnosis not present

## 2022-07-21 DIAGNOSIS — Z888 Allergy status to other drugs, medicaments and biological substances status: Secondary | ICD-10-CM | POA: Diagnosis not present

## 2022-07-21 DIAGNOSIS — R0600 Dyspnea, unspecified: Secondary | ICD-10-CM | POA: Diagnosis not present

## 2022-07-21 DIAGNOSIS — R079 Chest pain, unspecified: Secondary | ICD-10-CM | POA: Diagnosis not present

## 2022-07-30 ENCOUNTER — Ambulatory Visit: Payer: BC Managed Care – PPO | Attending: Internal Medicine | Admitting: Internal Medicine

## 2022-07-30 ENCOUNTER — Ambulatory Visit (INDEPENDENT_AMBULATORY_CARE_PROVIDER_SITE_OTHER): Payer: BC Managed Care – PPO

## 2022-07-30 ENCOUNTER — Other Ambulatory Visit: Payer: Self-pay | Admitting: Internal Medicine

## 2022-07-30 ENCOUNTER — Telehealth: Payer: Self-pay | Admitting: Internal Medicine

## 2022-07-30 ENCOUNTER — Encounter: Payer: Self-pay | Admitting: *Deleted

## 2022-07-30 ENCOUNTER — Encounter: Payer: Self-pay | Admitting: Internal Medicine

## 2022-07-30 VITALS — BP 110/80 | HR 77 | Ht 63.0 in | Wt 263.0 lb

## 2022-07-30 DIAGNOSIS — R002 Palpitations: Secondary | ICD-10-CM

## 2022-07-30 DIAGNOSIS — R079 Chest pain, unspecified: Secondary | ICD-10-CM | POA: Diagnosis not present

## 2022-07-30 DIAGNOSIS — R0609 Other forms of dyspnea: Secondary | ICD-10-CM | POA: Diagnosis not present

## 2022-07-30 DIAGNOSIS — G4733 Obstructive sleep apnea (adult) (pediatric): Secondary | ICD-10-CM | POA: Diagnosis not present

## 2022-07-30 NOTE — Patient Instructions (Addendum)
Medication Instructions:  Your physician recommends that you continue on your current medications as directed. Please refer to the Current Medication list given to you today.  Labwork: none  Testing/Procedures: Your physician has requested that you have a lexiscan myoview. For further information please visit [email or web address]. Please follow instruction sheet, as given. Your physician has requested that you have an echocardiogram. Echocardiography is a painless test that uses sound waves to create images of your heart. It provides your doctor with information about the size and shape of your heart and how well your heart's chambers and valves are working. This procedure takes approximately one hour. There are no restrictions for this procedure. Please do NOT wear cologne, perfume, aftershave, or lotions (deodorant is allowed). Please arrive 15 minutes prior to your appointment time. Your physician has recommended that you wear a Zio monitor.   This monitor is a medical device that records the heart's electrical activity. Doctors most often use these monitors to diagnose arrhythmias. Arrhythmias are problems with the speed or rhythm of the heartbeat. The monitor is a small device applied to your chest. You can wear one while you do your normal daily activities. While wearing this monitor if you have any symptoms to push the button and record what you felt. Once you have worn this monitor for the period of time provider prescribed (for 14 days), you will return the monitor device in the postage paid box. Once it is returned they will download the data collected and provide [place] with a report which the provider will then review and we will call you with those results. Important tips:  Avoid showering during the first 24 hours of wearing the monitor. Avoid excessive sweating to help maximize wear time. Do not submerge the device, no hot tubs, and no swimming pools. Keep any lotions or oils away from  the patch. After 24 hours you may shower with the patch on. Take brief showers with your back facing the shower head.  Do not remove patch once it has been placed because that will interrupt data and decrease adhesive wear time. Push the button when you have any symptoms and write down what you were feeling. Once you have completed wearing your monitor, remove and place into box which has postage paid and place in your outgoing mailbox.  If for some reason you have misplaced your box then call our office and we can provide another box and/or mail it off for you.  Follow-Up: Your physician recommends that you schedule a follow-up appointment in: 6 months  Any Other Special Instructions Will Be Listed Below (If Applicable). [place] for monitoring of EKGs at home: You can look into the [facility] device by [employer].This device is purchased by you and it connects to an application you download to your smart phone.  It can detect abnormal heart rhythms and alert you to contact your doctor for further evaluation. The device is approximately $90 and the phone application is free.  The web site is:  https://www.alivecor.com   If you need a refill on your cardiac medications before your next appointment, please call your pharmacy.

## 2022-07-30 NOTE — Progress Notes (Signed)
Cardiology Office Note  Date: 07/30/2022   ID: Virginia Trujillo, DOB 31-Jul-1965, MRN [id number]  PCP:  Sharilyn Sites, MD  Cardiologist:  Chalmers Guest, MD Electrophysiologist:  None   Reason for Office Visit: Evaluation of chest pain at the request of Amedeo Gory, PA-C  History of Present Illness: Virginia Trujillo is a 57 y.o. female known to have OSA on CPAP, anxiety/depression was referred to cardiology clinic for evaluation of chest pain.  Patient started to have left-sided chest pain x started 2 months ago, occurring 2-3 times per week, lasting for 5 to 10 minutes, mainly occurs with exertion but sometimes at rest and resolves when she takes a deep breath or uses CPAP machine. Associated with SOB, dizziness. She said it kind of feels similar to anxiety/panic attack. She had ER visit on 06/21/2022 when she was woken up by the left-sided chest pain. Labs and EKG were within normal limits.  When she was in the ER, the cardiac monitor notified irregular heartbeat multiple times and hence she was referred here for further evaluation.  She also has palpitations few times per week. She has DOE x many years, had to take multiple breaks to perform her household chores. Has OSA and compliant with CPAP. Denies smoking cigarettes, alcohol use and illicit drug abuse.  Her father had stents when he was in his 44s and her sister was diagnosed with CHF in her 83s.  Past Medical History:  Diagnosis Date   Acid reflux    Anxiety    Asthma    Depression    Fibromyalgia    Sleep apnea     Past Surgical History:  Procedure Laterality Date   CESAREAN SECTION     CHOLECYSTECTOMY N/A 08/14/2021   Procedure: LAPAROSCOPIC CHOLECYSTECTOMY;  Surgeon: Rusty Aus, DO;  Location: [facility];  Service: General;  Laterality: N/A;   MOUTH SURGERY     TUBAL LIGATION     tubes tied      Current Outpatient Medications  Medication Sig Dispense Refill   albuterol (PROVENTIL) (2.5 MG/3ML) 0.083% nebulizer  solution Take 2.5 mg by nebulization every 6 (six) hours as needed for wheezing or shortness of breath.     albuterol (VENTOLIN HFA) 108 (90 Base) MCG/ACT inhaler Inhale 2 puffs into the lungs every 4 (four) hours as needed for wheezing or shortness of breath.     ALPRAZolam (XANAX) 1 MG tablet Take 1 tablet (1 mg total) by mouth 3 (three) times daily as needed for anxiety. 90 tablet 2   ARIPiprazole (ABILIFY) 2 MG tablet Take 1 tablet (2 mg total) by mouth every morning. 30 tablet 2   baclofen (LIORESAL) 10 MG tablet Take 10 mg by mouth 3 (three) times daily.     DULoxetine (CYMBALTA) 60 MG capsule Take 1 capsule (60 mg total) by mouth 2 (two) times daily. 60 capsule 2   montelukast (SINGULAIR) 10 MG tablet Take 10 mg by mouth daily.     pantoprazole (PROTONIX) 40 MG tablet Take 40 mg by mouth daily.     traMADol-acetaminophen (ULTRACET) 37.5-325 MG tablet Take 2 tablets by mouth every 6 (six) hours as needed for moderate pain or severe pain.     No current facility-administered medications for this visit.   Allergies:  Prednisone and Penicillins   Social History: The patient  reports that she has never smoked. She has never used smokeless tobacco. She reports that she does not drink alcohol and does not use drugs.  Family History: The patient's family history includes Anxiety disorder in her brother and sister; Arthritis in her unknown relative; Asthma in her unknown relative; Cancer in her unknown relative; Congestive Heart Failure in her sister; Depression in her brother and sister; Diabetes in her brother, sister, and unknown relative; Heart disease in her unknown relative; Hypertension in her brother, father, and sister; Kidney disease in her unknown relative; Stroke in her maternal grandmother and paternal grandfather.   ROS:  Please see the history of present illness. Otherwise, complete review of systems is positive for none.  All other systems are reviewed and negative.   Physical  Exam: VS:  BP 110/80 (BP Location: Left Arm, Patient Position: Sitting, Cuff Size: Large)   Pulse 77   Ht 5\' 3"  (1.6 m)   Wt 263 lb (119.3 kg)   BMI 46.59 kg/m , BMI Body mass index is 46.59 kg/m.  Wt Readings from Last 3 Encounters:  07/30/22 263 lb (119.3 kg)  06/21/22 262 lb (118.8 kg)  08/12/21 259 lb (117.5 kg)    General: Patient appears comfortable at rest. HEENT: Conjunctiva and lids normal, oropharynx clear with moist mucosa. Neck: Supple, no elevated JVP or carotid bruits, no thyromegaly. Lungs: Clear to auscultation, nonlabored breathing at rest. Cardiac: Regular rate and rhythm, no S3 or significant systolic murmur, no pericardial rub. Abdomen: Soft, nontender, no hepatomegaly, bowel sounds present, no guarding or rebound. Extremities: No pitting edema, distal pulses 2+. Skin: Warm and dry. Musculoskeletal: No kyphosis. Neuropsychiatric: Alert and oriented x3, affect grossly appropriate.  ECG:  An ECG dated 07/30/2022 was personally reviewed today and demonstrated:  Normal sinus rhythm with no ST changes  Recent Labwork: 06/21/2022: BUN 11; Creatinine, Ser 0.99; Hemoglobin 12.7; Platelets 164; Potassium 4.0; Sodium 137  No results found for: "CHOL", "TRIG", "HDL", "CHOLHDL", "VLDL", "LDLCALC", "LDLDIRECT"  Other Studies Reviewed Today:   Assessment and Plan: Patient is a 58 year old F known to have OSA on CPAP, anxiety/depression was referred to cardiology clinic for evaluation of chest pain and palpitations.  # Chest pain # DOE, rule out HFpEF -Patient started to have left-sided chest pain x started 2 months ago, occurring 2-3 times per week, lasting for 5 to 10 minutes, mainly occurs with exertion but sometimes at rest and resolves when she takes a deep breath or uses CPAP machine. Associated with SOB, dizziness. Obtain Lexiscan and 2D echocardiogram.  # Palpitations -Patient has OSA compliant with CPAP therapy and has been having palpitations few times per week.  Will obtain 2-week event monitor to rule out any atrial or ventricular arrhythmias.  # OSA on CPAP -Encouraged to continue to be compliant with CPAP. Follow-up with PCP for management of OSA.  I have spent a total of 45 minutes with patient reviewing chart, EKGs, labs and examining patient as well as establishing an assessment and plan that was discussed with the patient.  > 50% of time was spent in direct patient care.      Medication Adjustments/Labs and Tests Ordered: Current medicines are reviewed at length with the patient today.  Concerns regarding medicines are outlined above.   Tests Ordered: Orders Placed This Encounter  Procedures   NM Myocar Multi W/Spect W/Wall Motion / EF   EKG 12-Lead   ECHOCARDIOGRAM COMPLETE    Medication Changes: No orders of the defined types were placed in this encounter.   Disposition:  Follow up  6 months  Signed Jisele Price Fidel Levy, MD, 07/30/2022 10:52 AM    [facility] [place], [place], [place] [address]

## 2022-07-30 NOTE — Telephone Encounter (Signed)
Checking percert on the following patient for testing scheduled at [facility].     Lexiscan dx: chest pain      14 Day ZIO XT dx: palpitations

## 2022-08-05 ENCOUNTER — Other Ambulatory Visit: Payer: Self-pay | Admitting: Internal Medicine

## 2022-08-05 DIAGNOSIS — G4733 Obstructive sleep apnea (adult) (pediatric): Secondary | ICD-10-CM

## 2022-08-05 DIAGNOSIS — R002 Palpitations: Secondary | ICD-10-CM

## 2022-08-05 DIAGNOSIS — R0609 Other forms of dyspnea: Secondary | ICD-10-CM

## 2022-08-05 DIAGNOSIS — R079 Chest pain, unspecified: Secondary | ICD-10-CM

## 2022-08-08 ENCOUNTER — Encounter (HOSPITAL_COMMUNITY): Payer: Self-pay

## 2022-08-08 ENCOUNTER — Ambulatory Visit (INDEPENDENT_AMBULATORY_CARE_PROVIDER_SITE_OTHER): Payer: BC Managed Care – PPO | Admitting: Psychiatry

## 2022-08-08 ENCOUNTER — Telehealth (HOSPITAL_COMMUNITY): Payer: Self-pay | Admitting: Psychiatry

## 2022-08-08 DIAGNOSIS — F411 Generalized anxiety disorder: Secondary | ICD-10-CM | POA: Diagnosis not present

## 2022-08-08 DIAGNOSIS — F321 Major depressive disorder, single episode, moderate: Secondary | ICD-10-CM

## 2022-08-08 NOTE — Telephone Encounter (Signed)
Therapist attempted to contact patient for scheduled appointment and received voicemail message indicating voicemail box is full.  Therefore, therapist was unable to leave message.

## 2022-08-08 NOTE — Progress Notes (Signed)
Virtual Visit via Telephone Note  I connected with Virginia Trujillo on 08/08/22 at 10:20 AM EDT  by telephone and verified that I am speaking with the correct person using two identifiers.  Location: Patient: Home Provider: [facility] [place] office    I discussed the limitations, risks, security and privacy concerns of performing an evaluation and management service by telephone and the availability of in person appointments. I also discussed with the patient that there may be a patient responsible charge related to this service. The patient expressed understanding and agreed to proceed.     I provided 38 minutes of non-face-to-face time during this encounter.   Adah Salvage, LCSW                               THERAPIST PROGRESS NOTE     Session Time: Friday 08/08/2022 10:20 AM -  10:58 AM   Participation Level: Active  Behavioral Response: talkative  Type of Therapy: Individual Therapy  Treatment Goals addressed: Cleaster will score less than 5 on the Generalized Anxiety Disorder 7 Scale (GAD-7)  Pt will practice relaxation techniques daily    Progress on Goals:  Initial   Interventions: CBT and Supportive  Summary: Virginia Trujillo is a 57 y.o. female who presents with symptoms of anxiety that have been lilfelong per patient's report but symptoms worsened when her mother died 15+ years ago. Also during the same 2 month period around her mother's death,her grandparents and her sister died.   Patient resumes services in 06-Aug-2022 as she is experiencing increased symptoms of depression and anxiety including depressed mood, crying spells, sleep difficulty, and persistent worry.  She reports passive suicidal ideations of wishing she was dead at times but denies any plan or intent to harm self.  Patient reports unresolved grief and loss issues regarding the death of her brother in 08/05/21.  She reports increased thoughts about brother as the upcoming anniversary of his death  approaches.  She verbalizes thoughts of self blame and letting her brother as well as her mother down as she was not with brother when he died.  She also is experiencing anticipatory grief regarding her father who has prostate cancer and does not have much longer to live per patient's report.  Patient reports additional stress regarding son who has a history of substance abuse/dependence apparently overdosing 2 months ago.  Patient reports finding him on the floor and administering Narcan as well as doing CPR to bring him back.  She reports son and his girlfriend are having conflict.  She worries this may trigger a relapse for her son.  Patient reports strong family support from her other son, her husband, siblings, and friends.  They have been encouraging patient to try to get involved in activities by doing different activities with patient.  She reports feeling better once she participates in these activities.  Patient reports feeling overwhelmed.  However, she is looking forward to going on a cruise in May as well as going to [place] after she returns from cruise.  Patient last was seen about a month ago and reports continued symptoms of depression and anxiety as reflected in the PHQ 2 and 9 and the GAD-7.  She continues to experience multiple stressors including concerns regarding her 64 year old father who has prostate cancer and legal concerns regarding one of her sons.  She expresses sadness and frustration as she is unable to keep her  3 grandchildren overnight at her home due to a 50 be gets her son who is residing with patient.  She can visit the grandchildren at their mother's home.  She also is worried about her other son who recently resigned his duties at [employer] patient and her family attends.  Patient reports additional stress regarding her own health as she continues to wear a heart monitor.  She also reports increased grief and loss issues as the first anniversary of her brother's  death was March 30.  Patient reports tearfulness, excessive worry, sleep difficulty, and nervousness as well as depressed mood and sadness.   Patient   Suicidal/Homicidal: None     Therapist Response: reviewed symptoms, administered PHQ 2 and 9 and GAD-7, discussed stressors, facilitated expression of thoughts and feelings, validated feelings, normalized feelings related to grief and loss, assisted patient develop treatment plan, obtained patient's permission to electronically signed plan for patient as this was a virtual visit, began to provide psychoeducation on anxiety and the stress response, discussed rationale for practicing deep breathing to trigger relaxation response, developed plan with patient to practice deep breathing 5 to 10 minutes 2 times per day  Plan: Return again in 2 weeks  Diagnosis: Axis I: Generalized Anxiety Disorder      Axis II: No diagnosis Collaboration of Care: Psychiatrist AEB patient sees psychiatrist Dr. Tenny Crawoss in this practice for medication management  Patient/Guardian was advised Release of Information must be obtained prior to any record release in order to collaborate their care with an outside provider. Patient/Guardian was advised if they have not already done so to contact the registration department to sign all necessary forms in order for us to release information regarding their care.   Consent: Patient/Guardian gives verbal consent for treatment and assignment of benefits for services provided during this visit. Patient/Guardian expressed understanding and agreed to proceed.    Adah Salvageeggy E Mirakle Tomlin, LCSW 08/08/2022  Active     Anxiety- excessive worry, sleep difficulty,      LTG: Link Snufferddie will score less than 5 on the Generalized Anxiety Disorder 7 Scale (GAD-7)      Start:  08/08/22    Expected End:  02/09/23         STG: Pt will practice relaxation techniques daily      Start:  08/08/22    Expected End:  02/09/23

## 2022-08-15 ENCOUNTER — Ambulatory Visit (HOSPITAL_COMMUNITY)
Admission: RE | Admit: 2022-08-15 | Discharge: 2022-08-15 | Disposition: A | Discharge status: Home / Self Care | Payer: BC Managed Care – PPO | Source: Ambulatory Visit | Attending: Internal Medicine | Admitting: Internal Medicine

## 2022-08-15 ENCOUNTER — Encounter (HOSPITAL_COMMUNITY)
Admission: RE | Admit: 2022-08-15 | Discharge: 2022-08-15 | Disposition: A | Discharge status: Home / Self Care | Payer: BC Managed Care – PPO | Source: Ambulatory Visit | Attending: Internal Medicine | Admitting: Internal Medicine

## 2022-08-15 DIAGNOSIS — R079 Chest pain, unspecified: Secondary | ICD-10-CM | POA: Insufficient documentation

## 2022-08-15 DIAGNOSIS — R0609 Other forms of dyspnea: Secondary | ICD-10-CM | POA: Insufficient documentation

## 2022-08-15 LAB — NM MYOCAR MULTI W/SPECT W/WALL MOTION / EF
LV dias vol: 70 mL (ref 46–106)
LV sys vol: 24 mL
Nuc Stress EF: 66 %
Peak HR: 101 {beats}/min
RATE: 0.3
Rest HR: 61 {beats}/min
Rest Nuclear Isotope Dose: 10.9 mCi
SDS: 0
SRS: 0
SSS: 0
ST Depression (mm): 0 mm
Stress Nuclear Isotope Dose: 32.1 mCi
TID: 0.92

## 2022-08-15 MED ORDER — REGADENOSON 0.4 MG/5ML IV SOLN
INTRAVENOUS | Status: AC
  Filled 2022-08-15: qty 5

## 2022-08-15 MED ORDER — SODIUM CHLORIDE FLUSH 0.9 % IV SOLN
INTRAVENOUS | Status: AC
  Filled 2022-08-15: qty 10

## 2022-08-15 MED ORDER — TECHNETIUM TC 99M TETROFOSMIN IV KIT
10.9000 | PACK | Freq: Once | INTRAVENOUS | Status: AC | PRN
  Administered 2022-08-15: 10.9 via INTRAVENOUS

## 2022-08-15 MED ORDER — TECHNETIUM TC 99M TETROFOSMIN IV KIT
32.1000 | PACK | Freq: Once | INTRAVENOUS | Status: AC | PRN
  Administered 2022-08-15: 32.1 via INTRAVENOUS

## 2022-08-22 ENCOUNTER — Ambulatory Visit (INDEPENDENT_AMBULATORY_CARE_PROVIDER_SITE_OTHER): Payer: BC Managed Care – PPO | Admitting: Psychiatry

## 2022-08-22 DIAGNOSIS — F411 Generalized anxiety disorder: Secondary | ICD-10-CM

## 2022-08-22 DIAGNOSIS — F321 Major depressive disorder, single episode, moderate: Secondary | ICD-10-CM

## 2022-08-22 NOTE — Progress Notes (Signed)
Virtual Visit via Telephone Note  I connected with Virginia Trujillo on 08/22/22 at 10:12 PM EDT  by telephone and verified that I am speaking with the correct person using two identifiers.  Location: Patient: Home Provider: [facility] [place] office    I discussed the limitations, risks, security and privacy concerns of performing an evaluation and management service by telephone and the availability of in person appointments. I also discussed with the patient that there may be a patient responsible charge related to this service. The patient expressed understanding and agreed to proceed.    I provided 40 minutes of non-face-to-face time during this encounter.   Adah Salvage, LCSW                                THERAPIST PROGRESS NOTE     Session Time: Friday 08/22/2022 10:12 AM - 10:52 AM   Participation Level: Active  Behavioral Response: talkative  Type of Therapy: Individual Therapy  Treatment Goals addressed: Myley will score less than 5 on the Generalized Anxiety Disorder 7 Scale (GAD-7)  Pt will practice relaxation techniques daily    Progress on Goals:  progressing   Interventions: CBT and Supportive  Summary: Virginia Trujillo is a 57 y.o. female who presents with symptoms of anxiety that have been lilfelong per patient's report but symptoms worsened when her mother died 15+ years ago. Also during the same 2 month period around her mother's death,her grandparents and her sister died.   Patient resumes services in 25-Jul-2022 as she is experiencing increased symptoms of depression and anxiety including depressed mood, crying spells, sleep difficulty, and persistent worry.  She reports passive suicidal ideations of wishing she was dead at times but denies any plan or intent to harm self.  Patient reports unresolved grief and loss issues regarding the death of her brother in 07-24-21.  She reports increased thoughts about brother as the upcoming anniversary of his  death approaches.  She verbalizes thoughts of self blame and letting her brother as well as her mother down as she was not with brother when he died.  She also is experiencing anticipatory grief regarding her father who has prostate cancer and does not have much longer to live per patient's report.  Patient reports additional stress regarding son who has a history of substance abuse/dependence apparently overdosing 2 months ago.  Patient reports finding him on the floor and administering Narcan as well as doing CPR to bring him back.  She reports son and his girlfriend are having conflict.  She worries this may trigger a relapse for her son.  Patient reports strong family support from her other son, her husband, siblings, and friends.  They have been encouraging patient to try to get involved in activities by doing different activities with patient.  She reports feeling better once she participates in these activities.  Patient reports feeling overwhelmed.  However, she is looking forward to going on a cruise in May as well as going to [place] after she returns from cruise.  Patient last was seen about 2 weeks ago.  She continues to experience symptoms of anxiety as reflected in the GAD-7.  She continues to report multiple stressors including father's declining health, concerns about a niece and her family dealing with issues related to the murder of her niece's son 2 years ago, and concerns about her sister who recently had back surgery.  She has  been coping with stress and anxiety by practicing deep breathing 3-4 times a day and says this has been helpful.  She also reports becoming more aware of negative thoughts and reports she has been trying to use distracting activities to cope.  She reports being talking to being supportive to a close friend whose brother recently died also has been helpful.  Patient's room report, she and friend comforted shelter as they have above lost their brothers.  She reports  decreased stress about her son as the 82 [address] was dismissed.  As a result, patient now can resume having her 3 grandchildren stay overnight at her home.  She is excited about this as they are not spending the night tonight.  She also is excited she and her husband are going on a cruise in 2 weeks.  Patient   Suicidal/Homicidal: None     Therapist Response: reviewed symptoms,  GAD-7, discussed results ,discussed stressors, facilitated expression of thoughts and feelings, validated feelings, praised and reinforced patient's use of helpful coping strategies (deep breathing, distracting activities), discussed effects of use, developed plan with patient to continue and maintain consistent efforts, assisted patient examine her thought patterns regarding concerns especially those regarding her sister, assisted patient challenge and replace catastrophizing thoughts, also discussed patient planning activities, encouraged patient to follow through with plans regarding developing timeline for household projects like painting and working in her yard   Plan: Return again in 2 weeks  Diagnosis: Axis I: Generalized Anxiety Disorder      Axis II: No diagnosis Collaboration of Care: Psychiatrist AEB patient sees psychiatrist Dr. Tenny Craw in this practice for medication management  Patient/Guardian was advised Release of Information must be obtained prior to any record release in order to collaborate their care with an outside provider. Patient/Guardian was advised if they have not already done so to contact the registration department to sign all necessary forms in order for [place] to release information regarding their care.   Consent: Patient/Guardian gives verbal consent for treatment and assignment of benefits for services provided during this visit. Patient/Guardian expressed understanding and agreed to proceed.    Adah Salvage, LCSW 08/22/2022

## 2022-08-26 ENCOUNTER — Other Ambulatory Visit: Payer: BC Managed Care – PPO

## 2022-09-03 ENCOUNTER — Other Ambulatory Visit (HOSPITAL_COMMUNITY): Payer: Self-pay | Admitting: Psychiatry

## 2022-09-17 ENCOUNTER — Telehealth (HOSPITAL_COMMUNITY): Payer: BC Managed Care – PPO | Admitting: Psychiatry

## 2022-09-18 ENCOUNTER — Other Ambulatory Visit: Payer: BC Managed Care – PPO

## 2022-09-18 ENCOUNTER — Encounter: Payer: Self-pay | Admitting: Internal Medicine

## 2022-10-10 ENCOUNTER — Ambulatory Visit (HOSPITAL_COMMUNITY): Payer: BC Managed Care – PPO | Admitting: Psychiatry

## 2022-10-10 DIAGNOSIS — R6889 Other general symptoms and signs: Secondary | ICD-10-CM | POA: Diagnosis not present

## 2022-10-10 DIAGNOSIS — Z6841 Body Mass Index (BMI) 40.0 and over, adult: Secondary | ICD-10-CM | POA: Diagnosis not present

## 2022-10-10 DIAGNOSIS — H65193 Other acute nonsuppurative otitis media, bilateral: Secondary | ICD-10-CM | POA: Diagnosis not present

## 2022-10-10 DIAGNOSIS — Z20828 Contact with and (suspected) exposure to other viral communicable diseases: Secondary | ICD-10-CM | POA: Diagnosis not present

## 2022-10-10 DIAGNOSIS — J0100 Acute maxillary sinusitis, unspecified: Secondary | ICD-10-CM | POA: Diagnosis not present

## 2022-10-16 ENCOUNTER — Encounter (HOSPITAL_COMMUNITY): Payer: Self-pay | Admitting: Psychiatry

## 2022-10-16 ENCOUNTER — Telehealth (HOSPITAL_COMMUNITY): Payer: BC Managed Care – PPO | Admitting: Psychiatry

## 2022-10-16 DIAGNOSIS — F321 Major depressive disorder, single episode, moderate: Secondary | ICD-10-CM

## 2022-10-16 MED ORDER — ALPRAZOLAM 1 MG PO TABS: 1.0000 mg | ORAL_TABLET | Freq: Three times a day (TID) | ORAL | 2 refills | Status: DC | PRN

## 2022-10-16 MED ORDER — DULOXETINE HCL 60 MG PO CPEP: 60.0000 mg | ORAL_CAPSULE | Freq: Two times a day (BID) | ORAL | 2 refills | Status: DC

## 2022-10-16 MED ORDER — ARIPIPRAZOLE 2 MG PO TABS: 2.0000 mg | ORAL_TABLET | ORAL | 2 refills | Status: DC

## 2022-10-16 NOTE — Progress Notes (Signed)
Virtual Visit via Telephone Note  I connected with Virginia Trujillo on 10/16/22 at  1:20 PM EDT by telephone and verified that I am speaking with the correct person using two identifiers.  Location: Patient: home Provider: office   I discussed the limitations, risks, security and privacy concerns of performing an evaluation and management service by telephone and the availability of in person appointments. I also discussed with the patient that there may be a patient responsible charge related to this service. The patient expressed understanding and agreed to proceed.      I discussed the assessment and treatment plan with the patient. The patient was provided an opportunity to ask questions and all were answered. The patient agreed with the plan and demonstrated an understanding of the instructions.   The patient was advised to call back or seek an in-person evaluation if the symptoms worsen or if the condition fails to improve as anticipated.  I provided 15 minutes of non-face-to-face time during this encounter.   Diannia Ruder, MD  [facility] MD/PA/NP OP Progress Note  10/16/2022 1:38 PM Virginia Trujillo  MRN:  [id number]  Chief Complaint:  Chief Complaint  Patient presents with   Depression   Anxiety   Follow-up   HPI: This patient is a 57 year old married black female who lives with her husband in [place].  She is currently unemployed.  The patient returns after 3 months regarding her symptoms of depression and anxiety.  Lately she has had a bad cold with a possible ear infection.  She states that her PCP told her that her ears "damage" and she is waiting to get in with an ENT.  She is somewhat worried about this.  Nevertheless her mood has generally been okay and she denies severe depression or anxiety thoughts of self-harm or suicide.  She is sleeping okay but not great due to the ear pain Visit Diagnosis:    ICD-10-CM   1. Current moderate episode of major depressive disorder without  prior episode (HCC)  F32.1       Past Psychiatric History: none  Past Medical History:  Past Medical History:  Diagnosis Date   Acid reflux    Anxiety    Asthma    Depression    Fibromyalgia    Sleep apnea     Past Surgical History:  Procedure Laterality Date   CESAREAN SECTION     CHOLECYSTECTOMY N/A 08/14/2021   Procedure: LAPAROSCOPIC CHOLECYSTECTOMY;  Surgeon: Lewie Chamber, DO;  Location: [facility];  Service: General;  Laterality: N/A;   MOUTH SURGERY     TUBAL LIGATION     tubes tied      Family Psychiatric History: See below  Family History:  Family History  Problem Relation Age of Onset   Hypertension Father    Hypertension Brother    Diabetes Brother    Depression Brother    Anxiety disorder Brother    Stroke Maternal Grandmother    Stroke Paternal Grandfather    Congestive Heart Failure Sister    Diabetes Sister    Hypertension Sister    Anxiety disorder Sister    Depression Sister    Heart disease Unknown    Arthritis Unknown    Cancer Unknown    Asthma Unknown    Diabetes Unknown    Kidney disease Unknown     Social History:  Social History   Socioeconomic History   Marital status: Married    Spouse name: Not on file  Number of children: 2   Years of education: 12   Highest education level: Not on file  Occupational History   Occupation: none    Employer: UNEMPLOYED   Occupation: Unemployed   Tobacco Use   Smoking status: Never   Smokeless tobacco: Never  Vaping Use   Vaping Use: Never used  Substance and Sexual Activity   Alcohol use: No   Drug use: No   Sexual activity: Yes    Birth control/protection: Surgical  Other Topics Concern   Not on file  Social History Narrative   Patient lives at home with husband and son.    Patient does not work   Patient has a high school education    Patient has 2 children.   Social Determinants of Health   Financial Resource Strain: Not on file  Food Insecurity: Not on file   Transportation Needs: Not on file  Physical Activity: Not on file  Stress: Not on file  Social Connections: Not on file    Allergies:  Allergies  Allergen Reactions   Prednisone Other (See Comments)    Depression and crying   Penicillins     Metabolic Disorder Labs: No results found for: "HGBA1C", "MPG" No results found for: "PROLACTIN" No results found for: "CHOL", "TRIG", "HDL", "CHOLHDL", "VLDL", "LDLCALC" Lab Results  Component Value Date   TSH 3.190 11/29/2013    Therapeutic Level Labs: No results found for: "LITHIUM" No results found for: "VALPROATE" No results found for: "CBMZ"  Current Medications: Current Outpatient Medications  Medication Sig Dispense Refill   albuterol (PROVENTIL) (2.5 MG/3ML) 0.083% nebulizer solution Take 2.5 mg by nebulization every 6 (six) hours as needed for wheezing or shortness of breath.     albuterol (VENTOLIN HFA) 108 (90 Base) MCG/ACT inhaler Inhale 2 puffs into the lungs every 4 (four) hours as needed for wheezing or shortness of breath.     ALPRAZolam (XANAX) 1 MG tablet Take 1 tablet (1 mg total) by mouth 3 (three) times daily as needed for anxiety. 90 tablet 2   ARIPiprazole (ABILIFY) 2 MG tablet Take 1 tablet (2 mg total) by mouth every morning. 30 tablet 2   baclofen (LIORESAL) 10 MG tablet Take 10 mg by mouth 3 (three) times daily.     DULoxetine (CYMBALTA) 60 MG capsule Take 1 capsule (60 mg total) by mouth 2 (two) times daily. 60 capsule 2   montelukast (SINGULAIR) 10 MG tablet Take 10 mg by mouth daily.     pantoprazole (PROTONIX) 40 MG tablet Take 40 mg by mouth daily.     traMADol-acetaminophen (ULTRACET) 37.5-325 MG tablet Take 2 tablets by mouth every 6 (six) hours as needed for moderate pain or severe pain.     No current facility-administered medications for this visit.     Musculoskeletal: Strength & Muscle Tone: na Gait & Station: na Patient leans: N/A  Psychiatric Specialty Exam: Review of Systems  HENT:   Positive for congestion, hearing loss and sinus pain.   All other systems reviewed and are negative.   There were no vitals taken for this visit.There is no height or weight on file to calculate BMI.  General Appearance: NA  Eye Contact:  NA  Speech:  Clear and Coherent  Volume:  Normal  Mood:  Euthymic  Affect:  NA  Thought Process:  Goal Directed  Orientation:  Full (Time, Place, and Person)  Thought Content: Rumination   Suicidal Thoughts:  No  Homicidal Thoughts:  No  Memory:  Immediate;   Good Recent;   Good Remote;   Fair  Judgement:  Fair  Insight:  Fair  Psychomotor Activity:  Decreased  Concentration:  Concentration: Good and Attention Span: Good  Recall:  Good  Fund of Knowledge: Good  Language: Good  Akathisia:  No  Handed:  Right  AIMS (if indicated): not done  Assets:  Communication Skills Desire for Improvement Resilience Social Support  ADL's:  Intact  Cognition: WNL  Sleep:  Fair   Screenings: GAD-7    Advertising copywriter from 08/08/2022 in [facility] [place] Counselor from 11/10/2017 in [facility] [place]  Total GAD-7 Score 21 19      PHQ2-9    Flowsheet Row Counselor from 08/08/2022 in [facility] [place] Counselor from 07/01/2022 in [facility] at [place] Video Visit from 12/16/2021 in [facility] at [place] Video Visit from 10/02/2021 in [facility] at [place] Counselor from 09/03/2021 in [facility] at [facility] Total Score 4 5 1 1 3   PHQ-9 Total Score 14 23 -- -- 13      Flowsheet Row ED from 06/21/2022 in [facility] at [facility] Video Visit from 12/16/2021 in [facility] at [place] Video Visit from 10/02/2021 in [facility]  at [place]  C-SSRS RISK CATEGORY No Risk No Risk No Risk        Assessment and Plan: This patient is a 57 year old female with a history of depression and anxiety.  For the most part she is doing well on her current regimen.  She will continue Cymbalta 60 mg twice daily for depression, Abilify 2 mg daily for augmentation, Xanax 1 mg up to 3 times daily for anxiety and Ambien 10 mg at bedtime for sleep.  She will return to see me in 3 months  Collaboration of Care: Collaboration of Care: Primary Care Provider AEB notes to be shared with PCP at patient's request  Patient/Guardian was advised Release of Information must be obtained prior to any record release in order to collaborate their care with an outside provider. Patient/Guardian was advised if they have not already done so to contact the registration department to sign all necessary forms in order for [place] to release information regarding their care.   Consent: Patient/Guardian gives verbal consent for treatment and assignment of benefits for services provided during this visit. Patient/Guardian expressed understanding and agreed to proceed.    Diannia Ruder, MD 10/16/2022, 1:38 PM

## 2022-10-24 ENCOUNTER — Ambulatory Visit (HOSPITAL_COMMUNITY): Payer: BC Managed Care – PPO | Admitting: Psychiatry

## 2022-10-24 DIAGNOSIS — F411 Generalized anxiety disorder: Secondary | ICD-10-CM

## 2022-10-24 DIAGNOSIS — F321 Major depressive disorder, single episode, moderate: Secondary | ICD-10-CM

## 2022-10-24 NOTE — Progress Notes (Signed)
Virtual Visit via Telephone Note  I connected with Virginia Trujillo on 10/24/22 at 10:10 AM EDT by telephone and verified that I am speaking with the correct person using two identifiers.  Location: Patient: Home Provider: [facility] [place] office    I discussed the limitations, risks, security and privacy concerns of performing an evaluation and management service by telephone and the availability of in person appointments. I also discussed with the patient that there may be a patient responsible charge related to this service. The patient expressed understanding and agreed to proceed.    I provided 33 minutes of non-face-to-face time during this encounter.   Adah Salvage, LCSW                                THERAPIST PROGRESS NOTE     Session Time: Friday 10/24/2022 10:10  AM - 10:43 AM   Participation Level: Active  Behavioral Response: talkative  Type of Therapy: Individual Therapy  Treatment Goals addressed: Virginia Trujillo will score less than 5 on the Generalized Anxiety Disorder 7 Scale (GAD-7)  Pt will practice relaxation techniques daily    Progress on Goals:  progressing   Interventions: CBT and Supportive  Summary: Virginia Trujillo is a 57 y.o. female who presents with symptoms of anxiety that have been lilfelong per patient's report but symptoms worsened when her mother died 15+ years ago. Also during the same 2 month period around her mother's death,her grandparents and her sister died.   Patient resumes services in 08/11/2022 as she is experiencing increased symptoms of depression and anxiety including depressed mood, crying spells, sleep difficulty, and persistent worry.  She reports passive suicidal ideations of wishing she was dead at times but denies any plan or intent to harm self.  Patient reports unresolved grief and loss issues regarding the death of her brother in 2021/08/10.  She reports increased thoughts about brother as the upcoming anniversary of his  death approaches.  She verbalizes thoughts of self blame and letting her brother as well as her mother down as she was not with brother when he died.  She also is experiencing anticipatory grief regarding her father who has prostate cancer and does not have much longer to live per patient's report.  Patient reports additional stress regarding son who has a history of substance abuse/dependence apparently overdosing 2 months ago.  Patient reports finding him on the floor and administering Narcan as well as doing CPR to bring him back.  She reports son and his girlfriend are having conflict.  She worries this may trigger a relapse for her son.  Patient reports strong family support from her other son, her husband, siblings, and friends.  They have been encouraging patient to try to get involved in activities by doing different activities with patient.  She reports feeling better once she participates in these activities.  Patient reports feeling overwhelmed.  However, she is looking forward to going on a cruise in May as well as going to [place] after she returns from cruise.  Patient last was seen about 8-9 weeks ago.  She reports increased symptoms of anxiety as reflected in the GAD-7.  She expresses less worry abut her family as they all are doing well. She also reports having more realistic expectations of self regarding her youngest son. She states realizing he is an adult and learning he has to take care of his own problems. She  reports increased stress and worry about her own health as she has been suffering from sinus issues.  This has resulted in severe ear infections and patient currently is taking antibiotics.  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  She has been referred to an ENT specialist but is not able to see the specialist until July 26.  Patient reports sleep difficulty due to worry and pain.  She has been trying to use distracting  activities and deep breathing to cope with worry.  Patient reports enjoying the cruise she went on last month and is looking forward to going on another cruise with her family in July.  She also is looking forward to going on a family beach trip in October.  She has been trying to maintain involvement in activities him reports recently attending a family event.  Patient reports this was helpful.  She also continues to enjoy keeping her grandchildren.   She and therapist agreed to end session early today as patient is not feeling well and is experiencing hearing difficulty.  Suicidal/Homicidal: None     Therapist Response: reviewed symptoms, administered GAD-7, discussed results ,discussed stressors, facilitated expression of thoughts and feelings, validated feelings, praised and reinforced patient's use of helpful coping strategies (deep breathing, distracting activities), developed plan with patient to maintain consistent efforts, also assisted patient identify coping statements to manage stress and anxiety   Plan: Return again in 2 weeks  Diagnosis: Axis I: Generalized Anxiety Disorder      Axis II: No diagnosis Collaboration of Care: Psychiatrist AEB patient sees psychiatrist Dr. Tenny Craw in this practice for medication management  Patient/Guardian was advised Release of Information must be obtained prior to any record release in order to collaborate their care with an outside provider. Patient/Guardian was advised if they have not already done so to contact the registration department to sign all necessary forms in order for [place] to release information regarding their care.   Consent: Patient/Guardian gives verbal consent for treatment and assignment of benefits for services provided during this visit. Patient/Guardian expressed understanding and agreed to proceed.    Adah Salvage, LCSW 10/24/2022

## 2022-11-10 ENCOUNTER — Ambulatory Visit (INDEPENDENT_AMBULATORY_CARE_PROVIDER_SITE_OTHER): Payer: BC Managed Care – PPO | Admitting: Psychiatry

## 2022-11-10 DIAGNOSIS — F321 Major depressive disorder, single episode, moderate: Secondary | ICD-10-CM

## 2022-11-10 DIAGNOSIS — F411 Generalized anxiety disorder: Secondary | ICD-10-CM | POA: Diagnosis not present

## 2022-11-10 NOTE — Progress Notes (Signed)
Virtual Visit via Telephone Note  I connected with Virginia Trujillo on 11/10/22 at 10;15 AM EDT  by telephone and verified that I am speaking with the correct person using two identifiers.  Location: Patient: Home Provider: [facility] [place] office    I discussed the limitations, risks, security and privacy concerns of performing an evaluation and management service by telephone and the availability of in person appointments. I also discussed with the patient that there may be a patient responsible charge related to this service. The patient expressed understanding and agreed to proceed.   I provided 21 minutes of non-face-to-face time during this encounter.   Adah Salvage, LCSW                                 THERAPIST PROGRESS NOTE     Session Time: Monday 11/10/2022 10:15 AM -  10:36 AM   Participation Level: Active  Behavioral Response: talkative  Type of Therapy: Individual Therapy  Treatment Goals addressed: Casyn will score less than 5 on the Generalized Anxiety Disorder 7 Scale (GAD-7)  Pt will practice relaxation techniques daily    Progress on Goals:  progressing   Interventions: CBT and Supportive  Summary: Virginia Trujillo is a 57 y.o. female who presents with symptoms of anxiety that have been lilfelong per patient's report but symptoms worsened when her mother died 15+ years ago. Also during the same 2 month period around her mother's death,her grandparents and her sister died.   Patient resumes services in 2022/07/19 as she is experiencing increased symptoms of depression and anxiety including depressed mood, crying spells, sleep difficulty, and persistent worry.  She reports passive suicidal ideations of wishing she was dead at times but denies any plan or intent to harm self.  Patient reports unresolved grief and loss issues regarding the death of her brother in 2021/07/18.  She reports increased thoughts about brother as the upcoming anniversary of his  death approaches.  She verbalizes thoughts of self blame and letting her brother as well as her mother down as she was not with brother when he died.  She also is experiencing anticipatory grief regarding her father who has prostate cancer and does not have much longer to live per patient's report.  Patient reports additional stress regarding son who has a history of substance abuse/dependence apparently overdosing 2 months ago.  Patient reports finding him on the floor and administering Narcan as well as doing CPR to bring him back.  She reports son and his girlfriend are having conflict.  She worries this may trigger a relapse for her son.  Patient reports strong family support from her other son, her husband, siblings, and friends.  They have been encouraging patient to try to get involved in activities by doing different activities with patient.  She reports feeling better once she participates in these activities.  Patient reports feeling overwhelmed.  However, she is looking forward to going on a cruise in May as well as going to [place] after she returns from cruise.  Patient last was seen about 2-3 weeks ago.  She reports continued symptoms of anxiety as reflected in the GAD-7.  She continues to express less worry abut her family as they all are doing well.  However, she reports increased stress and worry about her health.  She still continues to experience hearing difficulty and expresses frustration she is unable to see the ENT  specialist until July 26.  She reports hearing has improved somewhat in 1 year but still experiencing severe hearing difficulty in the other ear.  She states having frustration related to constantly having to ask others to repeat their self during conversation. She reports worry thoughts losing her hearing.  She also reports being somewhat down as she has experienced more aches and pains in her body particularly in her knees.  She is preparing to attend appointment with her  doctor today regarding this.  Patient reports she has been trying to use distracting activities and support from her family to cope.     Suicidal/Homicidal: None     Therapist Response: reviewed symptoms, administered GAD-7, discussed results ,discussed stressors, facilitated expression of thoughts and feelings, validated feelings, praised and reinforced patient's use of patent activities and support from her family to try to cope, assisted patient examine her worry thoughts about her hearing, assisted patient identify how she would handle it if her worst fears came true, discussed rationale for and assisted patient practice a mindfulness activity using breath awareness to cope with ruminating thoughts, developed plan with patient to practice between sessions   Plan: Return again in 2 weeks  Diagnosis: Axis I: Generalized Anxiety Disorder      Axis II: No diagnosis Collaboration of Care: Psychiatrist AEB patient sees psychiatrist Dr. Tenny Craw in this practice for medication management  Patient/Guardian was advised Release of Information must be obtained prior to any record release in order to collaborate their care with an outside provider. Patient/Guardian was advised if they have not already done so to contact the registration department to sign all necessary forms in order for [place] to release information regarding their care.   Consent: Patient/Guardian gives verbal consent for treatment and assignment of benefits for services provided during this visit. Patient/Guardian expressed understanding and agreed to proceed.    Adah Salvage, LCSW 11/10/2022

## 2022-11-28 DIAGNOSIS — H903 Sensorineural hearing loss, bilateral: Secondary | ICD-10-CM | POA: Diagnosis not present

## 2022-12-03 DIAGNOSIS — M797 Fibromyalgia: Secondary | ICD-10-CM | POA: Diagnosis not present

## 2022-12-03 DIAGNOSIS — Z6841 Body Mass Index (BMI) 40.0 and over, adult: Secondary | ICD-10-CM | POA: Diagnosis not present

## 2022-12-03 DIAGNOSIS — M170 Bilateral primary osteoarthritis of knee: Secondary | ICD-10-CM | POA: Diagnosis not present

## 2022-12-04 ENCOUNTER — Other Ambulatory Visit (HOSPITAL_COMMUNITY): Payer: Self-pay | Admitting: Psychiatry

## 2022-12-11 DIAGNOSIS — M47816 Spondylosis without myelopathy or radiculopathy, lumbar region: Secondary | ICD-10-CM | POA: Diagnosis not present

## 2022-12-11 DIAGNOSIS — M5416 Radiculopathy, lumbar region: Secondary | ICD-10-CM | POA: Diagnosis not present

## 2022-12-15 DIAGNOSIS — M170 Bilateral primary osteoarthritis of knee: Secondary | ICD-10-CM | POA: Diagnosis not present

## 2022-12-16 ENCOUNTER — Ambulatory Visit (INDEPENDENT_AMBULATORY_CARE_PROVIDER_SITE_OTHER): Payer: BC Managed Care – PPO | Admitting: Psychiatry

## 2022-12-16 DIAGNOSIS — F321 Major depressive disorder, single episode, moderate: Secondary | ICD-10-CM

## 2022-12-16 NOTE — Progress Notes (Signed)
Virtual Visit via Telephone Note  I connected with SHAMIYA RATZLOFF on 12/16/22 at 1:10 PM EDT by telephone and verified that I am speaking with the correct person using two identifiers.  Location: Patient: Home Provider: [facility] [place] office    I discussed the limitations, risks, security and privacy concerns of performing an evaluation and management service by telephone and the availability of in person appointments. I also discussed with the patient that there may be a patient responsible charge related to this service. The patient expressed understanding and agreed to proceed.    I provided 45 minutes of non-face-to-face time during this encounter.   Adah Salvage, LCSW                                 THERAPIST PROGRESS NOTE     Session Time: Tuesday  12/16/2022 1:10 PM - 1:55 PM  Participation Level: Active  Behavioral Response: talkative  Type of Therapy: Individual Therapy  Treatment Goals addressed: Maddilynn will score less than 5 on the Generalized Anxiety Disorder 7 Scale (GAD-7)  Pt will practice relaxation techniques daily    Progress on Goals:  progressing   Interventions: CBT and Supportive  Summary: MONSERRATE WARNEKE is a 57 y.o. female who presents with symptoms of anxiety that have been lilfelong per patient's report but symptoms worsened when her mother died 15+ years ago. Also during the same 2 month period around her mother's death,her grandparents and her sister died.   Patient resumes services in 07-25-2022 as she is experiencing increased symptoms of depression and anxiety including depressed mood, crying spells, sleep difficulty, and persistent worry.  She reports passive suicidal ideations of wishing she was dead at times but denies any plan or intent to harm self.  Patient reports unresolved grief and loss issues regarding the death of her brother in 2021/07/24.  She reports increased thoughts about brother as the upcoming anniversary of his death  approaches.  She verbalizes thoughts of self blame and letting her brother as well as her mother down as she was not with brother when he died.  She also is experiencing anticipatory grief regarding her father who has prostate cancer and does not have much longer to live per patient's report.  Patient reports additional stress regarding son who has a history of substance abuse/dependence apparently overdosing 2 months ago.  Patient reports finding him on the floor and administering Narcan as well as doing CPR to bring him back.  She reports son and his girlfriend are having conflict.  She worries this may trigger a relapse for her son.  Patient reports strong family support from her other son, her husband, siblings, and friends.  They have been encouraging patient to try to get involved in activities by doing different activities with patient.  She reports feeling better once she participates in these activities.  Patient reports feeling overwhelmed.  However, she is looking forward to going on a cruise in May as well as going to [place] after she returns from cruise.  Patient last was seen about 3-4 weeks ago.  She reports increased depressed mood and continued symptoms of anxiety as reflected in the GAD-7.  Triggers include increased health concerns. Pt recently learned she needs hearing aids in both ears and is scheduled to see specialist in September.  She expresses frustration as she continues to have to talk to loud and asked people to repeat  themselves.  However, she is most worried about health issues regarding increased pain. 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 She expresses frustration this was not diagnosed earlier.  Per her report, she has been advised to see a rheumatologist but needs a referral from her PCP.  Patient fears she will become unable to walk if she does not see a rheumatologist soon.  She has tried to contact PCP but  has not received a return call.  She plans to go to PCPs office tomorrow to schedule appointment.  Patient also expresses frustration as she is unable to perform task at home in the manner and the pace she would prefer due to constant pain. She is looking forward to going to a [place] out of state next weekend and hopes this will distract her from worrying and improve her mood.  Suicidal/Homicidal: None     Therapist Response: reviewed symptoms, administered GAD-7, discussed results ,discussed stressors, facilitated expression of thoughts and feelings, validated feelings, discussed problem solving regarding getting in contact her PCP, also developed plan with patient to prepare a list of questions her concerns about her condition, also discussed possibly researching natural things patient can do such as learning more about how to manage her diet regarding foods that may or may not exacerbate issues related to arthritis assisted patient identify/challenge/and replace catastrophizing thoughts with more rational thoughts.  Plan: Return again in 2 weeks  Diagnosis: Axis I: Generalized Anxiety Disorder      Axis II: No diagnosis Collaboration of Care: Psychiatrist AEB patient sees psychiatrist Dr. Tenny Craw in this practice for medication management  Patient/Guardian was advised Release of Information must be obtained prior to any record release in order to collaborate their care with an outside provider. Patient/Guardian was advised if they have not already done so to contact the registration department to sign all necessary forms in order for [place] to release information regarding their care.   Consent: Patient/Guardian gives verbal consent for treatment and assignment of benefits for services provided during this visit. Patient/Guardian expressed understanding and agreed to proceed.    Adah Salvage, LCSW 12/16/2022

## 2023-01-13 ENCOUNTER — Encounter (HOSPITAL_COMMUNITY): Payer: Self-pay | Admitting: Psychiatry

## 2023-01-13 ENCOUNTER — Telehealth (HOSPITAL_COMMUNITY): Payer: BC Managed Care – PPO | Admitting: Psychiatry

## 2023-01-13 DIAGNOSIS — Z6841 Body Mass Index (BMI) 40.0 and over, adult: Secondary | ICD-10-CM | POA: Diagnosis not present

## 2023-01-13 DIAGNOSIS — F419 Anxiety disorder, unspecified: Secondary | ICD-10-CM | POA: Diagnosis not present

## 2023-01-13 DIAGNOSIS — M797 Fibromyalgia: Secondary | ICD-10-CM | POA: Diagnosis not present

## 2023-01-13 DIAGNOSIS — F411 Generalized anxiety disorder: Secondary | ICD-10-CM

## 2023-01-13 DIAGNOSIS — K219 Gastro-esophageal reflux disease without esophagitis: Secondary | ICD-10-CM | POA: Diagnosis not present

## 2023-01-13 DIAGNOSIS — Z0001 Encounter for general adult medical examination with abnormal findings: Secondary | ICD-10-CM | POA: Diagnosis not present

## 2023-01-13 DIAGNOSIS — R7309 Other abnormal glucose: Secondary | ICD-10-CM | POA: Diagnosis not present

## 2023-01-13 DIAGNOSIS — F321 Major depressive disorder, single episode, moderate: Secondary | ICD-10-CM

## 2023-01-13 DIAGNOSIS — Z1331 Encounter for screening for depression: Secondary | ICD-10-CM | POA: Diagnosis not present

## 2023-01-13 DIAGNOSIS — M170 Bilateral primary osteoarthritis of knee: Secondary | ICD-10-CM | POA: Diagnosis not present

## 2023-01-13 DIAGNOSIS — G4733 Obstructive sleep apnea (adult) (pediatric): Secondary | ICD-10-CM | POA: Diagnosis not present

## 2023-01-13 MED ORDER — ALPRAZOLAM 1 MG PO TABS: 1.0000 mg | ORAL_TABLET | Freq: Three times a day (TID) | ORAL | 2 refills | Status: DC

## 2023-01-13 MED ORDER — DULOXETINE HCL 60 MG PO CPEP: 60.0000 mg | ORAL_CAPSULE | Freq: Two times a day (BID) | ORAL | 2 refills | Status: DC

## 2023-01-13 MED ORDER — ZOLPIDEM TARTRATE 10 MG PO TABS: 10.0000 mg | ORAL_TABLET | Freq: Every evening | ORAL | 2 refills | Status: DC | PRN

## 2023-01-13 MED ORDER — ARIPIPRAZOLE 2 MG PO TABS: 2.0000 mg | ORAL_TABLET | ORAL | 2 refills | Status: DC

## 2023-01-13 NOTE — Progress Notes (Signed)
Virtual Visit via Telephone Note  I connected with Virginia Trujillo on 01/13/23 at 11:20 AM EDT by telephone and verified that I am speaking with the correct person using two identifiers.  Location: Patient: home Provider: office   I discussed the limitations, risks, security and privacy concerns of performing an evaluation and management service by telephone and the availability of in person appointments. I also discussed with the patient that there may be a patient responsible charge related to this service. The patient expressed understanding and agreed to proceed.      I discussed the assessment and treatment plan with the patient. The patient was provided an opportunity to ask questions and all were answered. The patient agreed with the plan and demonstrated an understanding of the instructions.   The patient was advised to call back or seek an in-person evaluation if the symptoms worsen or if the condition fails to improve as anticipated.  I provided 15 minutes of non-face-to-face time during this encounter.   Diannia Ruder, MD  [facility] MD/PA/NP OP Progress Note  01/13/2023 11:29 AM Virginia Trujillo  MRN:  [id number]  Chief Complaint:  Chief Complaint  Patient presents with   Depression   Anxiety   Follow-up   HPI: This patient is a 57 year old married black female who lives with her husband in [place].  She is currently unemployed.  The patient returns for follow-up after 3 months regarding her symptoms of depression and anxiety.  She states she has been more stressed lately because her cousin died a few weeks ago and last week her pastor died and they had a big funeral for him.  Nevertheless she has had some good things happen in her life.  She went on a trip to [place] to a nephew's wedding and she and her family are going to the beach in October.  She is having a lot of trouble with allergies and nasal congestion as well as joint pain and hip pain.  For the most part she does feel  like her medications are helping her mood.  She has gone back to the Ambien occasionally for sleep.  She denies any thoughts of self-harm or suicide Visit Diagnosis:    ICD-10-CM   1. 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 episode (HCC)  F32.1     2. Generalized anxiety disorder  F41.1       Past Psychiatric History: none  Past Medical History:  Past Medical History:  Diagnosis Date   Acid reflux    Anxiety    Asthma    Depression    Fibromyalgia    Sleep apnea     Past Surgical History:  Procedure Laterality Date   CESAREAN SECTION     CHOLECYSTECTOMY N/A 08/14/2021   Procedure: LAPAROSCOPIC CHOLECYSTECTOMY;  Surgeon: Lewie Chamber, DO;  Location: [facility];  Service: General;  Laterality: N/A;   MOUTH SURGERY     TUBAL LIGATION     tubes tied      Family Psychiatric History: See below  Family History:  Family History  Problem Relation Age of Onset   Hypertension Father    Hypertension Brother    Diabetes Brother    Depression Brother    Anxiety disorder Brother    Stroke Maternal Grandmother    Stroke Paternal Grandfather    Congestive Heart Failure Sister    Diabetes Sister    Hypertension Sister    Anxiety disorder Sister    Depression Sister  Heart disease Unknown    Arthritis Unknown    Cancer Unknown    Asthma Unknown    Diabetes Unknown    Kidney disease Unknown     Social History:  Social History   Socioeconomic History   Marital status: Married    Spouse name: Not on file   Number of children: 2   Years of education: 12   Highest education level: Not on file  Occupational History   Occupation: none    Employer: UNEMPLOYED   Occupation: Unemployed   Tobacco Use   Smoking status: Never   Smokeless tobacco: Never  Vaping Use   Vaping status: Never Used  Substance and Sexual Activity   Alcohol use: No   Drug use: No   Sexual activity: Yes    Birth control/protection: Surgical  Other Topics  Concern   Not on file  Social History Narrative   Patient lives at home with husband and son.    Patient does not work   Patient has a high school education    Patient has 2 children.   Social Determinants of Health   Financial Resource Strain: Not on file  Food Insecurity: Not on file  Transportation Needs: Not on file  Physical Activity: Not on file  Stress: Not on file  Social Connections: Not on file    Allergies:  Allergies  Allergen Reactions   Prednisone Other (See Comments)    Depression and crying   Penicillins     Metabolic Disorder Labs: No results found for: "HGBA1C", "MPG" No results found for: "PROLACTIN" No results found for: "CHOL", "TRIG", "HDL", "CHOLHDL", "VLDL", "LDLCALC" Lab Results  Component Value Date   TSH 3.190 11/29/2013    Therapeutic Level Labs: No results found for: "LITHIUM" No results found for: "VALPROATE" No results found for: "CBMZ"  Current Medications: Current Outpatient Medications  Medication Sig Dispense Refill   albuterol (PROVENTIL) (2.5 MG/3ML) 0.083% nebulizer solution Take 2.5 mg by nebulization every 6 (six) hours as needed for wheezing or shortness of breath.     albuterol (VENTOLIN HFA) 108 (90 Base) MCG/ACT inhaler Inhale 2 puffs into the lungs every 4 (four) hours as needed for wheezing or shortness of breath.     ALPRAZolam (XANAX) 1 MG tablet Take 1 tablet (1 mg total) by mouth 3 (three) times daily. 90 tablet 2   ARIPiprazole (ABILIFY) 2 MG tablet Take 1 tablet (2 mg total) by mouth every morning. 30 tablet 2   baclofen (LIORESAL) 10 MG tablet Take 10 mg by mouth 3 (three) times daily.     DULoxetine (CYMBALTA) 60 MG capsule Take 1 capsule (60 mg total) by mouth 2 (two) times daily. 60 capsule 2   montelukast (SINGULAIR) 10 MG tablet Take 10 mg by mouth daily.     pantoprazole (PROTONIX) 40 MG tablet Take 40 mg by mouth daily.     traMADol-acetaminophen (ULTRACET) 37.5-325 MG tablet Take 2 tablets by mouth every 6  (six) hours as needed for moderate pain or severe pain.     zolpidem (AMBIEN) 10 MG tablet Take 1 tablet (10 mg total) by mouth at bedtime as needed for sleep. 30 tablet 2   No current facility-administered medications for this visit.     Musculoskeletal: Strength & Muscle Tone: na Gait & Station: na Patient leans: N/A  Psychiatric Specialty Exam: Review of Systems  HENT:  Positive for congestion and sneezing.   Musculoskeletal:  Positive for arthralgias.  All other systems reviewed and are  negative.   There were no vitals taken for this visit.There is no height or weight on file to calculate BMI.  General Appearance: NA  Eye Contact:  NA  Speech:  Clear and Coherent  Volume:  Normal  Mood:  Euthymic  Affect:  na  Thought Process:  Goal Directed  Orientation:  Full (Time, Place, and Person)  Thought Content: Rumination   Suicidal Thoughts:  No  Homicidal Thoughts:  No  Memory:  Immediate;   Good Recent;   Good Remote;   NA  Judgement:  Good  Insight:  Fair  Psychomotor Activity:  Decreased  Concentration:  Concentration: Fair and Attention Span: Fair  Recall:  Fair  Fund of Knowledge: Good  Language: Good  Akathisia:  No  Handed:  Right  AIMS (if indicated): not done  Assets:  Communication Skills Desire for Improvement Resilience Social Support  ADL's:  Intact  Cognition: WNL  Sleep:  Fair   Screenings: GAD-7    Advertising copywriter from 12/16/2022 in [facility] at [place] Counselor from 11/10/2022 in [facility] [place] Counselor from 10/24/2022 [facility] at [place] Counselor from 08/08/2022 in [facility] at [place] Counselor from 11/10/2017 in [facility] at [place]  Total GAD-7 Score 21 21 21 21 19       PHQ2-9    Flowsheet Row Counselor from 08/08/2022 in [facility] at [place] Counselor from 07/01/2022 in [facility] at [place] Video Visit from 12/16/2021 in [facility] at [place] Video Visit from 10/02/2021 in [facility] at [place] Counselor from 09/03/2021 in [facility] at [facility] Total Score 4 5 1 1 3   PHQ-9 Total Score 14 23 -- -- 13      Flowsheet Row ED from 06/21/2022 in [facility] Video Visit from 12/16/2021 in [facility] at [place] Video Visit from 10/02/2021 in [facility] at [place]  C-SSRS RISK CATEGORY No Risk No Risk No Risk        Assessment and Plan: This patient is a 57 year old female with history of depression and anxiety.  For the most part she is doing well on her current regimen.  She will continue Cymbalta 60 mg twice daily for depression, Abilify 2 mg daily for augmentation, Xanax 1 mg up to 3 times daily for anxiety and Ambien 10 mg as needed for sleep.  She will return to see me in 3 months  Collaboration of Care: Collaboration of Care: Referral or follow-up with counselor/therapist AEB patient will continue therapy with Florencia Reasons in our office  Patient/Guardian was advised Release of Information must be obtained prior to any record release in order to collaborate their care with an outside provider. Patient/Guardian was advised if they have not already done so to contact the registration department to sign all necessary forms in order for [place] to release information regarding their care.   Consent: Patient/Guardian gives verbal consent for treatment and assignment of benefits for services provided during this visit. Patient/Guardian expressed understanding and agreed to proceed.    Diannia Ruder, MD 01/13/2023, 11:29 AM

## 2023-01-15 ENCOUNTER — Encounter: Payer: BC Managed Care – PPO | Admitting: Internal Medicine

## 2023-01-15 NOTE — Progress Notes (Signed)
Erroneous encounter - please disregard.

## 2023-01-16 ENCOUNTER — Encounter: Payer: Self-pay | Admitting: Internal Medicine

## 2023-01-20 DIAGNOSIS — M47816 Spondylosis without myelopathy or radiculopathy, lumbar region: Secondary | ICD-10-CM | POA: Diagnosis not present

## 2023-02-17 DIAGNOSIS — M47816 Spondylosis without myelopathy or radiculopathy, lumbar region: Secondary | ICD-10-CM | POA: Diagnosis not present

## 2023-03-02 DIAGNOSIS — M170 Bilateral primary osteoarthritis of knee: Secondary | ICD-10-CM | POA: Diagnosis not present

## 2023-03-02 DIAGNOSIS — M25562 Pain in left knee: Secondary | ICD-10-CM | POA: Diagnosis not present

## 2023-03-02 DIAGNOSIS — M25561 Pain in right knee: Secondary | ICD-10-CM | POA: Diagnosis not present

## 2023-03-06 DIAGNOSIS — M170 Bilateral primary osteoarthritis of knee: Secondary | ICD-10-CM | POA: Diagnosis not present

## 2023-03-11 DIAGNOSIS — M47816 Spondylosis without myelopathy or radiculopathy, lumbar region: Secondary | ICD-10-CM | POA: Diagnosis not present

## 2023-03-20 ENCOUNTER — Ambulatory Visit (HOSPITAL_COMMUNITY): Payer: BC Managed Care – PPO | Admitting: Psychiatry

## 2023-03-20 DIAGNOSIS — F411 Generalized anxiety disorder: Secondary | ICD-10-CM

## 2023-03-20 NOTE — Progress Notes (Signed)
Virtual Visit via Telephone Note  I connected with Virginia Trujillo on 03/20/23 at 9:10 AM EST by telephone and verified that I am speaking with the correct person using two identifiers.  Location: Patient: Home Provider: [facility] Out[facility] [place] office    I discussed the limitations, risks, security and privacy concerns of performing an evaluation and management service by telephone and the availability of in person appointments. I also discussed with the patient that there may be a patient responsible charge related to this service. The patient expressed understanding and agreed to proceed.    I provided 41 minutes of non-face-to-face time during this encounter.   Adah Salvage, LCSW                                THERAPIST PROGRESS NOTE     Session Time: Friday  03/20/2023 9:10 AM - 9:51 PM   Participation Level: Active  Behavioral Response: talkative  Type of Therapy: Individual Therapy  Treatment Goals addressed: [place] will score less than 5 on the Generalized Anxiety Disorder 7 Scale (GAD-7)  Pt will practice relaxation techniques daily    Progress on Goals:  progressing   Interventions: CBT and Supportive  Summary: Virginia Trujillo is a 57 y.o. female who presents with symptoms of anxiety that have been lilfelong per patient's report but symptoms worsened when her mother died 15+ years ago. Also during the same 2 month period around her mother's death,her grandparents and her sister died.   Patient resumes services in 23-Aug-2022 as she is experiencing increased symptoms of depression and anxiety including depressed mood, crying spells, sleep difficulty, and persistent worry.  She reports passive suicidal ideations of wishing she was dead at times but denies any plan or intent to harm self.  Patient reports unresolved grief and loss issues regarding the death of her brother in 08-22-2021.  She reports increased thoughts about brother as the upcoming anniversary of his death  approaches.  She verbalizes thoughts of self blame and letting her brother as well as her mother down as she was not with brother when he died.  She also is experiencing anticipatory grief regarding her father who has prostate cancer and does not have much longer to live per patient's report.  Patient reports additional stress regarding son who has a history of substance abuse/dependence apparently overdosing 2 months ago.  Patient reports finding him on the floor and administering Narcan as well as doing CPR to bring him back.  She reports son and his girlfriend are having conflict.  She worries this may trigger a relapse for her son.  Patient reports strong family support from her other son, her husband, siblings, and friends.  They have been encouraging patient to try to get involved in activities by doing different activities with patient.  She reports feeling better once she participates in these activities.  Patient reports feeling overwhelmed.  However, she is looking forward to going on a cruise in May as well as going to [place] after she returns from cruise.  Patient last was seen about 3 months ago.  She reports increased depressed mood and increased symptoms of anxiety as reflected in the GAD-7.  Triggers include increased health concerns particularly increased knee and back pain.  Patient is working with her providers regarding injections for her knees and a laser procedure for her back.  She now reports consistently using a cane to walk.  She expresses frustration as she is not unable to perform activities like she normally does.  She also reports increased sleep difficulty waking up several times per night as well as increased irritability.   Suicidal/Homicidal: None     Therapist Response: reviewed symptoms, administered GAD-7, discussed results ,discussed stressors, facilitated expression of thoughts and feelings, validated feelings, discussed rationale for and assisted patient develop pain  level/activity chart to help patient increase confidence in her ability to manage pain, also reviewed relaxation techniques including deep breathing and meditation, developed plan with patient to use strategies discussed in session    Plan: Return again in 2 weeks  Diagnosis: Axis I: Generalized Anxiety Disorder      Axis II: No diagnosis Collaboration of Care: Psychiatrist AEB patient sees psychiatrist Dr. Tenny Craw in this practice for medication management  Patient/Guardian was advised Release of Information must be obtained prior to any record release in order to collaborate their care with an outside provider. Patient/Guardian was advised if they have not already done so to contact the registration department to sign all necessary forms in order for [place] to release information regarding their care.   Consent: Patient/Guardian gives verbal consent for treatment and assignment of benefits for services provided during this visit. Patient/Guardian expressed understanding and agreed to proceed.    Adah Salvage, LCSW 03/20/2023

## 2023-04-06 ENCOUNTER — Ambulatory Visit (INDEPENDENT_AMBULATORY_CARE_PROVIDER_SITE_OTHER): Payer: BC Managed Care – PPO | Admitting: Psychiatry

## 2023-04-06 DIAGNOSIS — F411 Generalized anxiety disorder: Secondary | ICD-10-CM

## 2023-04-06 DIAGNOSIS — F321 Major depressive disorder, single episode, moderate: Secondary | ICD-10-CM | POA: Diagnosis not present
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
## 2023-04-08 DIAGNOSIS — Z6841 Body Mass Index (BMI) 40.0 and over, adult: Secondary | ICD-10-CM | POA: Diagnosis not present

## 2023-04-08 DIAGNOSIS — M47816 Spondylosis without myelopathy or radiculopathy, lumbar region: Secondary | ICD-10-CM | POA: Diagnosis not present

## 2023-04-14 ENCOUNTER — Encounter (HOSPITAL_COMMUNITY): Payer: Self-pay | Admitting: Psychiatry

## 2023-04-14 ENCOUNTER — Telehealth (HOSPITAL_COMMUNITY): Payer: BC Managed Care – PPO | Admitting: Psychiatry

## 2023-04-14 DIAGNOSIS — F411 Generalized anxiety disorder: Secondary | ICD-10-CM | POA: Diagnosis not present

## 2023-04-14 DIAGNOSIS — F321 Major depressive disorder, single episode, moderate: Secondary | ICD-10-CM | POA: Diagnosis not present

## 2023-04-14 MED ORDER — ARIPIPRAZOLE 2 MG PO TABS: 2.0000 mg | ORAL_TABLET | ORAL | 2 refills | Status: DC

## 2023-04-14 MED ORDER — ALPRAZOLAM 1 MG PO TABS: 1.0000 mg | ORAL_TABLET | Freq: Three times a day (TID) | ORAL | 2 refills | Status: DC

## 2023-04-14 MED ORDER — ZOLPIDEM TARTRATE 10 MG PO TABS: 10.0000 mg | ORAL_TABLET | Freq: Every evening | ORAL | 2 refills | Status: DC | PRN

## 2023-04-14 MED ORDER — DULOXETINE HCL 60 MG PO CPEP: 60.0000 mg | ORAL_CAPSULE | Freq: Two times a day (BID) | ORAL | 2 refills | Status: DC

## 2023-04-14 NOTE — Progress Notes (Signed)
Virtual Visit via Telephone Note  I connected with Virginia Trujillo on 04/14/23 at  9:20 AM EST by telephone and verified that I am speaking with the correct person using two identifiers.  Location: Patient: home Provider: office   I discussed the limitations, risks, security and privacy concerns of performing an evaluation and management service by telephone and the availability of in person appointments. I also discussed with the patient that there may be a patient responsible charge related to this service. The patient expressed understanding and agreed to proceed.      I discussed the assessment and treatment plan with the patient. The patient was provided an opportunity to ask questions and all were answered. The patient agreed with the plan and demonstrated an understanding of the instructions.   The patient was advised to call back or seek an in-person evaluation if the symptoms worsen or if the condition fails to improve as anticipated.  I provided 20 minutes of non-face-to-face time during this encounter.   Diannia Ruder, MD  [facility] MD/PA/NP OP Progress Note  04/14/2023 9:42 AM Virginia Trujillo  MRN:  [id number]  Chief Complaint:  Chief Complaint  Patient presents with   Anxiety   Depression   Follow-up   HPI: This patient is a 57 year old married black female who lives with her husband in [place].  She is unemployed.  The patient returns for follow-up after 3 months regarding her depression and anxiety.  For the most part she has been stable.  She still having a lot of problems with chronic pain.  Her back pain is better since receiving an injection but her knee pain seems to be worse.  She also complains of her arms and legs swelling.  I explained that this could be a result of congestive heart failure and she should be seen in the emergency room if it happens again.  She states that it subsided this morning.  For the most part she feels like the medications for her mood and sleep  are helping.  She denies any thoughts of self-harm or suicide Visit Diagnosis:    ICD-10-CM   1. Generalized anxiety disorder  F41.1     2. 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 episode (HCC)  F32.1       Past Psychiatric History: none  Past Medical History:  Past Medical History:  Diagnosis Date   Acid reflux    Anxiety    Asthma    Depression    Fibromyalgia    Sleep apnea     Past Surgical History:  Procedure Laterality Date   CESAREAN SECTION     CHOLECYSTECTOMY N/A 08/14/2021   Procedure: LAPAROSCOPIC CHOLECYSTECTOMY;  Surgeon: Lewie Chamber, DO;  Location: [facility];  Service: General;  Laterality: N/A;   MOUTH SURGERY     TUBAL LIGATION     tubes tied      Family Psychiatric History: See below  Family History:  Family History  Problem Relation Age of Onset   Hypertension Father    Hypertension Brother    Diabetes Brother    Depression Brother    Anxiety disorder Brother    Stroke Maternal Grandmother    Stroke Paternal Grandfather    Congestive Heart Failure Sister    Diabetes Sister    Hypertension Sister    Anxiety disorder Sister    Depression Sister    Heart disease Unknown    Arthritis Unknown    Cancer Unknown  Asthma Unknown    Diabetes Unknown    Kidney disease Unknown     Social History:  Social History   Socioeconomic History   Marital status: Married    Spouse name: Not on file   Number of children: 2   Years of education: 12   Highest education level: Not on file  Occupational History   Occupation: none    Employer: UNEMPLOYED   Occupation: Unemployed   Tobacco Use   Smoking status: Never   Smokeless tobacco: Never  Vaping Use   Vaping status: Never Used  Substance and Sexual Activity   Alcohol use: No   Drug use: No   Sexual activity: Yes    Birth control/protection: Surgical  Other Topics Concern   Not on file  Social History Narrative   Patient lives at home with husband and  son.    Patient does not work   Patient has a high school education    Patient has 2 children.   Social Determinants of Health   Financial Resource Strain: Not on file  Food Insecurity: Not on file  Transportation Needs: Not on file  Physical Activity: Not on file  Stress: Not on file  Social Connections: Not on file    Allergies:  Allergies  Allergen Reactions   Prednisone Other (See Comments)    Depression and crying   Penicillins     Metabolic Disorder Labs: No results found for: "HGBA1C", "MPG" No results found for: "PROLACTIN" No results found for: "CHOL", "TRIG", "HDL", "CHOLHDL", "VLDL", "LDLCALC" Lab Results  Component Value Date   TSH 3.190 11/29/2013    Therapeutic Level Labs: No results found for: "LITHIUM" No results found for: "VALPROATE" No results found for: "CBMZ"  Current Medications: Current Outpatient Medications  Medication Sig Dispense Refill   albuterol (PROVENTIL) (2.5 MG/3ML) 0.083% nebulizer solution Take 2.5 mg by nebulization every 6 (six) hours as needed for wheezing or shortness of breath.     albuterol (VENTOLIN HFA) 108 (90 Base) MCG/ACT inhaler Inhale 2 puffs into the lungs every 4 (four) hours as needed for wheezing or shortness of breath.     ALPRAZolam (XANAX) 1 MG tablet Take 1 tablet (1 mg total) by mouth 3 (three) times daily. 90 tablet 2   ARIPiprazole (ABILIFY) 2 MG tablet Take 1 tablet (2 mg total) by mouth every morning. 30 tablet 2   baclofen (LIORESAL) 10 MG tablet Take 10 mg by mouth 3 (three) times daily.     DULoxetine (CYMBALTA) 60 MG capsule Take 1 capsule (60 mg total) by mouth 2 (two) times daily. 60 capsule 2   montelukast (SINGULAIR) 10 MG tablet Take 10 mg by mouth daily.     pantoprazole (PROTONIX) 40 MG tablet Take 40 mg by mouth daily.     traMADol-acetaminophen (ULTRACET) 37.5-325 MG tablet Take 2 tablets by mouth every 6 (six) hours as needed for moderate pain or severe pain.     zolpidem (AMBIEN) 10 MG tablet  Take 1 tablet (10 mg total) by mouth at bedtime as needed for sleep. 30 tablet 2   No current facility-administered medications for this visit.     Musculoskeletal: Strength & Muscle Tone: na Gait & Station: na Patient leans: N/A  Psychiatric Specialty Exam: Review of Systems  Cardiovascular:  Positive for leg swelling.  Musculoskeletal:  Positive for arthralgias and joint swelling.  All other systems reviewed and are negative.   There were no vitals taken for this visit.There is no height or  weight on file to calculate BMI.  General Appearance: NA  Eye Contact:  NA  Speech:  Clear and Coherent  Volume:  Normal  Mood:  Anxious and Euthymic  Affect:  NA  Thought Process:  NA  Orientation:  Full (Time, Place, and Person)  Thought Content: Rumination   Suicidal Thoughts:  No  Homicidal Thoughts:  No  Memory:  Immediate;   Good Recent;   Good Remote;   NA  Judgement:  Good  Insight:  Fair  Psychomotor Activity:  Decreased  Concentration:  Concentration: Fair and Attention Span: Fair  Recall:  Good  Fund of Knowledge: Fair  Language: Good  Akathisia:  No  Handed:  Right  AIMS (if indicated): not done  Assets:  Communication Skills Desire for Improvement Resilience Social Support Talents/Skills  ADL's:  Intact  Cognition: WNL  Sleep:  Good   Screenings: GAD-7    Advertising copywriter from 04/06/2023 in [facility] [place] Counselor from 03/20/2023 in [facility] [place] Counselor from 12/16/2022 in [facility] [place] Counselor from 11/10/2022 in [facility] [place] Counselor from 10/24/2022 in [facility] [place]  Total GAD-7 Score 21 21 21 21 21       PHQ2-9    Flowsheet Row Counselor from 04/06/2023 in [facility] [place] Counselor from 08/08/2022 in [facility] [place] Counselor from 07/01/2022 in [facility] at [place] Video Visit from 12/16/2021 in [facility] at [place] Video Visit from 10/02/2021 in [facility] Total Score 5 4 5 1 1   PHQ-9 Total Score 17 14 23  -- --      Flowsheet Row Counselor from 04/06/2023 in [facility] [place] ED from 06/21/2022 in [facility] at [facility] Video Visit from 12/16/2021 in [facility] at [place]  C-SSRS RISK CATEGORY Moderate Risk No Risk No Risk        Assessment and Plan: This patient is a 57 year old female with a history of depression and anxiety.  She is dealing with chronic pain as well as losses of several family members but she has just started therapy.  She does have an appointment with her PCP to discuss the pain.  She will continue Cymbalta 60 mg twice daily for depression, Abilify 2 mg daily for augmentation, Xanax 1 mg up to 3 times daily as needed for anxiety and Ambien 10 mg as needed for sleep.  She will return to see me in 3 months  Collaboration of Care: Collaboration of Care: Referral or follow-up with counselor/therapist AEB patient will continue therapy with Florencia Reasons in our office  Patient/Guardian was advised Release of Information must be obtained prior to any record release in order to collaborate their care with an outside provider. Patient/Guardian was advised if they have not already done so to contact the registration department to sign all necessary forms in order for [place] to release information regarding their care.   Consent: Patient/Guardian gives verbal consent for treatment and assignment of benefits for services provided during this visit. Patient/Guardian expressed understanding and agreed to proceed.    Diannia Ruder,  MD 04/14/2023, 9:42 AM

## 2023-04-20 ENCOUNTER — Ambulatory Visit (INDEPENDENT_AMBULATORY_CARE_PROVIDER_SITE_OTHER): Payer: BC Managed Care – PPO | Admitting: Psychiatry

## 2023-04-20 DIAGNOSIS — F321 Major depressive disorder, single episode, moderate: Secondary | ICD-10-CM

## 2023-04-20 DIAGNOSIS — F411 Generalized anxiety disorder: Secondary | ICD-10-CM

## 2023-04-20 NOTE — Progress Notes (Signed)
Virtual Visit via Telephone Note  I connected with DESERA DUMMER on 04/20/23 at 9:10 AM EST by telephone and verified that I am speaking with the correct person using two identifiers.  Location: Patient: Home Provider: [facility] [place] office    I discussed the limitations, risks, security and privacy concerns of performing an evaluation and management service by telephone and the availability of in person appointments. I also discussed with the patient that there may be a patient responsible charge related to this service. The patient expressed understanding and agreed to proceed.    I provided 43 minutes of non-face-to-face time during this encounter.   Adah Salvage, LCSW                                THERAPIST PROGRESS NOTE     Session Time: Monday 04/20/2023 9:12 AM -  9:55 AM   Participation Level: Active  Behavioral Response: talkative  Type of Therapy: Individual Therapy  Treatment Goals addressed: Shamyah will score less than 5 on the Generalized Anxiety Disorder 7 Scale (GAD-7)  Pt will practice relaxation techniques daily    Progress on Goals:  progressing   Interventions: CBT and Supportive  Summary: ADDELYNN BAYES is a 57 y.o. female who presents with symptoms of anxiety that have been lilfelong per patient's report but symptoms worsened when her mother died 15+ years ago. Also during the same 2 month period around her mother's death,her grandparents and her sister died. .Patient reports seeing a psyhiatrist once in early adolescence due to isolative behaviors and anxiety. She is seeing psychiatrist Dr. Tenny Craw. Patient reports no psychiatric hospitalizations. She has been seen intermittently by this clinician for several years.  Patient's reports still experiencing depression and anxiety is constantly worrying.  She is particularly stressed now about her youngest son who has legal issues.  She fears he may go to jail.  She also recently learned he is  going to have another baby.  She also has what if thoughts about various other issues especially about her father dying.  Patient last was seen about 2 weeks ago for an assessment of.  She reports some decrease in symptoms of anxiety-7.  She attributes this to thought stopping and trying to engage in distracting activities like crafts and being involved with her grandchildren.  She also has made plans to be involved in.  She is particularly stressed and anxious today as she worries about her son who has to go to court this Thursday.  She fears he may receive a 6 months to a year sentence and actually have to go to jail that day.  She and he have talked about that turning to try to have the case continued until after the holidays. She worries about the effects on his children.      Suicidal/Homicidal: None     Therapist Response: reviewed symptoms, administered GAD-7, discussed results ,discussed stressors, facilitated expression of thoughts and feelings, validated feelings, reviewed treatment plan, sent patient copy of treatment plan and signature page via MyChart, assisted patient examine her worry thoughts, assisted patient identify how she would handle it if case scenario would come true , praised and reinforced patient's involvement in activities, encouraged patient to maintain consistent involvement, encouraged patient to continue practicing relaxation techniques  Plan: Return again in 2 weeks  Diagnosis: Axis I: Generalized Anxiety Disorder      Axis II: No diagnosis Collaboration of  Care: Psychiatrist AEB patient sees psychiatrist Dr. Tenny Craw in this practice for medication management  Patient/Guardian was advised Release of Information must be obtained prior to any record release in order to collaborate their care with an outside provider. Patient/Guardian was advised if they have not already done so to contact the registration department to sign all necessary forms in order for [place] to release  information regarding their care.   Consent: Patient/Guardian gives verbal consent for treatment and assignment of benefits for services provided during this visit. Patient/Guardian expressed understanding and agreed to proceed.    Adah Salvage, LCSW 04/20/2023

## 2023-04-23 ENCOUNTER — Other Ambulatory Visit (HOSPITAL_COMMUNITY): Payer: Self-pay | Admitting: Family Medicine

## 2023-04-23 DIAGNOSIS — Z6841 Body Mass Index (BMI) 40.0 and over, adult: Secondary | ICD-10-CM | POA: Diagnosis not present

## 2023-04-23 DIAGNOSIS — M7989 Other specified soft tissue disorders: Secondary | ICD-10-CM | POA: Diagnosis not present

## 2023-04-30 ENCOUNTER — Ambulatory Visit (HOSPITAL_COMMUNITY)
Admission: RE | Admit: 2023-04-30 | Discharge: 2023-04-30 | Disposition: A | Discharge status: Home / Self Care | Payer: BC Managed Care – PPO | Source: Ambulatory Visit | Attending: Family Medicine | Admitting: Family Medicine

## 2023-04-30 DIAGNOSIS — M7989 Other specified soft tissue disorders: Secondary | ICD-10-CM

## 2023-05-05 DIAGNOSIS — G4733 Obstructive sleep apnea (adult) (pediatric): Secondary | ICD-10-CM | POA: Diagnosis not present

## 2023-05-21 ENCOUNTER — Ambulatory Visit (HOSPITAL_COMMUNITY): Payer: BC Managed Care – PPO | Admitting: Psychiatry

## 2023-05-30 ENCOUNTER — Other Ambulatory Visit: Payer: Self-pay

## 2023-05-30 ENCOUNTER — Emergency Department (HOSPITAL_COMMUNITY)
Admission: EM | Admit: 2023-05-30 | Discharge: 2023-05-30 | Disposition: A | Discharge status: Home / Self Care | Payer: BC Managed Care – PPO | Attending: Emergency Medicine | Admitting: Emergency Medicine

## 2023-05-30 ENCOUNTER — Emergency Department (HOSPITAL_COMMUNITY): Payer: BC Managed Care – PPO

## 2023-05-30 ENCOUNTER — Encounter (HOSPITAL_COMMUNITY): Payer: Self-pay

## 2023-05-30 DIAGNOSIS — J101 Influenza due to other identified influenza virus with other respiratory manifestations: Secondary | ICD-10-CM | POA: Diagnosis not present

## 2023-05-30 DIAGNOSIS — J45909 Unspecified asthma, uncomplicated: Secondary | ICD-10-CM | POA: Insufficient documentation

## 2023-05-30 DIAGNOSIS — Z20822 Contact with and (suspected) exposure to covid-19: Secondary | ICD-10-CM | POA: Diagnosis not present

## 2023-05-30 DIAGNOSIS — R0789 Other chest pain: Secondary | ICD-10-CM | POA: Diagnosis not present

## 2023-05-30 DIAGNOSIS — R791 Abnormal coagulation profile: Secondary | ICD-10-CM | POA: Insufficient documentation

## 2023-05-30 DIAGNOSIS — K760 Fatty (change of) liver, not elsewhere classified: Secondary | ICD-10-CM | POA: Diagnosis not present

## 2023-05-30 DIAGNOSIS — Z7951 Long term (current) use of inhaled steroids: Secondary | ICD-10-CM | POA: Diagnosis not present

## 2023-05-30 DIAGNOSIS — R059 Cough, unspecified: Secondary | ICD-10-CM | POA: Diagnosis not present

## 2023-05-30 DIAGNOSIS — R0989 Other specified symptoms and signs involving the circulatory and respiratory systems: Secondary | ICD-10-CM | POA: Diagnosis not present

## 2023-05-30 DIAGNOSIS — R0602 Shortness of breath: Secondary | ICD-10-CM | POA: Diagnosis not present

## 2023-05-30 DIAGNOSIS — J111 Influenza due to unidentified influenza virus with other respiratory manifestations: Secondary | ICD-10-CM

## 2023-05-30 LAB — CBC WITH DIFFERENTIAL/PLATELET
Abs Immature Granulocytes: 0.01 10*3/uL (ref 0.00–0.07)
Basophils Absolute: 0 10*3/uL (ref 0.0–0.1)
Basophils Relative: 1 %
Eosinophils Absolute: 0 10*3/uL (ref 0.0–0.5)
Eosinophils Relative: 0 %
HCT: 39.5 % (ref 36.0–46.0)
Hemoglobin: 12.9 g/dL (ref 12.0–15.0)
Immature Granulocytes: 0 %
Lymphocytes Relative: 22 %
Lymphs Abs: 1.1 10*3/uL (ref 0.7–4.0)
MCH: 31.6 pg (ref 26.0–34.0)
MCHC: 32.7 g/dL (ref 30.0–36.0)
MCV: 96.8 fL (ref 80.0–100.0)
Monocytes Absolute: 0.7 10*3/uL (ref 0.1–1.0)
Monocytes Relative: 13 %
Neutro Abs: 3.4 10*3/uL (ref 1.7–7.7)
Neutrophils Relative %: 64 %
Platelets: 151 10*3/uL (ref 150–400)
RBC: 4.08 MIL/uL (ref 3.87–5.11)
RDW: 12.3 % (ref 11.5–15.5)
WBC: 5.3 10*3/uL (ref 4.0–10.5)
nRBC: 0 % (ref 0.0–0.2)

## 2023-05-30 LAB — BASIC METABOLIC PANEL
Anion gap: 14 (ref 5–15)
BUN: 10 mg/dL (ref 6–20)
CO2: 23 mmol/L (ref 22–32)
Calcium: 9.3 mg/dL (ref 8.9–10.3)
Chloride: 100 mmol/L (ref 98–111)
Creatinine, Ser: 1.03 mg/dL — ABNORMAL HIGH (ref 0.44–1.00)
GFR, Estimated: >60 mL/min (ref >60)
Glucose, Bld: 100 mg/dL — ABNORMAL HIGH (ref 70–99)
Potassium: 3.6 mmol/L (ref 3.5–5.1)
Sodium: 137 mmol/L (ref 135–145)

## 2023-05-30 LAB — RESP PANEL BY RT-PCR (RSV, FLU A&B, COVID)  RVPGX2
Influenza A by PCR: POSITIVE — AB
Influenza B by PCR: NEGATIVE
Resp Syncytial Virus by PCR: NEGATIVE
SARS Coronavirus 2 by RT PCR: NEGATIVE

## 2023-05-30 LAB — D-DIMER, QUANTITATIVE: D-Dimer, Quant: 0.93 ug{FEU}/mL — ABNORMAL HIGH (ref 0.00–0.50)

## 2023-05-30 LAB — TROPONIN I (HIGH SENSITIVITY)
Troponin I (High Sensitivity): 3 ng/L (ref <18)
Troponin I (High Sensitivity): 3 ng/L (ref <18)

## 2023-05-30 MED ORDER — ALBUTEROL SULFATE HFA 108 (90 BASE) MCG/ACT IN AERS
2.0000 | INHALATION_SPRAY | Freq: Once | RESPIRATORY_TRACT | Status: AC
  Administered 2023-05-30: 2 via RESPIRATORY_TRACT
  Filled 2023-05-30: qty 6.7

## 2023-05-30 MED ORDER — ALBUTEROL SULFATE HFA 108 (90 BASE) MCG/ACT IN AERS: 2.0000 | INHALATION_SPRAY | RESPIRATORY_TRACT | Status: DC | PRN

## 2023-05-30 MED ORDER — OSELTAMIVIR PHOSPHATE 75 MG PO CAPS: 75.0000 mg | ORAL_CAPSULE | Freq: Two times a day (BID) | ORAL | 0 refills | Status: DC

## 2023-05-30 MED ORDER — ACETAMINOPHEN 325 MG PO TABS
650.0000 mg | ORAL_TABLET | Freq: Once | ORAL | Status: AC
  Administered 2023-05-30: 650 mg via ORAL
  Filled 2023-05-30: qty 2

## 2023-05-30 MED ORDER — BENZONATATE 200 MG PO CAPS: 200.0000 mg | ORAL_CAPSULE | Freq: Three times a day (TID) | ORAL | 0 refills | Status: AC | PRN

## 2023-05-30 MED ORDER — IOHEXOL 350 MG/ML SOLN
75.0000 mL | Freq: Once | INTRAVENOUS | Status: AC | PRN
  Administered 2023-05-30: 75 mL via INTRAVENOUS

## 2023-05-30 NOTE — ED Triage Notes (Signed)
Pt arrived via POV c/o SOB, decreased appetite, cough, congestions and sinus pressure. Pt reports symptoms began 2 days ago.

## 2023-05-30 NOTE — ED Notes (Signed)
Patient taken to CT at this time.

## 2023-05-30 NOTE — ED Provider Notes (Signed)
[place] EMERGENCY DEPARTMENT AT [facility] Provider Note   CSN: [id number] Arrival date & time: 05/30/23  1531     History  Chief Complaint  Patient presents with   Shortness of Breath    Virginia Trujillo is a 58 y.o. female.   Shortness of Breath Associated symptoms: chest pain and cough   Associated symptoms: no abdominal pain, no fever, no headaches, no neck pain and no vomiting        Virginia Trujillo is a 58 y.o. female with past medical history of anxiety, asthma, fibromyalgia, acid reflux who presents to the Emergency Department complaining of chest tightness and shortness of breath, decreased appetite, cough, and sinus pressure.  Symptoms began 2 days ago, worse yesterday.  Cough described as productive of yellow sputum.  Shortness of breath with exertion.  Chest feels tight but only when coughing.  She denies any nausea vomiting or diarrhea.  Subjective chills but no known fever.  She denies any known sick contacts.  Home Medications Prior to Admission medications   Medication Sig Start Date End Date Taking? Authorizing Provider  albuterol (PROVENTIL) (2.5 MG/3ML) 0.083% nebulizer solution Take 2.5 mg by nebulization every 6 (six) hours as needed for wheezing or shortness of breath.    [provider]  albuterol (VENTOLIN HFA) 108 (90 Base) MCG/ACT inhaler Inhale 2 puffs into the lungs every 4 (four) hours as needed for wheezing or shortness of breath. 06/06/21   [provider]  ALPRAZolam Prudy Feeler) 1 MG tablet Take 1 tablet (1 mg total) by mouth 3 (three) times daily. 04/14/23   Myrlene Broker, MD  ARIPiprazole (ABILIFY) 2 MG tablet Take 1 tablet (2 mg total) by mouth every morning. 04/14/23   Myrlene Broker, MD  baclofen (LIORESAL) 10 MG tablet Take 10 mg by mouth 3 (three) times daily.    [provider]  DULoxetine (CYMBALTA) 60 MG capsule Take 1 capsule (60 mg total) by mouth 2 (two) times daily. 04/14/23   Myrlene Broker, MD   montelukast (SINGULAIR) 10 MG tablet Take 10 mg by mouth daily. 08/02/21   [provider]  pantoprazole (PROTONIX) 40 MG tablet Take 40 mg by mouth daily. 08/02/21   [provider]  traMADol-acetaminophen (ULTRACET) 37.5-325 MG tablet Take 2 tablets by mouth every 6 (six) hours as needed for moderate pain or severe pain. 12/04/21   [provider]  zolpidem (AMBIEN) 10 MG tablet Take 1 tablet (10 mg total) by mouth at bedtime as needed for sleep. 04/14/23 07/13/23  Myrlene Broker, MD      Allergies    Prednisone and Penicillins    Review of Systems   Review of Systems  Constitutional:  Positive for appetite change and chills. Negative for fever.  Respiratory:  Positive for cough, chest tightness and shortness of breath.   Cardiovascular:  Positive for chest pain. Negative for leg swelling.  Gastrointestinal:  Negative for abdominal pain, diarrhea, nausea and vomiting.  Genitourinary:  Negative for difficulty urinating and dysuria.  Musculoskeletal:  Positive for myalgias. Negative for back pain and neck pain.  Neurological:  Negative for dizziness and headaches.    Physical Exam Updated Vital Signs BP 110/67 (BP Location: Right Arm)   Pulse 89   Temp 99.8 F (37.7 C) (Oral)   Resp 16   Ht 5\' 3"  (1.6 m)   Wt 120 kg   SpO2 96%   BMI 46.86 kg/m  Physical Exam Vitals and nursing  note reviewed.  Constitutional:      General: She is not in acute distress.    Appearance: Normal appearance. She is well-developed. She is not ill-appearing or toxic-appearing.  HENT:     Mouth/Throat:     Mouth: Mucous membranes are moist.     Pharynx: Oropharynx is clear. No oropharyngeal exudate or posterior oropharyngeal erythema.  Eyes:     Conjunctiva/sclera: Conjunctivae normal.  Cardiovascular:     Rate and Rhythm: Normal rate and regular rhythm.     Pulses: Normal pulses.  Pulmonary:     Effort: Pulmonary effort is normal.     Breath sounds: Normal breath  sounds.  Abdominal:     Palpations: Abdomen is soft.     Tenderness: There is no abdominal tenderness.  Musculoskeletal:        General: Normal range of motion.     Cervical back: Normal range of motion. No rigidity.  Skin:    General: Skin is warm.     Capillary Refill: Capillary refill takes less than 2 seconds.  Neurological:     General: No focal deficit present.     Mental Status: She is alert.     Sensory: No sensory deficit.     Motor: No weakness.     ED Results / Procedures / Treatments   Labs (all labs ordered are listed, but only abnormal results are displayed) Labs Reviewed  RESP PANEL BY RT-PCR (RSV, FLU A&B, COVID)  RVPGX2 - Abnormal; Notable for the following components:      Result Value   Influenza A by PCR POSITIVE (*)    All other components within normal limits  BASIC METABOLIC PANEL - Abnormal; Notable for the following components:   Glucose, Bld 100 (*)    Creatinine, Ser 1.03 (*)    All other components within normal limits  D-DIMER, QUANTITATIVE - Abnormal; Notable for the following components:   D-Dimer, Quant 0.93 (*)    All other components within normal limits  CBC WITH DIFFERENTIAL/PLATELET  TROPONIN I (HIGH SENSITIVITY)  TROPONIN I (HIGH SENSITIVITY)    EKG EKG Interpretation Date/Time:  Saturday May 30 2023 15:47:47 EST Ventricular Rate:  99 PR Interval:  150 QRS Duration:  74 QT Interval:  336 QTC Calculation: 431 R Axis:   -38  Text Interpretation: Normal sinus rhythm Left axis deviation Possible Anterolateral infarct , age undetermined  rate is faster compared to Feb 2024 Confirmed by Pricilla Loveless [phone]) on 05/30/2023 5:59:09 PM  Radiology CT Angio Chest PE W and/or Wo Contrast Result Date: 05/30/2023 CLINICAL DATA:  Cough, shortness of breath and congestion for the past 2 days. Elevated D-dimer. Clinical concern for possible pulmonary embolism EXAM: CT ANGIOGRAPHY CHEST WITH CONTRAST TECHNIQUE: Multidetector CT imaging of  the chest was performed using the standard protocol during bolus administration of intravenous contrast. Multiplanar CT image reconstructions and MIPs were obtained to evaluate the vascular anatomy. RADIATION DOSE REDUCTION: This exam was performed according to the departmental dose-optimization program which includes automated exposure control, adjustment of the mA and/or kV according to patient size and/or use of iterative reconstruction technique. CONTRAST:  75mL OMNIPAQUE IOHEXOL 350 MG/ML SOLN COMPARISON:  06/21/2022 FINDINGS: Cardiovascular: The pulmonary arteries are suboptimally opacified due to late contrast timing. No visible pulmonary emboli normal-sized heart. No pericardial effusion. Mediastinum/Nodes: No enlarged mediastinal, hilar, or axillary lymph nodes. Thyroid gland, trachea, and esophagus demonstrate no significant findings. Lungs/Pleura: Lungs are clear. No pleural effusion or pneumothorax. Upper Abdomen: Diffuse low density of  the liver. Cholecystectomy clips. Poorly distended proximal descending colon with possible small diverticula. No evidence of diverticulitis. Musculoskeletal: Mild-to-moderate thoracolumbar scoliosis. Mild thoracic spine degenerative changes Review of the MIP images confirms the above findings. IMPRESSION: 1. Suboptimal opacification of the pulmonary arteries due to late contrast timing with no visible pulmonary emboli. 2. No acute abnormality. 3. Hepatic steatosis. Electronically Signed   By: Beckie Salts M.D.   On: 05/30/2023 20:21   DG Chest 2 View Result Date: 05/30/2023 CLINICAL DATA:  Shortness of breath. EXAM: CHEST - 2 VIEW COMPARISON:  06/21/2022. FINDINGS: Low lung volume. Bilateral lung fields are clear. Bilateral costophrenic angles are clear. Normal cardio-mediastinal silhouette. No acute osseous abnormalities. The soft tissues are within normal limits. IMPRESSION: No active cardiopulmonary disease. Electronically Signed   By: Jules Schick M.D.   On:  05/30/2023 16:53    Procedures Procedures    Medications Ordered in ED Medications  albuterol (VENTOLIN HFA) 108 (90 Base) MCG/ACT inhaler 2 puff (has no administration in time range)    ED Course/ Medical Decision Making/ A&P                                 Medical Decision Making Patient here with complaints of chest tightness, cough, shortness of breath, body aches and sinus pressure for 2 days.  No known sick contacts.  Endorses chills without known fever.  Chest tightness is associated with cough.  Used her albuterol inhaler yesterday with some relief.  Shortness of breath associated with exertion.  Differential would include but not limited to pneumonia, viral process, asthma exacerbation, ACS PE also considered but symptoms felt to be more related to viral process  Amount and/or Complexity of Data Reviewed Labs: ordered.    Details: Labs without evidence of leukocytosis, chemistries without derangement.  Respiratory panel positive for influenza A.  Troponin flat delta unchanged.  D-dimer elevated 0.93 this level is above age-adjusted D-dimer cutoff Radiology: ordered.    Details: Chest x-ray without acute cardiopulmonary process  CT angio of the chest was ordered for further evaluation for possible PE.  Report read as suboptimal opacification of the pulmonary arteries due to late contrast timing but no visible pulmonary embolism ECG/medicine tests: ordered.    Details: EKG shows normal sinus rhythm, left axis deviation possible anterior lateral infarct age undetermined Discussion of management or test interpretation with external provider(s): On recheck, patient appears to be resting comfortably.  Vital signs are reassuring.  No tachycardia tachypnea or hypoxia.  Albuterol MDI dispensed for home use 2 puffs given here.    Discussed CTA results with patient and importance of prompt ER return if her dyspnea, chest pain worsen   Patient requesting Tamiflu, stating that she has  taken it in the past and it helped her symptoms.   will provide prescription along with Tessalon for her cough.  She will take Tylenol as needed for body aches or fever.  Appears appropriate for discharge home.  Discussed prompt ER return if her dyspnea or chest pain worsens  Risk OTC drugs. Prescription drug management.           Final Clinical Impression(s) / ED Diagnoses Final diagnoses:  Influenza    Rx / DC Orders ED Discharge Orders     None         Rosey Bath 05/30/23 2224    Pricilla Loveless, MD 05/30/23 [phone]

## 2023-05-30 NOTE — Discharge Instructions (Signed)
Your flu test this evening was positive.  Your symptoms can linger for several days.  I recommend rest, plenty of fluids.  Start the Tamiflu tomorrow.  You may take Tylenol if needed for body aches and/or fever.  You have been prescribed medication for your cough.  Use the albuterol inhaler 1 to 2 puffs every 4-6 hours as needed for shortness of breath or wheezing.  Please follow-up with your primary care provider for recheck if needed.  Return to the emergency department for any new or worsening symptoms

## 2023-06-02 ENCOUNTER — Ambulatory Visit (INDEPENDENT_AMBULATORY_CARE_PROVIDER_SITE_OTHER): Payer: BC Managed Care – PPO | Admitting: Psychiatry

## 2023-06-02 DIAGNOSIS — F411 Generalized anxiety disorder: Secondary | ICD-10-CM

## 2023-06-02 NOTE — Progress Notes (Signed)
Virtual Visit via Telephone Note  I connected with Virginia Trujillo on 06/02/23 at 8:08 AM EST by telephone and verified that I am speaking with the correct person using two identifiers.  Location: Patient: Home Provider: [facility] [place] office   I discussed the limitations, risks, security and privacy concerns of performing an evaluation and management service by telephone and the availability of in person appointments. I also discussed with the patient that there may be a patient responsible charge related to this service. The patient expressed understanding and agreed to proceed.    I provided 19 minutes of non-face-to-face time during this encounter.   Adah Salvage, LCSW                              THERAPIST PROGRESS NOTE     Session Time: Tuesday  06/02/2023 8:08 AM - 8:27 AM   Participation Level: Active  Behavioral Response: talkative  Type of Therapy: Individual Therapy  Treatment Goals addressed: Srishti will score less than 5 on the Generalized Anxiety Disorder 7 Scale (GAD-7)  Pt will practice relaxation techniques daily    Progress on Goals:  progressing   Interventions: CBT and Supportive  Summary: Virginia Trujillo is a 58 y.o. female who presents with symptoms of anxiety that have been lilfelong per patient's report but symptoms worsened when her mother died 15+ years ago. Also during the same 2 month period around her mother's death,her grandparents and her sister died. .Patient reports seeing a psyhiatrist once in early adolescence due to isolative behaviors and anxiety. She is seeing psychiatrist Dr. Tenny Craw. Patient reports no psychiatric hospitalizations. She has been seen intermittently by this clinician for several years.  Patient's reports still experiencing depression and anxiety is constantly worrying.  She is particularly stressed now about her youngest son who has legal issues.  She fears he may go to jail.  She also recently learned he is going to  have another baby.  She also has what if thoughts about various other issues especially about her father dying.  Patient last was seen about 5-6 weeks ago.  Patient is relieved her son's court case has been continued until February 2025.  However she reports increased stress, anxiety, and depressed mood since last session.  She reports trying to cope with anxiety and worry by using distracting activities.  Triggers of stress include her 54 year old father's health issues which have worsened.   He has prostate cancer and congestive heart failure.  Per her report, he was in the hospital during Christmas.  Patient also reports experiencing increased health issues.  She went to the ED this past weekend and was diagnosed with the flu.  She still is not feeling well.  She therapist and patient agreed to end session early.      Suicidal/Homicidal: None        Therapist Response: reviewed symptoms, discussed stressors, facilitated expression of thoughts and feelings, validated feelings, praised and reinforced patient's efforts to use distracting activities, encouraged patient to focus on self-care and rest, agreed to end session early as patient is not feeling well   Plan: Return again in 2 weeks  Diagnosis: Axis I: Generalized Anxiety Disorder      Axis II: No diagnosis Collaboration of Care: Psychiatrist AEB patient sees psychiatrist Dr. Tenny Craw in this practice for medication management  Patient/Guardian was advised Release of Information must be obtained prior to any record release in order to  collaborate their care with an outside provider. Patient/Guardian was advised if they have not already done so to contact the registration department to sign all necessary forms in order for [place] to release information regarding their care.   Consent: Patient/Guardian gives verbal consent for treatment and assignment of benefits for services provided during this visit. Patient/Guardian expressed understanding and agreed  to proceed.    Adah Salvage, LCSW 06/02/2023

## 2023-06-05 DIAGNOSIS — G4733 Obstructive sleep apnea (adult) (pediatric): Secondary | ICD-10-CM | POA: Diagnosis not present

## 2023-06-06 ENCOUNTER — Other Ambulatory Visit (HOSPITAL_COMMUNITY): Payer: Self-pay | Admitting: Psychiatry

## 2023-06-08 ENCOUNTER — Encounter (HOSPITAL_COMMUNITY): Payer: Self-pay

## 2023-06-08 ENCOUNTER — Emergency Department (HOSPITAL_COMMUNITY)
Admission: EM | Admit: 2023-06-08 | Discharge: 2023-06-08 | Disposition: A | Discharge status: Home / Self Care | Payer: BC Managed Care – PPO | Attending: Emergency Medicine | Admitting: Emergency Medicine

## 2023-06-08 ENCOUNTER — Emergency Department (HOSPITAL_COMMUNITY): Payer: BC Managed Care – PPO

## 2023-06-08 ENCOUNTER — Other Ambulatory Visit: Payer: Self-pay

## 2023-06-08 DIAGNOSIS — R079 Chest pain, unspecified: Secondary | ICD-10-CM | POA: Insufficient documentation

## 2023-06-08 DIAGNOSIS — J45909 Unspecified asthma, uncomplicated: Secondary | ICD-10-CM | POA: Diagnosis not present

## 2023-06-08 DIAGNOSIS — R0602 Shortness of breath: Secondary | ICD-10-CM | POA: Insufficient documentation

## 2023-06-08 DIAGNOSIS — R42 Dizziness and giddiness: Secondary | ICD-10-CM | POA: Diagnosis not present

## 2023-06-08 DIAGNOSIS — R042 Hemoptysis: Secondary | ICD-10-CM | POA: Insufficient documentation

## 2023-06-08 LAB — CBC WITH DIFFERENTIAL/PLATELET
Abs Immature Granulocytes: 0.02 10*3/uL (ref 0.00–0.07)
Basophils Absolute: 0 10*3/uL (ref 0.0–0.1)
Basophils Relative: 0 %
Eosinophils Absolute: 0 10*3/uL (ref 0.0–0.5)
Eosinophils Relative: 1 %
HCT: 37.9 % (ref 36.0–46.0)
Hemoglobin: 12.3 g/dL (ref 12.0–15.0)
Immature Granulocytes: 0 %
Lymphocytes Relative: 30 %
Lymphs Abs: 2.1 10*3/uL (ref 0.7–4.0)
MCH: 31.8 pg (ref 26.0–34.0)
MCHC: 32.5 g/dL (ref 30.0–36.0)
MCV: 97.9 fL (ref 80.0–100.0)
Monocytes Absolute: 0.4 10*3/uL (ref 0.1–1.0)
Monocytes Relative: 5 %
Neutro Abs: 4.5 10*3/uL (ref 1.7–7.7)
Neutrophils Relative %: 64 %
Platelets: 200 10*3/uL (ref 150–400)
RBC: 3.87 MIL/uL (ref 3.87–5.11)
RDW: 12.6 % (ref 11.5–15.5)
WBC: 7 10*3/uL (ref 4.0–10.5)
nRBC: 0 % (ref 0.0–0.2)

## 2023-06-08 LAB — BASIC METABOLIC PANEL
Anion gap: 10 (ref 5–15)
BUN: 11 mg/dL (ref 6–20)
CO2: 27 mmol/L (ref 22–32)
Calcium: 9.2 mg/dL (ref 8.9–10.3)
Chloride: 105 mmol/L (ref 98–111)
Creatinine, Ser: 1.13 mg/dL — ABNORMAL HIGH (ref 0.44–1.00)
GFR, Estimated: 57 mL/min — ABNORMAL LOW (ref >60)
Glucose, Bld: 87 mg/dL (ref 70–99)
Potassium: 4.2 mmol/L (ref 3.5–5.1)
Sodium: 142 mmol/L (ref 135–145)

## 2023-06-08 LAB — D-DIMER, QUANTITATIVE: D-Dimer, Quant: 0.83 ug{FEU}/mL — ABNORMAL HIGH (ref 0.00–0.50)

## 2023-06-08 LAB — TROPONIN I (HIGH SENSITIVITY): Troponin I (High Sensitivity): <2 ng/L (ref <18)

## 2023-06-08 LAB — BRAIN NATRIURETIC PEPTIDE: B Natriuretic Peptide: 49 pg/mL (ref 0.0–100.0)

## 2023-06-08 MED ORDER — ALBUTEROL SULFATE HFA 108 (90 BASE) MCG/ACT IN AERS: 2.0000 | INHALATION_SPRAY | RESPIRATORY_TRACT | Status: DC | PRN

## 2023-06-08 MED ORDER — ACETAMINOPHEN 500 MG PO TABS
1000.0000 mg | ORAL_TABLET | Freq: Once | ORAL | Status: AC
  Administered 2023-06-08: 1000 mg via ORAL
  Filled 2023-06-08: qty 2

## 2023-06-08 MED ORDER — IPRATROPIUM-ALBUTEROL 0.5-2.5 (3) MG/3ML IN SOLN
3.0000 mL | Freq: Once | RESPIRATORY_TRACT | Status: AC
  Administered 2023-06-08: 3 mL via RESPIRATORY_TRACT
  Filled 2023-06-08: qty 3

## 2023-06-08 NOTE — ED Triage Notes (Signed)
Pt arrived via POV c/o SOB, dizziness and bloody sputum when she coughs.

## 2023-06-08 NOTE — ED Provider Notes (Cosign Needed)
[place] EMERGENCY DEPARTMENT AT [facility] Provider Note   CSN: [id number] Arrival date & time: 06/08/23  1212     History  Chief Complaint  Patient presents with   Hemoptysis   HPI Virginia Trujillo is a 58 y.o. female with history of asthma, fibromyalgia presenting for hemoptysis. States she was diagnosed with the flu last week.  Also endorsing some shortness of breath and intermittent chest pain. Chest pain is in the center of her chest and at times radiates to her back it does hurt sometimes with deep breaths.  States she has had a persistent cough for the past week but this morning noticed she had a few drops of blood in the cough.  HPI     Home Medications Prior to Admission medications   Medication Sig Start Date End Date Taking? Authorizing Provider  albuterol (PROVENTIL) (2.5 MG/3ML) 0.083% nebulizer solution Take 2.5 mg by nebulization every 6 (six) hours as needed for wheezing or shortness of breath.    [provider]  albuterol (VENTOLIN HFA) 108 (90 Base) MCG/ACT inhaler Inhale 2 puffs into the lungs every 4 (four) hours as needed for wheezing or shortness of breath. 06/06/21   [provider]  ALPRAZolam Prudy Feeler) 1 MG tablet Take 1 tablet (1 mg total) by mouth 3 (three) times daily. 04/14/23   Myrlene Broker, MD  ARIPiprazole (ABILIFY) 2 MG tablet Take 1 tablet (2 mg total) by mouth every morning. 04/14/23   Myrlene Broker, MD  baclofen (LIORESAL) 10 MG tablet Take 10 mg by mouth 3 (three) times daily.    [provider]  benzonatate (TESSALON) 200 MG capsule Take 1 capsule (200 mg total) by mouth 3 (three) times daily as needed. 05/30/23   Triplett, Tammy, PA-C  DULoxetine (CYMBALTA) 60 MG capsule Take 1 capsule (60 mg total) by mouth 2 (two) times daily. 04/14/23   Myrlene Broker, MD  montelukast (SINGULAIR) 10 MG tablet Take 10 mg by mouth daily. 08/02/21   [provider]  oseltamivir (TAMIFLU) 75 MG capsule Take 1 capsule  (75 mg total) by mouth every 12 (twelve) hours. 05/30/23   Triplett, Tammy, PA-C  pantoprazole (PROTONIX) 40 MG tablet Take 40 mg by mouth daily. 08/02/21   [provider]  traMADol-acetaminophen (ULTRACET) 37.5-325 MG tablet Take 2 tablets by mouth every 6 (six) hours as needed for moderate pain or severe pain. 12/04/21   [provider]  zolpidem (AMBIEN) 10 MG tablet Take 1 tablet (10 mg total) by mouth at bedtime as needed for sleep. 04/14/23 07/13/23  Myrlene Broker, MD      Allergies    Prednisone and Penicillins    Review of Systems   See HPI for pertinent positives  Physical Exam Updated Vital Signs BP 134/71   Pulse 65   Temp 98.1 F (36.7 C) (Oral)   Resp 18   Ht 5\' 3"  (1.6 m)   Wt 120 kg   SpO2 98%   BMI 46.86 kg/m  Physical Exam Vitals and nursing note reviewed.  HENT:     Head: Normocephalic and atraumatic.     Mouth/Throat:     Mouth: Mucous membranes are moist.     Pharynx: Oropharynx is clear. Uvula midline.     Comments: No bleeding noted in the posterior oropharynx Eyes:     General:        Right eye: No discharge.        Left eye: No discharge.  Conjunctiva/sclera: Conjunctivae normal.  Cardiovascular:     Rate and Rhythm: Normal rate and regular rhythm.     Pulses: Normal pulses.     Heart sounds: Normal heart sounds.  Pulmonary:     Effort: Pulmonary effort is normal.     Breath sounds: Normal breath sounds.  Abdominal:     General: Abdomen is flat.     Palpations: Abdomen is soft.  Skin:    General: Skin is warm and dry.  Neurological:     General: No focal deficit present.  Psychiatric:        Mood and Affect: Mood normal.     ED Results / Procedures / Treatments   Labs (all labs ordered are listed, but only abnormal results are displayed) Labs Reviewed  BASIC METABOLIC PANEL - Abnormal; Notable for the following components:      Result Value   Creatinine, Ser 1.13 (*)    GFR, Estimated 57 (*)    All other  components within normal limits  D-DIMER, QUANTITATIVE - Abnormal; Notable for the following components:   D-Dimer, Quant 0.83 (*)    All other components within normal limits  CBC WITH DIFFERENTIAL/PLATELET  BRAIN NATRIURETIC PEPTIDE  TROPONIN I (HIGH SENSITIVITY)    EKG None  Radiology DG Chest 2 View Result Date: 06/08/2023 CLINICAL DATA:  Shortness of breath, dizziness and bloody sputum. EXAM: CHEST - 2 VIEW COMPARISON:  Radiographs and CT 05/30/2023. Additional radiographs 06/21/2022. FINDINGS: The heart size and mediastinal contours are stable. Lungs appear clear. No evidence of pleural effusion or pneumothorax. Moderate convex right thoracolumbar scoliosis without evidence of acute osseous abnormality. Cholecystectomy clips noted. IMPRESSION: No evidence of acute cardiopulmonary process. Scoliosis. Electronically Signed   By: Carey Bullocks M.D.   On: 06/08/2023 14:53    Procedures Procedures    Medications Ordered in ED Medications  albuterol (VENTOLIN HFA) 108 (90 Base) MCG/ACT inhaler 2 puff (has no administration in time range)  ipratropium-albuterol (DUONEB) 0.5-2.5 (3) MG/3ML nebulizer solution 3 mL (3 mLs Nebulization Given 06/08/23 1938)  acetaminophen (TYLENOL) tablet 1,000 mg (1,000 mg Oral Given 06/08/23 1913)    ED Course/ Medical Decision Making/ A&P Clinical Course as of 06/08/23 2025  Mon Jun 08, 2023  1941 Creatinine(!): 1.13 [JR]  1958 BMI (Calculated): 46.88 [JR]    Clinical Course User Index [JR] Gareth Eagle, PA-C                                 Medical Decision Making Amount and/or Complexity of Data Reviewed Labs: ordered. Decision-making details documented in ED Course. Radiology: ordered.  Risk OTC drugs. Prescription drug management.   Initial Impression and Ddx 58 year old well-appearing female presenting for hemoptysis and intermittent chest pain and shortness of breath.  Exam was unremarkable.  DDx includes PE, pneumonia, ACS, CHF  exacerbation, COPD/asthma exacerbation, other. Patient PMH that increases complexity of ED encounter:  asthma, fibromyalgia, recent flu  Interpretation of Diagnostics - I independent reviewed and interpreted the labs as followed: elevated d dimer (0.83 but down from last check last week at 0.93), mildly reduced GFR  - I independently visualized the following imaging with scope of interpretation limited to determining acute life threatening conditions related to emergency care: CXR, which revealed no acute findings  -I personally reviewed and interpreted EKG which revealed sinus bradycardia  Patient Reassessment and Ultimate Disposition/Management On reassessment, patient states she felt better after treatment.  No  witnessed hemoptysis during this encounter.  D-dimer was elevated but down from last check a week ago.  Also reviewed her CTA chest from last week which was negative for PE.  Given that she is not tachycardic, no signs of DVT on exam, reassuring chest pain workup with a negative CTA of the chest last week, feel that chances of her having a PE are unlikely at this time.  Workup does not suggest ACS.  Suspect her hemoptysis could be irritation in the upper airway given persistent coughing in the setting of flu.  Advised continue taking the [employer] as prescribed and follow-up with her PCP.  Discussed pertinent return precautions.  Vital stable.  Discharged good condition.  Patient management required discussion with the following services or consulting groups:  None  Complexity of Problems Addressed Acute complicated illness or Injury  Additional Data Reviewed and Analyzed Further history obtained from: Further history from spouse/family member, Past medical history and medications listed in the EMR, and Prior ED visit notes  Patient Encounter Risk Assessment None         Final Clinical Impression(s) / ED Diagnoses Final diagnoses:  Hemoptysis    Rx / DC Orders ED  Discharge Orders     None         Gareth Eagle, PA-C 06/08/23 2030

## 2023-06-08 NOTE — Discharge Instructions (Signed)
Evaluation today was overall reassuring.  Your labs show some improvement in comparison to last week.

## 2023-06-09 ENCOUNTER — Other Ambulatory Visit (HOSPITAL_COMMUNITY): Payer: Self-pay | Admitting: Psychiatry

## 2023-06-19 ENCOUNTER — Ambulatory Visit (INDEPENDENT_AMBULATORY_CARE_PROVIDER_SITE_OTHER): Payer: BC Managed Care – PPO | Admitting: Psychiatry

## 2023-06-19 DIAGNOSIS — F411 Generalized anxiety disorder: Secondary | ICD-10-CM

## 2023-06-19 NOTE — Progress Notes (Signed)
Virtual Visit via Telephone Note  I connected with Virginia Trujillo on 06/19/23 at 9:10 AM EST by telephone and verified that I am speaking with the correct person using two identifiers.  Location: Patient: Home Provider: [facility] Outpatient [place] office   I discussed the limitations, risks, security and privacy concerns of performing an evaluation and management service by telephone and the availability of in person appointments. I also discussed with the patient that there may be a patient responsible charge related to this service. The patient expressed understanding and agreed to proceed.    I provided 52 minutes of non-face-to-face time during this encounter.   Adah Salvage, LCSW                              THERAPIST PROGRESS NOTE     Session Time: Friday  06/19/2023 9:10 AM - 10:02 AM   Participation Level: Active  Behavioral Response: talkative  Type of Therapy: Individual Therapy  Treatment Goals addressed: Tecia will score less than 5 on the Generalized Anxiety Disorder 7 Scale (GAD-7)  Pt will practice relaxation techniques daily    Progress on Goals:  progressing   Interventions: CBT and Supportive  Summary: Virginia Trujillo is a 58 y.o. female who presents with symptoms of anxiety that have been lilfelong per patient's report but symptoms worsened when her mother died 15+ years ago. Also during the same 2 month period around her mother's death,her grandparents and her sister died. .Patient reports seeing a psyhiatrist once in early adolescence due to isolative behaviors and anxiety. She is seeing psychiatrist Dr. Tenny Craw. Patient reports no psychiatric hospitalizations. She has been seen intermittently by this clinician for several years.  Patient's reports still experiencing depression and anxiety is constantly worrying.  She is particularly stressed now about her youngest son who has legal issues.  She fears he may go to jail.  She also recently learned he is going  to have another baby.  She also has what if thoughts about various other issues especially about her father dying.     Patient last was seen about 2 weeks ago.  Patient reports increased stress and anxiety since last session.  She reports multiple stressors including her son being sentenced to prison for 2 years.  She anticipated he would be imprisoned but did not anticipate the sentence being this long.  She plans to work with him and his girlfriend to appeal the case.  She also reports stress regarding her son's girlfriend who is pregnant recently informing her there may be some genetic issues related to the fetus.  Patient plans to go with her the girlfriend to the doctor next week.  She reports increased stress regarding keeping her other 3 grandchildren as their mother also is having health issues.  She reports ongoing stress regarding her father who has health issues.  Per patient's report she experienced a lot of the stressors yesterday and became very overwhelmed.  She reports a Xanax.  She also reports using support from her husband.     Suicidal/Homicidal: None        Therapist Response: reviewed symptoms, discussed stressors, facilitated expression of thoughts and feelings, validated feelings, reviewed ways to cope with anxiety and worry through the use of relaxation techniques as well as distracting activities, assisted patient identify realistic expectations of self, discussed ways to set and maintain limits, encouraged patient to resume use of daily planning and to  practice relaxation techniques daily   Plan: Return again in 2 weeks  Diagnosis: Axis I: Generalized Anxiety Disorder      Axis II: No diagnosis Collaboration of Care: Psychiatrist AEB patient sees psychiatrist Dr. Tenny Craw in this practice for medication management  Patient/Guardian was advised Release of Information must be obtained prior to any record release in order to collaborate their care with an outside provider.  Patient/Guardian was advised if they have not already done so to contact the registration department to sign all necessary forms in order for [place] to release information regarding their care.   Consent: Patient/Guardian gives verbal consent for treatment and assignment of benefits for services provided during this visit. Patient/Guardian expressed understanding and agreed to proceed.    Adah Salvage, LCSW 06/19/2023

## 2023-07-03 DIAGNOSIS — G4733 Obstructive sleep apnea (adult) (pediatric): Secondary | ICD-10-CM | POA: Diagnosis not present

## 2023-07-13 ENCOUNTER — Telehealth (HOSPITAL_COMMUNITY): Payer: BC Managed Care – PPO | Admitting: Psychiatry

## 2023-07-16 ENCOUNTER — Telehealth (INDEPENDENT_AMBULATORY_CARE_PROVIDER_SITE_OTHER): Payer: BC Managed Care – PPO | Admitting: Psychiatry

## 2023-07-16 ENCOUNTER — Encounter (HOSPITAL_COMMUNITY): Payer: Self-pay | Admitting: Psychiatry

## 2023-07-16 DIAGNOSIS — F321 Major depressive disorder, single episode, moderate: Secondary | ICD-10-CM | POA: Diagnosis not present

## 2023-07-16 DIAGNOSIS — F411 Generalized anxiety disorder: Secondary | ICD-10-CM | POA: Diagnosis not present

## 2023-07-16 MED ORDER — DULOXETINE HCL 60 MG PO CPEP: 60.0000 mg | ORAL_CAPSULE | Freq: Two times a day (BID) | ORAL | 2 refills | Status: DC

## 2023-07-16 MED ORDER — ARIPIPRAZOLE 2 MG PO TABS: 2.0000 mg | ORAL_TABLET | ORAL | 2 refills | Status: DC

## 2023-07-16 MED ORDER — ALPRAZOLAM 1 MG PO TABS: 1.0000 mg | ORAL_TABLET | Freq: Three times a day (TID) | ORAL | 2 refills | Status: DC

## 2023-07-16 MED ORDER — ZOLPIDEM TARTRATE 10 MG PO TABS: 10.0000 mg | ORAL_TABLET | Freq: Every evening | ORAL | 2 refills | Status: DC | PRN

## 2023-07-16 NOTE — Progress Notes (Signed)
 Virtual Visit via Telephone Note  I connected with Virginia Trujillo on 07/16/23 at  9:40 AM EDT by telephone and verified that I am speaking with the correct person using two identifiers.  Location: Patient: home Provider: office   I discussed the limitations, risks, security and privacy concerns of performing an evaluation and management service by telephone and the availability of in person appointments. I also discussed with the patient that there may be a patient responsible charge related to this service. The patient expressed understanding and agreed to proceed.       I discussed the assessment and treatment plan with the patient. The patient was provided an opportunity to ask questions and all were answered. The patient agreed with the plan and demonstrated an understanding of the instructions.   The patient was advised to call back or seek an in-person evaluation if the symptoms worsen or if the condition fails to improve as anticipated.  I provided 20 minutes of non-face-to-face time during this encounter.   Virginia Ruder, MD  [facility] MD/PA/NP OP Progress Note  07/16/2023 10:06 AM Virginia Trujillo  MRN:  [id number]  Chief Complaint:  Chief Complaint  Patient presents with   Anxiety   Depression   Follow-up   HPI: This patient is a 58 year old married black female who lives with her husband in [place].  She is unemployed.  The patient returns for follow-up after 3 months regarding her depression and anxiety.  Right now she is not feeling well and has bronchitis and is losing her voice.  She just got over the flu recently as well.  She is going to try to get in with her primary doctor.  She has been stressed about a lot of things.  Her son was sentenced to 1 year in jail for some old charges and her dad is very ill.  Nevertheless she feels her medications continue to help with her depression anxiety and insomnia.  She denies any thoughts of self-harm or suicide. Visit Diagnosis:     ICD-10-CM   1. Generalized anxiety disorder  F41.1     2. 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 episode (HCC)  F32.1       Past Psychiatric History: none  Past Medical History:  Past Medical History:  Diagnosis Date   Acid reflux    Anxiety    Asthma    Depression    Fibromyalgia    Sleep apnea     Past Surgical History:  Procedure Laterality Date   CESAREAN SECTION     CHOLECYSTECTOMY N/A 08/14/2021   Procedure: LAPAROSCOPIC CHOLECYSTECTOMY;  Surgeon: Lewie Chamber, DO;  Location: [facility];  Service: General;  Laterality: N/A;   MOUTH SURGERY     TUBAL LIGATION     tubes tied      Family Psychiatric History: See below  Family History:  Family History  Problem Relation Age of Onset   Hypertension Father    Hypertension Brother    Diabetes Brother    Depression Brother    Anxiety disorder Brother    Stroke Maternal Grandmother    Stroke Paternal Grandfather    Congestive Heart Failure Sister    Diabetes Sister    Hypertension Sister    Anxiety disorder Sister    Depression Sister    Heart disease Unknown    Arthritis Unknown    Cancer Unknown    Asthma Unknown    Diabetes Unknown    Kidney disease Unknown  Social History:  Social History   Socioeconomic History   Marital status: Married    Spouse name: Not on file   Number of children: 2   Years of education: 12   Highest education level: Not on file  Occupational History   Occupation: none    Employer: UNEMPLOYED   Occupation: Unemployed   Tobacco Use   Smoking status: Never    Passive exposure: Never   Smokeless tobacco: Never  Vaping Use   Vaping status: Never Used  Substance and Sexual Activity   Alcohol use: No   Drug use: No   Sexual activity: Yes    Birth control/protection: Surgical  Other Topics Concern   Not on file  Social History Narrative   Patient lives at home with husband and son.    Patient does not work   Patient has a high  school education    Patient has 2 children.   Social Drivers of Corporate investment banker Strain: Not on file  Food Insecurity: Not on file  Transportation Needs: Not on file  Physical Activity: Not on file  Stress: Not on file  Social Connections: Not on file    Allergies:  Allergies  Allergen Reactions   Prednisone Other (See Comments)    Depression and crying   Penicillins     Metabolic Disorder Labs: No results found for: "HGBA1C", "MPG" No results found for: "PROLACTIN" No results found for: "CHOL", "TRIG", "HDL", "CHOLHDL", "VLDL", "LDLCALC" Lab Results  Component Value Date   TSH 3.190 11/29/2013    Therapeutic Level Labs: No results found for: "LITHIUM" No results found for: "VALPROATE" No results found for: "CBMZ"  Current Medications: Current Outpatient Medications  Medication Sig Dispense Refill   albuterol (PROVENTIL) (2.5 MG/3ML) 0.083% nebulizer solution Take 2.5 mg by nebulization every 6 (six) hours as needed for wheezing or shortness of breath.     albuterol (VENTOLIN HFA) 108 (90 Base) MCG/ACT inhaler Inhale 2 puffs into the lungs every 4 (four) hours as needed for wheezing or shortness of breath.     ALPRAZolam (XANAX) 1 MG tablet Take 1 tablet (1 mg total) by mouth 3 (three) times daily. 90 tablet 2   ARIPiprazole (ABILIFY) 2 MG tablet Take 1 tablet (2 mg total) by mouth every morning. 30 tablet 2   baclofen (LIORESAL) 10 MG tablet Take 10 mg by mouth 3 (three) times daily.     benzonatate (TESSALON) 200 MG capsule Take 1 capsule (200 mg total) by mouth 3 (three) times daily as needed. 21 capsule 0   DULoxetine (CYMBALTA) 60 MG capsule Take 1 capsule (60 mg total) by mouth 2 (two) times daily. 60 capsule 2   montelukast (SINGULAIR) 10 MG tablet Take 10 mg by mouth daily.     oseltamivir (TAMIFLU) 75 MG capsule Take 1 capsule (75 mg total) by mouth every 12 (twelve) hours. 10 capsule 0   pantoprazole (PROTONIX) 40 MG tablet Take 40 mg by mouth  daily.     traMADol-acetaminophen (ULTRACET) 37.5-325 MG tablet Take 2 tablets by mouth every 6 (six) hours as needed for moderate pain or severe pain.     zolpidem (AMBIEN) 10 MG tablet Take 1 tablet (10 mg total) by mouth at bedtime as needed for sleep. 30 tablet 2   No current facility-administered medications for this visit.     Musculoskeletal: Strength & Muscle Tone: na Gait & Station: na Patient leans: N/A  Psychiatric Specialty Exam: Review of Systems  HENT:  Positive for congestion and voice change.   Respiratory:  Positive for cough.   Musculoskeletal:  Positive for back pain.  All other systems reviewed and are negative.   There were no vitals taken for this visit.There is no height or weight on file to calculate BMI.  General Appearance: NA  Eye Contact:  NA  Speech:  Clear and Coherent  Volume:  Normal  Mood:  Euthymic  Affect:  NA  Thought Process:  Goal Directed  Orientation:  Full (Time, Place, and Person)  Thought Content: Rumination   Suicidal Thoughts:  No  Homicidal Thoughts:  No  Memory:  Immediate;   Good Recent;   Good Remote;   Fair  Judgement:  Good  Insight:  Fair  Psychomotor Activity:  Decreased  Concentration:  Concentration: Good and Attention Span: Good  Recall:  Good  Fund of Knowledge: Fair  Language: Good  Akathisia:  No  Handed:  Right  AIMS (if indicated): not done  Assets:  Communication Skills Desire for Improvement Resilience Social Support  ADL's:  Intact  Cognition: WNL  Sleep:  Good   Screenings: GAD-7    Advertising copywriter from 04/20/2023 in [facility] [place] Counselor from 04/06/2023 in [facility] at [place] Counselor from 03/20/2023 in [facility] at [place] Counselor from 12/16/2022 in [facility] at [place] Counselor from 11/10/2022 in [facility] at  [place]  Total GAD-7 Score 17 21 21 21 21       PHQ2-9    Flowsheet Row Counselor from 04/06/2023 in [facility] at [place] Counselor from 08/08/2022 in [facility] at [place] Counselor from 07/01/2022 in [facility] Outpatient Behavioral Health at [place] Video Visit from 12/16/2021 in [facility] at [place] Video Visit from 10/02/2021 in [facility] at [facility] Total Score 5 4 5 1 1   PHQ-9 Total Score 17 14 23  -- --      Flowsheet Row ED from 06/08/2023 in [facility] Emergency Department at [facility] ED from 05/30/2023 in [facility] Emergency Department at [facility] Counselor from 04/06/2023 in [facility] Outpatient Behavioral Health at [place]  C-SSRS RISK CATEGORY No Risk No Risk Moderate Risk        Assessment and Plan: This patient is a 58 year old female with a history of depression and anxiety.  She seems still to be benefiting from the medications as well as therapy.  She will continue Cymbalta 60 mg twice daily for depression, Abilify 2 mg daily for augmentation, Xanax 1 mg up to 3 times daily as needed for anxiety and Ambien 10 mg as needed for sleep at bedtime.  She will return to see me in 3 months  Collaboration of Care: Collaboration of Care: Referral or follow-up with counselor/therapist AEB patient will continue therapy with Florencia Reasons in our office  Patient/Guardian was advised Release of Information must be obtained prior to any record release in order to collaborate their care with an outside provider. Patient/Guardian was advised if they have not already done so to contact the registration department to sign all necessary forms in order for [place] to release information regarding their care.   Consent: Patient/Guardian gives verbal consent for treatment and assignment of benefits for services provided during this visit.  Patient/Guardian expressed understanding and agreed to proceed.    Virginia Ruder, MD 07/16/2023,  10:06 AM

## 2023-08-12 DIAGNOSIS — M797 Fibromyalgia: Secondary | ICD-10-CM | POA: Diagnosis not present

## 2023-08-12 DIAGNOSIS — G4733 Obstructive sleep apnea (adult) (pediatric): Secondary | ICD-10-CM | POA: Diagnosis not present

## 2023-08-12 DIAGNOSIS — Z6841 Body Mass Index (BMI) 40.0 and over, adult: Secondary | ICD-10-CM | POA: Diagnosis not present

## 2023-08-12 DIAGNOSIS — M170 Bilateral primary osteoarthritis of knee: Secondary | ICD-10-CM | POA: Diagnosis not present

## 2023-08-21 ENCOUNTER — Ambulatory Visit (INDEPENDENT_AMBULATORY_CARE_PROVIDER_SITE_OTHER): Payer: BC Managed Care – PPO | Admitting: Psychiatry

## 2023-08-21 DIAGNOSIS — F411 Generalized anxiety disorder: Secondary | ICD-10-CM | POA: Diagnosis not present

## 2023-08-21 NOTE — Progress Notes (Signed)
 Virtual Visit via Telephone Note  I connected with Virginia Trujillo on 08/21/23 at 9:08 AM EST by telephone and verified that I am speaking with the correct person using two identifiers.  Location: Patient: Home Provider: Home office   I discussed the limitations, risks, security and privacy concerns of performing an evaluation and management service by telephone and the availability of in person appointments. I also discussed with the patient that there may be a patient responsible charge related to this service. The patient expressed understanding and agreed to proceed.    I provided  50 minutes of non-face-to-face time during this encounter.   Dicie Foster, LCSW                              THERAPIST PROGRESS NOTE     Session Time: Friday  4/182025 9:08 AM - 9:58 AM   Participation Level: Active  Behavioral Response: talkative  Type of Therapy: Individual Therapy  Treatment Goals addressed: Therasa will score less than 5 on the Generalized Anxiety Disorder 7 Scale (GAD-7)  Pt will practice relaxation techniques daily    Progress on Goals:  progressing   Interventions: CBT and Supportive  Summary: Virginia Trujillo is a 58 y.o. female who presents with symptoms of anxiety that have been lilfelong per patient's report but symptoms worsened when her mother died 15+ years ago. Also during the same 2 month period around her mother's death,her grandparents and her sister died. .Patient reports seeing a psyhiatrist once in early adolescence due to isolative behaviors and anxiety. She is seeing psychiatrist Dr. Avanell Bob. Patient reports no psychiatric hospitalizations. She has been seen intermittently by this clinician for several years.  Patient's reports still experiencing depression and anxiety is constantly worrying.  She is particularly stressed now about her youngest son who has legal issues.  She fears he may go to jail.  She also recently learned he is going to have another baby.   She also has what if thoughts about various other issues especially about her father dying.     Patient last was seen about 2 weeks ago.  Patient reports increased symptoms of anxiety since last session as reflected in the GAD-7. She reports multiple stressors including her son being in prison. However, she expresses some relief as his sentence has been reduced to a year with the possibility of being further reduced since he now is working as a Barrister's clerk in prison. She continues to provide a lot of care to 3 children along with providing support to his girlfriend who is pregnant. She reports worry about 83 yo father who has been in and out of the hospital frequently since last session. Pt reports strong support from her family and friends She has been coping by using this support, deep breathing, taking breaks for self, using coping statements, and using her spirituality.  She enjoys her grandchildren and still enjoys doing crafts. She is looking forward to going on a cruise next month.    Suicidal/Homicidal: None        Therapist Response: reviewed symptoms, discussed stressors, facilitated expression of thoughts and feelings, validated feelings, praised and reinforced pt's use of healthy coping strategies, discussed ways to maintain consistency. Plan: Return again in 2 weeks  Diagnosis: Axis I: Generalized Anxiety Disorder      Axis II: No diagnosis Collaboration of Care: Psychiatrist AEB patient sees psychiatrist Dr. Avanell Bob in this practice for medication management  Patient/Guardian was advised Release of Information must be obtained prior to any record release in order to collaborate their care with an outside provider. Patient/Guardian was advised if they have not already done so to contact the registration department to sign all necessary forms in order for us  to release information regarding their care.   Consent: Patient/Guardian gives verbal consent for treatment and assignment of benefits for  services provided during this visit. Patient/Guardian expressed understanding and agreed to proceed.    Dicie Foster, LCSW 08/21/2023

## 2023-09-04 ENCOUNTER — Ambulatory Visit (INDEPENDENT_AMBULATORY_CARE_PROVIDER_SITE_OTHER): Payer: BC Managed Care – PPO | Admitting: Psychiatry

## 2023-09-04 DIAGNOSIS — F321 Major depressive disorder, single episode, moderate: Secondary | ICD-10-CM

## 2023-09-04 DIAGNOSIS — G4733 Obstructive sleep apnea (adult) (pediatric): Secondary | ICD-10-CM | POA: Diagnosis not present

## 2023-09-04 DIAGNOSIS — F411 Generalized anxiety disorder: Secondary | ICD-10-CM

## 2023-09-04 NOTE — Progress Notes (Signed)
 Virtual Visit via Telephone Note  I connected with Virginia Trujillo on 09/04/23 at 9:17 AM EST by telephone and verified that I am speaking with the correct person using two identifiers.  Location: Patient: Home Provider: Home office   I discussed the limitations, risks, security and privacy concerns of performing an evaluation and management service by telephone and the availability of in person appointments. I also discussed with the patient that there may be a patient responsible charge related to this service. The patient expressed understanding and agreed to proceed.    I provided  42 minutes of non-face-to-face time during this encounter.   Dicie Foster, LCSW                              THERAPIST PROGRESS NOTE     Session Time: Friday  09/04/2023 9:17 AM - 9:59 AM   Participation Level: Active  Behavioral Response: talkative  Type of Therapy: Individual Therapy  Treatment Goals addressed: Esa will score less than 5 on the Generalized Anxiety Disorder 7 Scale (GAD-7)  Pt will practice relaxation techniques daily    Progress on Goals:  progressing   Interventions: CBT and Supportive  Summary: Virginia Trujillo is a 58 y.o. female who presents with symptoms of anxiety that have been lilfelong per patient's report but symptoms worsened when her mother died 15+ years ago. Also during the same 2 month period around her mother's death,her grandparents and her sister died. .Patient reports seeing a psyhiatrist once in early adolescence due to isolative behaviors and anxiety. She is seeing psychiatrist Dr. Avanell Bob. Patient reports no psychiatric hospitalizations. She has been seen intermittently by this clinician for several years.  Patient's reports still experiencing depression and anxiety is constantly worrying.  She is particularly stressed now about her youngest son who has legal issues.  She fears he may go to jail.  She also recently learned he is going to have another baby.   She also has what if thoughts about various other issues especially about her father dying.     Patient last was seen about 2 weeks ago.  Patient reports continued multiple stressors mainly related to family issues but managing well.  She continues to experience anxiety but reports successfully using deep breathing, distracting activities, and support from family and friends.  She also reports pacing self and improving self-care as being helpful.  She states taking time for self and is still looking forward to going on a cruise next month.  She expresses less worry about her son being in prison as he continues to work as a Barrister's clerk.  She is hopeful he will be released by the end of the year.  She continues to express frustration regarding his ex-girlfriend's behavior.  Patient continues to spend a lot of time taking care of her 3 grandchildren but reports enjoying this and missing them when they are not with her.  She continues to express appropriate concern about her father but is not overwhelmed by this.     Suicidal/Homicidal: None        Therapist Response: reviewed symptoms, discussed stressors, facilitated expression of thoughts and feelings, validated feelings, praised and reinforced pt's use of healthy coping strategies, discussed ways to maintain consistency, also reiterated the role of behavioral activation and coping with depression, discussed activity planning when she is not keeping her grandchildren,discussed next steps for treatment to include relapse prevention strategies Plan: Return again  in 2 weeks  Diagnosis: Axis I: Generalized Anxiety Disorder      Axis II: No diagnosis Collaboration of Care: Psychiatrist AEB patient sees psychiatrist Dr. Avanell Bob in this practice for medication management  Patient/Guardian was advised Release of Information must be obtained prior to any record release in order to collaborate their care with an outside provider. Patient/Guardian was advised if they have  not already done so to contact the registration department to sign all necessary forms in order for us  to release information regarding their care.   Consent: Patient/Guardian gives verbal consent for treatment and assignment of benefits for services provided during this visit. Patient/Guardian expressed understanding and agreed to proceed.    Dicie Foster, LCSW 09/04/2023

## 2023-09-27 ENCOUNTER — Other Ambulatory Visit (HOSPITAL_COMMUNITY): Payer: Self-pay | Admitting: Psychiatry

## 2023-10-05 DIAGNOSIS — G4733 Obstructive sleep apnea (adult) (pediatric): Secondary | ICD-10-CM | POA: Diagnosis not present

## 2023-10-16 ENCOUNTER — Telehealth (HOSPITAL_COMMUNITY): Admitting: Psychiatry

## 2023-10-20 DIAGNOSIS — M797 Fibromyalgia: Secondary | ICD-10-CM | POA: Diagnosis not present

## 2023-10-20 DIAGNOSIS — Z6841 Body Mass Index (BMI) 40.0 and over, adult: Secondary | ICD-10-CM | POA: Diagnosis not present

## 2023-10-20 DIAGNOSIS — E538 Deficiency of other specified B group vitamins: Secondary | ICD-10-CM | POA: Diagnosis not present

## 2023-10-20 DIAGNOSIS — M791 Myalgia, unspecified site: Secondary | ICD-10-CM | POA: Diagnosis not present

## 2023-10-23 ENCOUNTER — Telehealth (HOSPITAL_COMMUNITY): Admitting: Psychiatry

## 2023-10-30 ENCOUNTER — Ambulatory Visit (HOSPITAL_COMMUNITY): Admitting: Psychiatry

## 2023-11-04 DIAGNOSIS — G4733 Obstructive sleep apnea (adult) (pediatric): Secondary | ICD-10-CM | POA: Diagnosis not present

## 2023-11-05 ENCOUNTER — Ambulatory Visit (INDEPENDENT_AMBULATORY_CARE_PROVIDER_SITE_OTHER): Admitting: Psychiatry

## 2023-11-05 ENCOUNTER — Encounter (HOSPITAL_COMMUNITY): Payer: Self-pay | Admitting: Psychiatry

## 2023-11-05 VITALS — BP 144/86 | HR 70 | Ht 63.0 in | Wt 255.0 lb

## 2023-11-05 DIAGNOSIS — F411 Generalized anxiety disorder: Secondary | ICD-10-CM

## 2023-11-05 DIAGNOSIS — F321 Major depressive disorder, single episode, moderate: Secondary | ICD-10-CM

## 2023-11-05 MED ORDER — ZOLPIDEM TARTRATE 10 MG PO TABS: 10.0000 mg | ORAL_TABLET | Freq: Every evening | ORAL | 2 refills | Status: DC | PRN

## 2023-11-05 MED ORDER — FLUOXETINE HCL 20 MG PO CAPS: 20.0000 mg | ORAL_CAPSULE | Freq: Every day | ORAL | 2 refills | Status: DC

## 2023-11-05 MED ORDER — ARIPIPRAZOLE 2 MG PO TABS: 2.0000 mg | ORAL_TABLET | Freq: Every morning | ORAL | 1 refills | Status: DC

## 2023-11-05 MED ORDER — ALPRAZOLAM 1 MG PO TABS: 1.0000 mg | ORAL_TABLET | Freq: Three times a day (TID) | ORAL | 2 refills | Status: DC

## 2023-11-05 NOTE — Progress Notes (Signed)
 [facility] MD/PA/NP OP Progress Note  11/05/2023 10:00 AM Virginia Trujillo  MRN:  [id number]  Chief Complaint:  Chief Complaint  Patient presents with   Depression   Anxiety   Follow-up   HPI: This patient is a 58 year old married black female who lives with her husband in [place]. She is unemployed   The patient returns for follow-up after 3 months regarding her depression and anxiety.  She states she still having a lot of health problems such as asthma attacks and arthritic pain in her shoulder and knees.  However she looks well groomed and dressed in fairly upbeat.  However she states that she feels more depressed and does not think the Cymbalta  is helping much.  She has also tried Lexapro and Wellbutrin.  I suggested a switch to Prozac and she is in agreement.  She is still sleeping well with the Ambien  and the Xanax  continues to help her anxiety. Visit Diagnosis:    ICD-10-CM   1. Generalized anxiety disorder  F41.1     2. 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 episode (HCC)  F32.1       Past Psychiatric History: none  Past Medical History:  Past Medical History:  Diagnosis Date   Acid reflux    Anxiety    Asthma    Depression    Fibromyalgia    Sleep apnea     Past Surgical History:  Procedure Laterality Date   CESAREAN SECTION     CHOLECYSTECTOMY N/A 08/14/2021   Procedure: LAPAROSCOPIC CHOLECYSTECTOMY;  Surgeon: Evonnie Dorothyann LABOR, DO;  Location: [facility];  Service: General;  Laterality: N/A;   MOUTH SURGERY     TUBAL LIGATION     tubes tied      Family Psychiatric History: See below  Family History:  Family History  Problem Relation Age of Onset   Hypertension Father    Hypertension Brother    Diabetes Brother    Depression Brother    Anxiety disorder Brother    Stroke Maternal Grandmother    Stroke Paternal Grandfather    Congestive Heart Failure Sister    Diabetes Sister    Hypertension Sister    Anxiety disorder Sister     Depression Sister    Heart disease Unknown    Arthritis Unknown    Cancer Unknown    Asthma Unknown    Diabetes Unknown    Kidney disease Unknown     Social History:  Social History   Socioeconomic History   Marital status: Married    Spouse name: Not on file   Number of children: 2   Years of education: 12   Highest education level: Not on file  Occupational History   Occupation: none    Employer: UNEMPLOYED   Occupation: Unemployed   Tobacco Use   Smoking status: Never    Passive exposure: Never   Smokeless tobacco: Never  Vaping Use   Vaping status: Never Used  Substance and Sexual Activity   Alcohol use: No   Drug use: No   Sexual activity: Yes    Birth control/protection: Surgical  Other Topics Concern   Not on file  Social History Narrative   Patient lives at home with husband and son.    Patient does not work   Patient has a high school education    Patient has 2 children.   Social Drivers of Corporate investment banker Strain: Not on file  Food Insecurity: Not on file  Transportation Needs: Not on file  Physical Activity: Not on file  Stress: Not on file  Social Connections: Not on file    Allergies:  Allergies  Allergen Reactions   Prednisone Other (See Comments)    Depression and crying   Penicillins     Metabolic Disorder Labs: No results found for: HGBA1C, MPG No results found for: PROLACTIN No results found for: CHOL, TRIG, HDL, CHOLHDL, VLDL, LDLCALC Lab Results  Component Value Date   TSH 3.190 11/29/2013    Therapeutic Level Labs: No results found for: LITHIUM No results found for: VALPROATE No results found for: CBMZ  Current Medications: Current Outpatient Medications  Medication Sig Dispense Refill   albuterol  (PROVENTIL ) (2.5 MG/3ML) 0.083% nebulizer solution Take 2.5 mg by nebulization every 6 (six) hours as needed for wheezing or shortness of breath.     albuterol  (VENTOLIN  HFA) 108 (90 Base)  MCG/ACT inhaler Inhale 2 puffs into the lungs every 4 (four) hours as needed for wheezing or shortness of breath.     baclofen (LIORESAL) 10 MG tablet Take 10 mg by mouth 3 (three) times daily.     benzonatate  (TESSALON ) 200 MG capsule Take 1 capsule (200 mg total) by mouth 3 (three) times daily as needed. 21 capsule 0   FLUoxetine (PROZAC) 20 MG capsule Take 1 capsule (20 mg total) by mouth daily. 30 capsule 2   montelukast (SINGULAIR) 10 MG tablet Take 10 mg by mouth daily.     oseltamivir  (TAMIFLU ) 75 MG capsule Take 1 capsule (75 mg total) by mouth every 12 (twelve) hours. 10 capsule 0   pantoprazole  (PROTONIX ) 40 MG tablet Take 40 mg by mouth daily.     traMADol-acetaminophen  (ULTRACET) 37.5-325 MG tablet Take 2 tablets by mouth every 6 (six) hours as needed for moderate pain or severe pain.     ALPRAZolam  (XANAX ) 1 MG tablet Take 1 tablet (1 mg total) by mouth 3 (three) times daily. 90 tablet 2   ARIPiprazole  (ABILIFY ) 2 MG tablet Take 1 tablet (2 mg total) by mouth every morning. 90 tablet 1   zolpidem  (AMBIEN ) 10 MG tablet Take 1 tablet (10 mg total) by mouth at bedtime as needed for sleep. 30 tablet 2   No current facility-administered medications for this visit.     Musculoskeletal: Strength & Muscle Tone: within normal limits Gait & Station: normal Patient leans: N/A  Psychiatric Specialty Exam: Review of Systems  Respiratory:  Positive for shortness of breath.   Musculoskeletal:  Positive for arthralgias.  Psychiatric/Behavioral:  Positive for dysphoric mood.   All other systems reviewed and are negative.   Blood pressure (!) 144/86, pulse 70, height 5' 3 (1.6 m), weight 255 lb (115.7 kg), SpO2 99%.Body mass index is 45.17 kg/m.  General Appearance: Casual, Neat, and Well Groomed  Eye Contact:  Good  Speech:  Clear and Coherent  Volume:  Normal  Mood:  Dysphoric  Affect:  Congruent  Thought Process:  Goal Directed  Orientation:  Full (Time, Place, and Person)   Thought Content: Rumination   Suicidal Thoughts:  No  Homicidal Thoughts:  No  Memory:  Immediate;   Good Recent;   Good Remote;   NA  Judgement:  Good  Insight:  Fair  Psychomotor Activity:  Normal  Concentration:  Concentration: Good and Attention Span: Good  Recall:  Good  Fund of Knowledge: Good  Language: Good  Akathisia:  No  Handed:  Right  AIMS (if indicated): not done  Assets:  Communication  Skills Desire for Improvement Resilience Social Support Talents/Skills  ADL's:  Intact  Cognition: WNL  Sleep:  Good   Screenings: GAD-7    Flowsheet Row Office Visit from 11/05/2023 in [facility] [place] Counselor from 08/21/2023 in [facility] [place] Counselor from 04/20/2023 in [facility] at [place] Counselor from 04/06/2023 in [facility] at [place] Counselor from 03/20/2023 in [facility] at [place]  Total GAD-7 Score 12 20 17 21 21    PHQ2-9    Flowsheet Row Office Visit from 11/05/2023 in [facility] [place] Counselor from 04/06/2023 in [facility] at [place] Counselor from 08/08/2022 in [facility] at [place] Counselor from 07/01/2022 in [facility] at [place] Video Visit from 12/16/2021 in [facility] [place] [facility] Total Score 4 5 4 5 1   PHQ-9 Total Score 10 17 14 23  --   Flowsheet Row ED from 06/08/2023 in [facility] at [facility] ED from 05/30/2023 in [facility] Department at [facility] Counselor from 04/06/2023 in [facility] at [place]  C-SSRS RISK CATEGORY No Risk No Risk Moderate Risk     Assessment and Plan: This patient is a 58 year old female with a history of  depression and anxiety.  She no longer thinks that Cymbalta  is helpful so we will switch to Prozac 20 mg daily for depression.  She will continue Abilify  2 mg daily for augmentation, Xanax  1 mg up to 3 times daily as needed for anxiety and Ambien  10 mg at bedtime as needed for sleep.  She will return to see me in 3 months  Collaboration of Care: Collaboration of Care: Referral or follow-up with counselor/therapist AEB patient will continue therapy with Winton Rubinstein in our office  Patient/Guardian was patient will continue therapy with Winton Rubinstein in our office vised Release of Information must be obtained prior to any record release in order to collaborate their care with an outside provider. Patient/Guardian was advised if they have not already done so to contact the registration department to sign all necessary forms in order for us  to release information regarding their care.   Consent: Patient/Guardian gives verbal consent for treatment and assignment of benefits for services provided during this visit. Patient/Guardian expressed understanding and agreed to proceed.    Barnie Gull, MD 11/05/2023, 10:00 AM

## 2023-11-15 DIAGNOSIS — M5412 Radiculopathy, cervical region: Secondary | ICD-10-CM | POA: Diagnosis not present

## 2023-11-16 ENCOUNTER — Ambulatory Visit (HOSPITAL_COMMUNITY): Admitting: Psychiatry

## 2023-11-30 ENCOUNTER — Other Ambulatory Visit (HOSPITAL_COMMUNITY): Payer: Self-pay | Admitting: Psychiatry

## 2023-11-30 ENCOUNTER — Ambulatory Visit (INDEPENDENT_AMBULATORY_CARE_PROVIDER_SITE_OTHER): Admitting: Psychiatry

## 2023-11-30 DIAGNOSIS — F411 Generalized anxiety disorder: Secondary | ICD-10-CM | POA: Diagnosis not present

## 2023-11-30 NOTE — Progress Notes (Signed)
 Virtual Visit via Telephone Note  I connected with Virginia Trujillo on 11/30/23 at 11:02 AM EST by telephone and verified that I am speaking with the correct person using two identifiers.  Location: Patient: Home Provider: Home office   I discussed the limitations, risks, security and privacy concerns of performing an evaluation and management service by telephone and the availability of in person appointments. I also discussed with the patient that there may be a patient responsible charge related to this service. The patient expressed understanding and agreed to proceed.    I provided  minutes of non-face-to-face time during this encounter.   Winton FORBES Rubinstein, LCSW                              THERAPIST PROGRESS NOTE     Session Time: Monday 11/30/2023 11:02 AM - 11;45 AM  Participation Level: Active  Behavioral Response: talkative  Type of Therapy: Individual Therapy  Treatment Goals addressed: Virginia Trujillo will score less than 5 on the Generalized Anxiety Disorder 7 Scale (GAD-7)  Pt will practice relaxation techniques daily    Progress on Goals:  progressing   Interventions: CBT and Supportive  Summary: Virginia Trujillo is a 58 y.o. female who presents with symptoms of anxiety that have been lilfelong per patient's report but symptoms worsened when her mother died 15+ years ago. Also during the same 2 month period around her mother's death,her grandparents and her sister died. .Patient reports seeing a psyhiatrist once in early adolescence due to isolative behaviors and anxiety. She is seeing psychiatrist Dr. Okey. Patient reports no psychiatric hospitalizations. She has been seen intermittently by this clinician for several years.  Patient's reports still experiencing depression and anxiety is constantly worrying.  She is particularly stressed now about her youngest son who has legal issues.  She fears he may go to jail.  She also recently learned he is going to have another baby.   She also has what if thoughts about various other issues especially about her father dying.     Patient last was seen about 21/2 months ago.  Patient reports decreased stress and anxiety since last session.  She expresses less worry about her 3 grandchildren as she says their mother is making better decisions.  She also is hopeful about son who is incarcerated being discharged from prison either around Thanksgiving or in February.  Patient maintains involvement in various activities including socializing with family and friends.  However, she reports becoming depressed due to experiencing severe pain in her arm.  She expresses frustration as she is unable to perform activities like she normally does.  She reports having to make adjustments regarding taking a shower, wearing certain types of clothing due to the pain in her arm.  Patient states sometimes having to use her other arm to lift the hurting arm.  She also reports being unable to do household tasks the way she would like but pushes self.  However, she reports being in severe pain at night if she pushes self.  She has scheduled an appointment to see a specialist tomorrow morning and is hopeful she will receive help.  Patient is looking forward to the birth of of her grandchild as the mother will be induced Friday.  Patient will accompany her and is looking forward to cutting the umbilical cord.  Patient reports she has continued to use deep breathing, distracting activities.  She also reports taking  different medication (Prozac ) as prescribed by psychiatrist Dr. Okey has been very helpful.    Suicidal/Homicidal: None        Therapist Response: reviewed symptoms, discussed stressors, facilitated expression of thoughts and feelings, validated feelings, praised and reinforced pt's use of healthy coping strategies, assisted patient identify realistic expectations of self regarding activities that may affect her level of pain, assisted patient identify ways to  pace self, begin to discuss lapse versus relapse of depression, Plan: Return again in 2 weeks  Diagnosis: Axis I: Generalized Anxiety Disorder      Axis II: No diagnosis Collaboration of Care: Psychiatrist AEB patient sees psychiatrist Dr. Okey in this practice for medication management  Patient/Guardian was advised Release of Information must be obtained prior to any record release in order to collaborate their care with an outside provider. Patient/Guardian was advised if they have not already done so to contact the registration department to sign all necessary forms in order for us  to release information regarding their care.   Consent: Patient/Guardian gives verbal consent for treatment and assignment of benefits for services provided during this visit. Patient/Guardian expressed understanding and agreed to proceed.    Winton FORBES Rubinstein, LCSW 11/30/2023

## 2023-12-01 DIAGNOSIS — M25511 Pain in right shoulder: Secondary | ICD-10-CM | POA: Diagnosis not present

## 2023-12-01 DIAGNOSIS — M79621 Pain in right upper arm: Secondary | ICD-10-CM | POA: Diagnosis not present

## 2023-12-01 DIAGNOSIS — G8929 Other chronic pain: Secondary | ICD-10-CM | POA: Diagnosis not present

## 2023-12-01 DIAGNOSIS — Z1331 Encounter for screening for depression: Secondary | ICD-10-CM | POA: Diagnosis not present

## 2023-12-05 DIAGNOSIS — G4733 Obstructive sleep apnea (adult) (pediatric): Secondary | ICD-10-CM | POA: Diagnosis not present

## 2023-12-14 ENCOUNTER — Ambulatory Visit (HOSPITAL_COMMUNITY): Admitting: Psychiatry

## 2023-12-28 ENCOUNTER — Ambulatory Visit (INDEPENDENT_AMBULATORY_CARE_PROVIDER_SITE_OTHER): Admitting: Psychiatry

## 2023-12-28 DIAGNOSIS — F411 Generalized anxiety disorder: Secondary | ICD-10-CM

## 2023-12-28 NOTE — Progress Notes (Signed)
 Virtual Visit via Telephone Note  I connected with Virginia Trujillo on 12/28/23 at 9:08 AM EST by telephone and verified that I am speaking with the correct person using two identifiers.  Location: Patient: Home Provider: Home office   I discussed the limitations, risks, security and privacy concerns of performing an evaluation and management service by telephone and the availability of in person appointments. I also discussed with the patient that there may be a patient responsible charge related to this service. The patient expressed understanding and agreed to proceed.    I provided  57 minutes of non-face-to-face time during this encounter.   Virginia FORBES Rubinstein, LCSW                              THERAPIST PROGRESS NOTE     Session Time: Monday 12/28/2023 9:08 AM - 10:05 AM  Participation Level: Active  Behavioral Response: talkative  Type of Therapy: Individual Therapy  Treatment Goals addressed: [name] will score less than 5 on the Generalized Anxiety Disorder 7 Scale (GAD-7)  Pt will practice relaxation techniques daily    Progress on Goals:  progressing   Interventions: CBT and Supportive  Summary: Virginia Trujillo is a 58 y.o. female who presents with symptoms of anxiety that have been lilfelong per patient's report but symptoms worsened when her mother died 15+ years ago. Also during the same 2 month period around her mother's death,her grandparents and her sister died. .Patient reports seeing a psyhiatrist once in early adolescence due to isolative behaviors and anxiety. She is seeing psychiatrist Dr. Okey. Patient reports no psychiatric hospitalizations. She has been seen intermittently by this clinician for several years.  Patient's reports still experiencing depression and anxiety is constantly worrying.  She is particularly stressed now about her youngest son who has legal issues.  She fears he may go to jail.  She also recently learned he is going to have another baby.   She also has what if thoughts about various other issues especially about her father dying.     Patient last was seen about a month ago.  Patient reports increased stress and anxiety since last session.  She continues to face multiple stressors including son being incarcerated and caretaker responsibilities for her grandchildren.  However, she reports being most stressed by continued shoulder and arm pain.  Per patient's report, none of the over the counter medications or heat or ice alleviate the pain.  She is working with her medical provider who is planning to administer an injection.  However, patient reports she has to wait for an ultrasound on the arm to rule out possibility of blood clots before the injection.  She expresses frustration as she is unable to use the arm in the manner she wishes.  She also is experiencing significant sleep difficulty due to the pain.  She states sleeping only about 3 to 4 hours per night.  She also reports sometimes becoming depressed and having difficulty concentrating on using her coping skills.  She reports it has been helpful to take care of her grandchildren as this provides a distraction.  She also reports recently having another grandchild and being part of the birthing experience with the mother.  Patient reports sometimes becoming down as she thinks about her son being incarcerated.  However, she is looking forward to his return home in November.  She continues to have strong support from her husband and her  son.       Suicidal/Homicidal: None        Therapist Response: reviewed symptoms, discussed stressors, facilitated expression of thoughts and feelings, validated feelings, praised and reinforced pt's use of behavioral activation and distracting activities, assisted patient identify effects on mood/thought/behavior, developed plan with patient to try to maintain consistent involvement, began to assist patient identify her thought patterns about coping with  pain and the effects on her mood/behavior, began to assist patient identify coping statements, began to discuss the effects of involvement in pleasant activities on coping with pain, reviewed treatment plan, sent signature page and treatment plan to patient via MyChart, began to discuss next steps for treatment to focus more on mindfulness/relapse prevention strategies/coping with pain  Plan: Return again in 2 weeks  Diagnosis: Axis I: Generalized Anxiety Disorder      Axis II: No diagnosis Collaboration of Care: Psychiatrist AEB patient sees psychiatrist Dr. Okey in this practice for medication management  Patient/Guardian was advised Release of Information must be obtained prior to any record release in order to collaborate their care with an outside provider. Patient/Guardian was advised if they have not already done so to contact the registration department to sign all necessary forms in order for us  to release information regarding their care.   Consent: Patient/Guardian gives verbal consent for treatment and assignment of benefits for services provided during this visit. Patient/Guardian expressed understanding and agreed to proceed.    Virginia FORBES Rubinstein, LCSW 12/28/2023

## 2024-01-05 DIAGNOSIS — G4733 Obstructive sleep apnea (adult) (pediatric): Secondary | ICD-10-CM | POA: Diagnosis not present

## 2024-01-15 ENCOUNTER — Ambulatory Visit (HOSPITAL_COMMUNITY): Admitting: Psychiatry

## 2024-02-01 ENCOUNTER — Emergency Department (HOSPITAL_COMMUNITY)

## 2024-02-01 ENCOUNTER — Encounter (HOSPITAL_COMMUNITY): Payer: Self-pay

## 2024-02-01 ENCOUNTER — Emergency Department (HOSPITAL_COMMUNITY)
Admission: EM | Admit: 2024-02-01 | Discharge: 2024-02-01 | Disposition: A | Discharge status: Home / Self Care | Attending: Emergency Medicine | Admitting: Emergency Medicine

## 2024-02-01 ENCOUNTER — Other Ambulatory Visit: Payer: Self-pay

## 2024-02-01 ENCOUNTER — Ambulatory Visit (HOSPITAL_COMMUNITY): Admitting: Psychiatry

## 2024-02-01 DIAGNOSIS — R062 Wheezing: Secondary | ICD-10-CM | POA: Insufficient documentation

## 2024-02-01 DIAGNOSIS — M25511 Pain in right shoulder: Secondary | ICD-10-CM | POA: Insufficient documentation

## 2024-02-01 DIAGNOSIS — M47812 Spondylosis without myelopathy or radiculopathy, cervical region: Secondary | ICD-10-CM | POA: Diagnosis not present

## 2024-02-01 DIAGNOSIS — M4722 Other spondylosis with radiculopathy, cervical region: Secondary | ICD-10-CM | POA: Diagnosis not present

## 2024-02-01 DIAGNOSIS — M19011 Primary osteoarthritis, right shoulder: Secondary | ICD-10-CM | POA: Diagnosis not present

## 2024-02-01 DIAGNOSIS — M542 Cervicalgia: Secondary | ICD-10-CM | POA: Diagnosis not present

## 2024-02-01 DIAGNOSIS — M4802 Spinal stenosis, cervical region: Secondary | ICD-10-CM | POA: Diagnosis not present

## 2024-02-01 DIAGNOSIS — M5412 Radiculopathy, cervical region: Secondary | ICD-10-CM | POA: Diagnosis not present

## 2024-02-01 DIAGNOSIS — R9431 Abnormal electrocardiogram [ECG] [EKG]: Secondary | ICD-10-CM | POA: Diagnosis not present

## 2024-02-01 DIAGNOSIS — R0600 Dyspnea, unspecified: Secondary | ICD-10-CM | POA: Diagnosis not present

## 2024-02-01 DIAGNOSIS — M50822 Other cervical disc disorders at C5-C6 level: Secondary | ICD-10-CM | POA: Diagnosis not present

## 2024-02-01 DIAGNOSIS — R600 Localized edema: Secondary | ICD-10-CM | POA: Diagnosis not present

## 2024-02-01 LAB — COMPREHENSIVE METABOLIC PANEL WITH GFR
ALT: 19 U/L (ref 0–44)
AST: 22 U/L (ref 15–41)
Albumin: 3.6 g/dL (ref 3.5–5.0)
Alkaline Phosphatase: 63 U/L (ref 38–126)
Anion gap: 8 (ref 5–15)
BUN: 11 mg/dL (ref 6–20)
CO2: 26 mmol/L (ref 22–32)
Calcium: 8.8 mg/dL — ABNORMAL LOW (ref 8.9–10.3)
Chloride: 103 mmol/L (ref 98–111)
Creatinine, Ser: 0.92 mg/dL (ref 0.44–1.00)
GFR, Estimated: >60 mL/min (ref >60)
Glucose, Bld: 86 mg/dL (ref 70–99)
Potassium: 3.9 mmol/L (ref 3.5–5.1)
Sodium: 137 mmol/L (ref 135–145)
Total Bilirubin: 0.6 mg/dL (ref 0.0–1.2)
Total Protein: 7.3 g/dL (ref 6.5–8.1)

## 2024-02-01 LAB — CBC WITH DIFFERENTIAL/PLATELET
Abs Immature Granulocytes: 0.01 K/uL (ref 0.00–0.07)
Basophils Absolute: 0 K/uL (ref 0.0–0.1)
Basophils Relative: 1 %
Eosinophils Absolute: 0.1 K/uL (ref 0.0–0.5)
Eosinophils Relative: 2 %
HCT: 38.4 % (ref 36.0–46.0)
Hemoglobin: 12.4 g/dL (ref 12.0–15.0)
Immature Granulocytes: 0 %
Lymphocytes Relative: 36 %
Lymphs Abs: 1.9 K/uL (ref 0.7–4.0)
MCH: 32.3 pg (ref 26.0–34.0)
MCHC: 32.3 g/dL (ref 30.0–36.0)
MCV: 100 fL (ref 80.0–100.0)
Monocytes Absolute: 0.3 K/uL (ref 0.1–1.0)
Monocytes Relative: 6 %
Neutro Abs: 2.9 K/uL (ref 1.7–7.7)
Neutrophils Relative %: 55 %
Platelets: 166 K/uL (ref 150–400)
RBC: 3.84 MIL/uL — ABNORMAL LOW (ref 3.87–5.11)
RDW: 12.4 % (ref 11.5–15.5)
WBC: 5.3 K/uL (ref 4.0–10.5)
nRBC: 0 % (ref 0.0–0.2)

## 2024-02-01 LAB — URINALYSIS, ROUTINE W REFLEX MICROSCOPIC
Bacteria, UA: NONE SEEN
Bilirubin Urine: NEGATIVE
Glucose, UA: NEGATIVE mg/dL
Hgb urine dipstick: NEGATIVE
Ketones, ur: NEGATIVE mg/dL
Nitrite: NEGATIVE
Protein, ur: NEGATIVE mg/dL
Specific Gravity, Urine: 1.015 (ref 1.005–1.030)
pH: 6 (ref 5.0–8.0)

## 2024-02-01 LAB — TROPONIN I (HIGH SENSITIVITY): Troponin I (High Sensitivity): <2 ng/L (ref <18)

## 2024-02-01 MED ORDER — NAPROXEN 375 MG PO TABS: 375.0000 mg | ORAL_TABLET | Freq: Two times a day (BID) | ORAL | 0 refills | Status: AC

## 2024-02-01 MED ORDER — KETOROLAC TROMETHAMINE 15 MG/ML IJ SOLN
15.0000 mg | Freq: Once | INTRAMUSCULAR | Status: AC
  Administered 2024-02-01: 15 mg via INTRAVENOUS
  Filled 2024-02-01: qty 1

## 2024-02-01 MED ORDER — IPRATROPIUM-ALBUTEROL 0.5-2.5 (3) MG/3ML IN SOLN
3.0000 mL | Freq: Once | RESPIRATORY_TRACT | Status: AC
  Administered 2024-02-01: 3 mL via RESPIRATORY_TRACT
  Filled 2024-02-01: qty 3

## 2024-02-01 MED ORDER — DEXAMETHASONE SODIUM PHOSPHATE 10 MG/ML IJ SOLN
10.0000 mg | Freq: Once | INTRAMUSCULAR | Status: AC
  Administered 2024-02-01: 10 mg via INTRAVENOUS
  Filled 2024-02-01: qty 1

## 2024-02-01 MED ORDER — METHOCARBAMOL 750 MG PO TABS: 750.0000 mg | ORAL_TABLET | Freq: Three times a day (TID) | ORAL | 0 refills | Status: AC

## 2024-02-01 MED ORDER — DIAZEPAM 5 MG/ML IJ SOLN
5.0000 mg | Freq: Once | INTRAMUSCULAR | Status: AC
  Administered 2024-02-01: 5 mg via INTRAVENOUS
  Filled 2024-02-01: qty 2

## 2024-02-01 NOTE — ED Triage Notes (Signed)
 Pt arrived via POV c/o right shoulder and neck pain for over the past month. Pt denies injury and describes pain as a burning sensation in her shoulder. Pt reports pain has started causing her to experience headaches.

## 2024-02-01 NOTE — ED Provider Notes (Signed)
 [place] EMERGENCY DEPARTMENT AT [facility] Provider Note   CSN: [id number] Arrival date & time: 02/01/24  9072     Patient presents with: Arm Pain   Virginia Trujillo is a 58 y.o. female.   Patient is a 57 year old female who presents emergency department the chief complaint of pain to the right side of her neck and right shoulder which has been ongoing for approximate the past month.  She notes that the pain does radiate down her right arm.  She also notes that she has had some mild swelling to her arm as well.  She denies any overlying redness or warmth.  She notes that she was previously seen by an orthopedic provider but this 1 has left the practice and she is trying to find another one.  Patient notes that she has had some intermittent palpitations and shortness of breath.  She denies any active chest pain at this time.  She has had no abdominal pain, nausea, vomiting, diarrhea.  She has had some increased frequency and headaches as well.  She denies any other numbness, paresthesias or unilateral weakness.  She does describe the pain as a burning sensation.   Arm Pain       Prior to Admission medications   Medication Sig Start Date End Date Taking? Authorizing Provider  methocarbamol (ROBAXIN) 750 MG tablet Take 1 tablet (750 mg total) by mouth 3 (three) times daily. 02/01/24  Yes Daralene Bruckner D, PA-C  naproxen (NAPROSYN) 375 MG tablet Take 1 tablet (375 mg total) by mouth 2 (two) times daily. 02/01/24  Yes Daralene Bruckner D, PA-C  albuterol  (PROVENTIL ) (2.5 MG/3ML) 0.083% nebulizer solution Take 2.5 mg by nebulization every 6 (six) hours as needed for wheezing or shortness of breath.    [provider]  albuterol  (VENTOLIN  HFA) 108 (90 Base) MCG/ACT inhaler Inhale 2 puffs into the lungs every 4 (four) hours as needed for wheezing or shortness of breath. 06/06/21   [provider]  ALPRAZolam  (XANAX ) 1 MG tablet Take 1 tablet (1 mg total) by mouth  3 (three) times daily. 11/05/23   Okey Barnie SAUNDERS, MD  ARIPiprazole  (ABILIFY ) 2 MG tablet Take 1 tablet (2 mg total) by mouth every morning. 11/05/23   Okey Barnie SAUNDERS, MD  benzonatate  (TESSALON ) 200 MG capsule Take 1 capsule (200 mg total) by mouth 3 (three) times daily as needed. 05/30/23   Triplett, Tammy, PA-C  FLUoxetine  (PROZAC ) 20 MG capsule TAKE 1 CAPSULE BY MOUTH EVERY DAY 11/30/23   Okey Barnie SAUNDERS, MD  montelukast (SINGULAIR) 10 MG tablet Take 10 mg by mouth daily. 08/02/21   [provider]  oseltamivir  (TAMIFLU ) 75 MG capsule Take 1 capsule (75 mg total) by mouth every 12 (twelve) hours. 05/30/23   Triplett, Tammy, PA-C  pantoprazole  (PROTONIX ) 40 MG tablet Take 40 mg by mouth daily. 08/02/21   [provider]  traMADol-acetaminophen  (ULTRACET) 37.5-325 MG tablet Take 2 tablets by mouth every 6 (six) hours as needed for moderate pain or severe pain. 12/04/21   [provider]  zolpidem  (AMBIEN ) 10 MG tablet Take 1 tablet (10 mg total) by mouth at bedtime as needed for sleep. 11/05/23 02/03/24  Okey Barnie SAUNDERS, MD    Allergies: Prednisone and Penicillins    Review of Systems  Musculoskeletal:        Right sided neck and shoulder pain  All other systems reviewed and are negative.   Updated Vital Signs BP (!) 140/70 (BP Location: Left  Arm)   Pulse 66   Temp 98.2 F (36.8 C) (Oral)   Resp 18   Ht 5' 3 (1.6 m)   Wt 115.7 kg   SpO2 99%   BMI 45.18 kg/m   Physical Exam Vitals and nursing note reviewed.  Constitutional:      General: She is not in acute distress.    Appearance: Normal appearance. She is not ill-appearing.  HENT:     Head: Normocephalic and atraumatic.     Nose: Nose normal.     Mouth/Throat:     Mouth: Mucous membranes are moist.  Eyes:     Extraocular Movements: Extraocular movements intact.     Conjunctiva/sclera: Conjunctivae normal.     Pupils: Pupils are equal, round, and reactive to light.  Neck:     Comments: Tenderness along  the right trapezius muscle and right paraspinous muscles, no step-off or deformity Cardiovascular:     Rate and Rhythm: Normal rate and regular rhythm.     Pulses: Normal pulses.     Heart sounds: Normal heart sounds. No murmur heard.    No gallop.  Pulmonary:     Effort: Pulmonary effort is normal. No respiratory distress.     Breath sounds: No stridor. Wheezing present. No rhonchi or rales.  Abdominal:     General: Abdomen is flat. Bowel sounds are normal. There is no distension.     Palpations: Abdomen is soft.     Tenderness: There is no abdominal tenderness. There is no guarding.  Musculoskeletal:        General: Normal range of motion.     Cervical back: Normal range of motion and neck supple. No rigidity.     Right lower leg: No edema.     Left lower leg: No edema.     Comments: Tenderness palpation noted over the right shoulder, nontender palpation of over the remaining bilateral upper and lower extremities, radial pulse 2+ distally, radial, ulnar, median, axillary nerve function intact distally, no overlying erythema or warmth, minimal edema, no areas of induration, fluctuance, no obvious deformity or bruising, no skin breakdown ulceration, lacerations or abrasions  Skin:    General: Skin is warm and dry.     Findings: No bruising or rash.  Neurological:     General: No focal deficit present.     Mental Status: She is alert and oriented to person, place, and time. Mental status is at baseline.     Cranial Nerves: No cranial nerve deficit.     Sensory: No sensory deficit.     Motor: No weakness.     Coordination: Coordination normal.     Gait: Gait normal.  Psychiatric:        Mood and Affect: Mood normal.        Behavior: Behavior normal.        Thought Content: Thought content normal.        Judgment: Judgment normal.     (all labs ordered are listed, but only abnormal results are displayed) Labs Reviewed  COMPREHENSIVE METABOLIC PANEL WITH GFR - Abnormal; Notable  for the following components:      Result Value   Calcium 8.8 (*)    All other components within normal limits  CBC WITH DIFFERENTIAL/PLATELET - Abnormal; Notable for the following components:   RBC 3.84 (*)    All other components within normal limits  URINALYSIS, ROUTINE W REFLEX MICROSCOPIC - Abnormal; Notable for the following components:   APPearance HAZY (*)  Leukocytes,Ua LARGE (*)    All other components within normal limits  TROPONIN I (HIGH SENSITIVITY)    EKG: None  Radiology: [facility] Chest Port 1 View Result Date: 02/01/2024 CLINICAL DATA:  Dyspnea. EXAM: PORTABLE CHEST 1 VIEW COMPARISON:  06/08/2023. FINDINGS: The heart size and mediastinal contours are unchanged. No overt pulmonary edema, focal consolidation, pleural effusion, or pneumothorax. Dextrocurvature of the thoracic spine. No acute osseous abnormality. IMPRESSION: No acute cardiopulmonary findings. Electronically Signed   By: Harrietta Sherry M.D.   On: 02/01/2024 14:32   US  Venous Img Upper Right (DVT Study) Result Date: 02/01/2024 EXAM: US  Duplex right Upper Extremity Veins. TECHNIQUE: Real-time ultrasound scan of the veins of the right upper extremity with color Doppler flow, spectral waveform analysis and compression. COMPARISON: None. CLINICAL HISTORY: edema. FINDINGS: SUPERFICIAL VEINS: The cephalic and basilic veins are compressible, and demonstrate normal color Doppler flow. DEEP VEINS: The internal jugular, subclavian, axillary and brachial veins are compressible, and demonstrate normal color Doppler flow. SOFT TISSUES: No acute finding. Limited images of the contralateral left subclavian vein are unremarkable. IMPRESSION: 1. No deep venous thrombosis in the right upper extremity. Electronically signed by: Katheleen Faes MD 02/01/2024 01:46 PM EDT RP Workstation: [id number]   MR Cervical Spine Wo Contrast Result Date: 02/01/2024 CLINICAL DATA:  Cervical radiculopathy, no red flags Headache and neck pain EXAM: MRI  CERVICAL SPINE WITHOUT CONTRAST TECHNIQUE: Multiplanar, multisequence MR imaging of the cervical spine was performed. No intravenous contrast was administered. COMPARISON:  Radiographs 02/01/2024.  MRI 07/03/2017. FINDINGS: Alignment: Reversal of the usual cervical lordosis and mild scoliosis, similar to previous MRI. No focal angulation or listhesis. Vertebrae: No acute or suspicious osseous findings. Chronic C4 hemangioma. Cord: Chronic right-sided cord flattening at C5-6. No abnormal cord signal identified. Posterior Fossa, vertebral arteries, paraspinal tissues: Visualized portions of the posterior fossa appear unremarkable.Bilateral vertebral artery flow voids. No significant paraspinal findings. Disc levels: C2-3: Normal interspace. C3-4: Chronic spondylosis with disc bulging, a shallow central disc protrusion and mild uncinate spurring bilaterally. Stable mild spinal stenosis without cord deformity. No significant foraminal narrowing. C4-5: Chronic spondylosis with stable disc bulging and bilateral uncinate spurring. Stable mild spinal stenosis without cord deformity. Stable mild left foraminal narrowing. C5-6: Chronic spondylosis with loss of disc height, a chronic right paracentral disc osteophyte complex and bilateral uncinate spurring. Chronic right-sided cord flattening without abnormal cord signal. Chronic right-greater-than-left foraminal narrowing. Findings at this level appear minimally progressive from remote MRI. C6-7: Mild disc bulging. No spinal stenosis or significant foraminal narrowing. C7-T1: Normal interspace. IMPRESSION: 1. No acute findings or clear explanation for the patient's symptoms. 2. Chronic spondylosis at C5-6 with a chronic right paracentral disc osteophyte complex causing chronic right-sided cord flattening and right-greater-than-left foraminal narrowing. Findings at this level appear minimally progressive from remote MRI. 3. Stable mild spondylosis at the additional levels  without high-grade spinal stenosis or nerve root encroachment. 4. No abnormal cord signal demonstrated. Electronically Signed   By: Elsie Perone M.D.   On: 02/01/2024 13:09   MR Brain Wo Contrast (neuro protocol) Result Date: 02/01/2024 EXAM: MRI BRAIN WITHOUT CONTRAST 02/01/2024 12:41:16 PM TECHNIQUE: Multiplanar multisequence MRI of the head/brain was performed without the administration of intravenous contrast. COMPARISON: CT head 04/09/2022. CLINICAL HISTORY: Syncope/presyncope, cerebrovascular cause suspected. Ha \\T \ neck pain. FINDINGS: BRAIN AND VENTRICLES: No acute infarct. No intracranial hemorrhage. No mass. No midline shift. No hydrocephalus. The sella is unremarkable. Normal flow voids. Degenerative changes in the visualized cervical spine. There is a disc osteophyte  complex at C5-C6 resulting in at least mild spinal canal stenosis with likely indentation of the ventral cervical spinal cord. Consider dedicated imaging of the cervical spine for further evaluation. ORBITS: No acute abnormality. SINUSES AND MASTOIDS: No acute abnormality. BONES AND SOFT TISSUES: Normal marrow signal. No acute soft tissue abnormality. IMPRESSION: 1. No acute intracranial abnormality. 2. Degenerative changes in the visualized cervical spine with disc osteophyte complex at C5-C6 resulting in at least mild spinal canal stenosis and likely indentation of the ventral cervical spinal cord. Electronically signed by: Donnice Mania MD 02/01/2024 01:09 PM EDT RP Workstation: [id number]   DG Shoulder Right Result Date: 02/01/2024 CLINICAL DATA:  Right shoulder and neck pain 1 month.  No injury. EXAM: RIGHT SHOULDER - 2+ VIEW COMPARISON:  None Available. FINDINGS: Minimal degenerate change of the [facility] joint. Glenohumeral joint is within normal. No acute fracture or dislocation. Remainder of the exam is unremarkable. IMPRESSION: 1. No acute findings. 2. Minimal degenerative change of the [facility] joint. Electronically Signed   By: Toribio Agreste M.D.   On: 02/01/2024 10:47   DG Cervical Spine Complete Result Date: 02/01/2024 CLINICAL DATA:  Right shoulder neck pain over the past month. No injury. EXAM: CERVICAL SPINE - COMPLETE 4+ VIEW COMPARISON:  MRI 09/29/2014 FINDINGS: Vertebral body alignment and heights are normal. Mild disc space narrowing at the C5-6 level. There is mild spondylosis throughout the cervical spine to include uncovertebral joint spurring. Moderate to severe right-sided neural foraminal narrowing and mild left-sided neural from narrowing at the C5-6 level. Atlantoaxial articulation is normal. Prevertebral soft tissues are normal. 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 Moderate to severe right-sided neural foraminal narrowing at the C5-6 level and mild left-sided neural foraminal narrowing at this level. Electronically Signed   By: Toribio Agreste M.D.   On: 02/01/2024 10:46     Procedures   Medications Ordered in the ED  ketorolac  (TORADOL ) 15 MG/ML injection 15 mg (15 mg Intravenous Given 02/01/24 1308)  dexamethasone  (DECADRON ) injection 10 mg (10 mg Intravenous Given 02/01/24 1307)  diazepam (VALIUM) injection 5 mg (5 mg Intravenous Given 02/01/24 1320)  ipratropium-albuterol  (DUONEB) 0.5-2.5 (3) MG/3ML nebulizer solution 3 mL (3 mLs Nebulization Given 02/01/24 1451)                                    Medical Decision Making Patient is doing well at this time and is stable for discharge home.  Discussed with patient symptoms are most consistent with cervical radiculopathy.  Will recommend close follow-up with primary care doctor and neurosurgery.  Remaining workup has been unremarkable.  Do not suspect ACS at this time.  EKG had no acute ischemic changes and she has negative troponin.  Low suspicion for pulmonary embolus in this patient.  She did have some mild wheezing on exam which has improved.  Patient had no acute findings on chest x-ray as well with no  indication for pneumonia, pneumothorax, hemothorax.  Given her increased headaches MRI was obtained which demonstrated no indication for CVA or TIA.  No space-occupying lesions were noted.  Patient has no concerning neurological deficits on exam.  The importance of close follow-up on outpatient basis was discussed as well as strict turn precautions for any new or worsening symptoms.  Patient voiced understanding and had no additional questions.  Amount and/or Complexity of Data Reviewed Labs: ordered. Radiology: ordered.  Risk Prescription drug management.        Final diagnoses:  Cervical radiculopathy    ED Discharge Orders          Ordered    naproxen (NAPROSYN) 375 MG tablet  2 times daily        02/01/24 1529    methocarbamol (ROBAXIN) 750 MG tablet  3 times daily        02/01/24 1529               Daralene Lonni BIRCH, PA-C 02/01/24 1532    Freddi Hamilton, MD 02/03/24 [phone]

## 2024-02-01 NOTE — Discharge Instructions (Signed)
 Please follow-up closely with your primary care doctor and with neurosurgery on an outpatient basis.  Return to emergency department immediately for any new or worsening symptoms.

## 2024-02-04 DIAGNOSIS — M5412 Radiculopathy, cervical region: Secondary | ICD-10-CM | POA: Diagnosis not present

## 2024-02-05 ENCOUNTER — Telehealth (HOSPITAL_COMMUNITY): Admitting: Psychiatry

## 2024-02-05 ENCOUNTER — Encounter (HOSPITAL_COMMUNITY): Payer: Self-pay | Admitting: Psychiatry

## 2024-02-05 DIAGNOSIS — F411 Generalized anxiety disorder: Secondary | ICD-10-CM | POA: Diagnosis not present

## 2024-02-05 DIAGNOSIS — F321 Major depressive disorder, single episode, moderate: Secondary | ICD-10-CM

## 2024-02-05 MED ORDER — FLUOXETINE HCL 20 MG PO CAPS: 20.0000 mg | ORAL_CAPSULE | Freq: Every day | ORAL | 1 refills | Status: DC

## 2024-02-05 MED ORDER — ALPRAZOLAM 1 MG PO TABS: 1.0000 mg | ORAL_TABLET | Freq: Three times a day (TID) | ORAL | 2 refills | Status: DC

## 2024-02-05 MED ORDER — ARIPIPRAZOLE 2 MG PO TABS: 2.0000 mg | ORAL_TABLET | Freq: Every morning | ORAL | 1 refills | Status: DC

## 2024-02-05 MED ORDER — ZOLPIDEM TARTRATE 10 MG PO TABS: 10.0000 mg | ORAL_TABLET | Freq: Every evening | ORAL | 2 refills | Status: DC | PRN

## 2024-02-05 NOTE — Progress Notes (Signed)
 Virtual Visit via Telephone Note  I connected with Virginia Trujillo on 02/05/24 at  9:00 AM EDT by telephone and verified that I am speaking with the correct person using two identifiers.  Location: Patient: home Provider: office   I discussed the limitations, risks, security and privacy concerns of performing an evaluation and management service by telephone and the availability of in person appointments. I also discussed with the patient that there may be a patient responsible charge related to this service. The patient expressed understanding and agreed to proceed.       I discussed the assessment and treatment plan with the patient. The patient was provided an opportunity to ask questions and all were answered. The patient agreed with the plan and demonstrated an understanding of the instructions.   The patient was advised to call back or seek an in-person evaluation if the symptoms worsen or if the condition fails to improve as anticipated.  I provided 20 minutes of non-face-to-face time during this encounter.   Barnie Gull, MD  [facility] MD/PA/NP OP Progress Note  02/05/2024 9:22 AM Virginia Trujillo  MRN:  [id number]  Chief Complaint:  Chief Complaint  Patient presents with   Anxiety   Depression   Follow-up   HPI: Patient is a 58 year old married black female lives with her husband in [place].  She is unemployed.  The patient returns for follow-up after 3 months regarding her major depression and generalized anxiety disorder.  Her main concern right now is that she is having severe neck pain and it is going down her right arm and causing swelling in the arm.  She was seen in the emergency room and had an MRI of her head and neck which did show some stenosis and degenerative changes.  She saw an orthopedic specialist yesterday and she is slated to have injections in her neck.  She states that the pain has been getting her down but she is not suicidal or having any thoughts of  self-harm.  Her sleep is somewhat interrupted by pain as well.  She is hoping that the injections will help.  She did not denies any serious depression anxiety.  She still feels that her medications are helpful Visit Diagnosis:    ICD-10-CM   1. Generalized anxiety disorder  F41.1     2. 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 episode (HCC)  F32.1       Past Psychiatric History: none  Past Medical History:  Past Medical History:  Diagnosis Date   Acid reflux    Anxiety    Asthma    Depression    Fibromyalgia    Sleep apnea     Past Surgical History:  Procedure Laterality Date   CESAREAN SECTION     CHOLECYSTECTOMY N/A 08/14/2021   Procedure: LAPAROSCOPIC CHOLECYSTECTOMY;  Surgeon: Evonnie Dorothyann LABOR, DO;  Location: [facility];  Service: General;  Laterality: N/A;   MOUTH SURGERY     TUBAL LIGATION     tubes tied      Family Psychiatric History: See below  Family History:  Family History  Problem Relation Age of Onset   Hypertension Father    Hypertension Brother    Diabetes Brother    Depression Brother    Anxiety disorder Brother    Stroke Maternal Grandmother    Stroke Paternal Grandfather    Congestive Heart Failure Sister    Diabetes Sister    Hypertension Sister    Anxiety disorder Sister  Depression Sister    Heart disease Unknown    Arthritis Unknown    Cancer Unknown    Asthma Unknown    Diabetes Unknown    Kidney disease Unknown     Social History:  Social History   Socioeconomic History   Marital status: Married    Spouse name: Not on file   Number of children: 2   Years of education: 12   Highest education level: Not on file  Occupational History   Occupation: none    Employer: UNEMPLOYED   Occupation: Unemployed   Tobacco Use   Smoking status: Never    Passive exposure: Never   Smokeless tobacco: Never  Vaping Use   Vaping status: Never Used  Substance and Sexual Activity   Alcohol use: No   Drug  use: No   Sexual activity: Yes    Birth control/protection: Surgical  Other Topics Concern   Not on file  Social History Narrative   Patient lives at home with husband and son.    Patient does not work   Patient has a high school education    Patient has 2 children.   Social Drivers of Corporate investment banker Strain: Low Risk  (12/01/2023)   Received from [facility]   Overall Financial Resource Strain (CARDIA)    How hard is it for you to pay for the very basics like food, housing, medical care, and heating?: Not hard at all  Food Insecurity: No Food Insecurity (12/01/2023)   Received from [facility]   Hunger Vital Sign    Within the past 12 months, you worried that your food would run out before you got the money to buy more.: Never true    Within the past 12 months, the food you bought just didn't last and you didn't have money to get more.: Never true  Transportation Needs: No Transportation Needs (12/01/2023)   Received from [facility] - Transportation    In the past 12 months, has lack of transportation kept you from medical appointments or from getting medications?: No    In the past 12 months, has lack of transportation kept you from meetings, work, or from getting things needed for daily living?: No  Physical Activity: Not on file  Stress: Not on file  Social Connections: Not on file    Allergies:  Allergies  Allergen Reactions   Prednisone Other (See Comments)    Depression and crying   Penicillins     Metabolic Disorder Labs: No results found for: HGBA1C, MPG No results found for: PROLACTIN No results found for: CHOL, TRIG, HDL, CHOLHDL, VLDL, LDLCALC Lab Results  Component Value Date   TSH 3.190 11/29/2013    Therapeutic Level Labs: No results found for: LITHIUM No results found for: VALPROATE No results found for: CBMZ  Current Medications: Current Outpatient Medications  Medication Sig Dispense Refill    gabapentin (NEURONTIN) 300 MG capsule Take 300 mg by mouth.     albuterol  (PROVENTIL ) (2.5 MG/3ML) 0.083% nebulizer solution Take 2.5 mg by nebulization every 6 (six) hours as needed for wheezing or shortness of breath.     albuterol  (VENTOLIN  HFA) 108 (90 Base) MCG/ACT inhaler Inhale 2 puffs into the lungs every 4 (four) hours as needed for wheezing or shortness of breath.     ALPRAZolam  (XANAX ) 1 MG tablet Take 1 tablet (1 mg total) by mouth 3 (three) times daily. 90 tablet 2   ARIPiprazole  (ABILIFY ) 2 MG tablet  Take 1 tablet (2 mg total) by mouth every morning. 90 tablet 1   benzonatate  (TESSALON ) 200 MG capsule Take 1 capsule (200 mg total) by mouth 3 (three) times daily as needed. 21 capsule 0   FLUoxetine  (PROZAC ) 20 MG capsule Take 1 capsule (20 mg total) by mouth daily. 90 capsule 1   methocarbamol (ROBAXIN) 750 MG tablet Take 1 tablet (750 mg total) by mouth 3 (three) times daily. 21 tablet 0   montelukast (SINGULAIR) 10 MG tablet Take 10 mg by mouth daily.     naproxen (NAPROSYN) 375 MG tablet Take 1 tablet (375 mg total) by mouth 2 (two) times daily. 20 tablet 0   oseltamivir  (TAMIFLU ) 75 MG capsule Take 1 capsule (75 mg total) by mouth every 12 (twelve) hours. 10 capsule 0   pantoprazole  (PROTONIX ) 40 MG tablet Take 40 mg by mouth daily.     zolpidem  (AMBIEN ) 10 MG tablet Take 1 tablet (10 mg total) by mouth at bedtime as needed for sleep. 30 tablet 2   No current facility-administered medications for this visit.     Musculoskeletal: Strength & Muscle Tone: na Gait & Station: na Patient leans: na  Psychiatric Specialty Exam: Review of Systems  Musculoskeletal:  Positive for arthralgias, myalgias and neck pain.  All other systems reviewed and are negative.   There were no vitals taken for this visit.There is no height or weight on file to calculate BMI.  General Appearance: NA  Eye Contact:  NA  Speech:  Clear and Coherent  Volume:  Normal  Mood:  Euthymic  Affect:  NA   Thought Process:  Goal Directed  Orientation:  Full (Time, Place, and Person)  Thought Content: Rumination   Suicidal Thoughts:  No  Homicidal Thoughts:  No  Memory:  Immediate;   Good Recent;   Good Remote;   NA  Judgement:  Good  Insight:  Fair  Psychomotor Activity:  Decreased  Concentration:  Concentration: Good and Attention Span: Good  Recall:  Good  Fund of Knowledge: Good  Language: Good  Akathisia:  No  Handed:  Right  AIMS (if indicated): not done  Assets:  Communication Skills Desire for Improvement Resilience Social Support  ADL's:  Intact  Cognition: WNL  Sleep:  Fair   Screenings: GAD-7    Advertising copywriter from 12/28/2023 in [facility] [place] Office Visit from 11/05/2023 in [facility] [place] Counselor from 08/21/2023 in [facility] at [place] Counselor from 04/20/2023 in [facility] [place] Counselor from 04/06/2023 in [facility] at [place]  Total GAD-7 Score 18 12 20 17 21    PHQ2-9    Flowsheet Row Office Visit from 11/05/2023 in [facility] [place] Counselor from 04/06/2023 in [facility] at [place] Counselor from 08/08/2022 in [facility] at [place] Counselor from 07/01/2022 in [facility] at [place] Video Visit from 12/16/2021 in [facility] at [facility] Total Score 4 5 4 5 1   PHQ-9 Total Score 10 17 14 23  --   Flowsheet Row ED from 02/01/2024 in [facility] Emergency Department at [facility] ED from 06/08/2023 in [facility] Emergency Department at [facility] ED from 05/30/2023 in [facility] Department at [facility]  C-SSRS RISK CATEGORY No Risk No Risk No Risk  Assessment and Plan:  This patient is a 58 year old female with a history of depression and anxiety.  She does feel that her medications are helpful for her symptoms.  She will continue Prozac  20 mg daily for depression, Abilify  2 mg daily for augmentation of the antidepressant, Xanax  1 mg up to 3 times daily as needed for anxiety and Ambien  10 mg at bedtime as needed for sleep.  She will return to see me in 3 months  Collaboration of Care: Collaboration of Care: Referral or follow-up with counselor/therapist AEB patient will continue therapy with Winton Rubinstein in our office  Patient/Guardian was advised Release of Information must be obtained prior to any record release in order to collaborate their care with an outside provider. Patient/Guardian was advised if they have not already done so to contact the registration department to sign all necessary forms in order for us  to release information regarding their care.   Consent: Patient/Guardian gives verbal consent for treatment and assignment of benefits for services provided during this visit. Patient/Guardian expressed understanding and agreed to proceed.    Barnie Gull, MD 02/05/2024, 9:22 AM

## 2024-02-15 ENCOUNTER — Ambulatory Visit (INDEPENDENT_AMBULATORY_CARE_PROVIDER_SITE_OTHER): Admitting: Psychiatry

## 2024-02-15 DIAGNOSIS — F411 Generalized anxiety disorder: Secondary | ICD-10-CM | POA: Diagnosis not present

## 2024-02-15 NOTE — Progress Notes (Signed)
 Virtual Visit via Telephone Note  I connected with Virginia Trujillo on 02/15/24 at 9:08 AM EST by telephone and verified that I am speaking with the correct person using two identifiers.  Location: Patient: Home Provider: Home office   I discussed the limitations, risks, security and privacy concerns of performing an evaluation and management service by telephone and the availability of in person appointments. I also discussed with the patient that there may be a patient responsible charge related to this service. The patient expressed understanding and agreed to proceed.    I provided  57 minutes of non-face-to-face time during this encounter.   Winton FORBES Rubinstein, LCSW                              THERAPIST PROGRESS NOTE     Session Time: Monday 02/15/2024 10:10 AM - 10:51 AM  Participation Level: Active  Behavioral Response: talkative  Type of Therapy: Individual Therapy  Treatment Goals addressed: Virginia Trujillo will score less than 5 on the Generalized Anxiety Disorder 7 Scale (GAD-7)  Pt will practice relaxation techniques daily    Progress on Goals:  progressing   Interventions: CBT and Supportive  Summary: Virginia Trujillo is a 58 y.o. female who presents with symptoms of anxiety that have been lilfelong per patient's report but symptoms worsened when her mother died 15+ years ago. Also during the same 2 month period around her mother's death,her grandparents and her sister died. .Patient reports seeing a psyhiatrist once in early adolescence due to isolative behaviors and anxiety. She is seeing psychiatrist Dr. Okey. Patient reports no psychiatric hospitalizations. She has been seen intermittently by this clinician for several years.  Patient's reports still experiencing depression and anxiety is constantly worrying.  She is particularly stressed now about her youngest son who has legal issues.  She fears he may go to jail.  She also recently learned he is going to have another  baby.  She also has what if thoughts about various other issues especially about her father dying.     Patient last was seen about 6 weeks ago.  Patient reports experiencing increased pain resulting in patient recently going to the ED.  Per her report, she was diagnosed with a pinched nerve.  She is scheduled to have an injection this week.  She reports injections in the neck have not worked in the past.  She reports she may have to have surgery if the injection does not provide any relief.  She reports sometimes becoming down especially at night when she is more aware of the pain.  She reports often overdoing it during the day as she pressure self to accomplish projects.  She reports continued strong support from family and friends and continues to enjoy socialization.  She continues to enjoy taking care of her grandchildren.  She is looking forward to her son being released from prison on March 08, 2024.  Patient reports doing well overall and continuing to use helpful coping strategies to manage anxiety including deep breathing and distracting activities. D     Suicidal/Homicidal: None        Therapist Response: reviewed symptoms, discussed stressors, facilitated expression of thoughts and feelings, validated feelings, praised and reinforced pt's use of behavioral activation and distracting activities, assisted patient identify/challenge/and replace thoughts evoking depression when experiencing pain with more helpful thoughts, assisted patient identify realistic expectations of self, also assisted patient identify ways to pace  self, processed patient's feelings about stepdown plan to termination to include 4 more sessions focusing on relapse prevention strategies   Plan: Return again in 2 weeks  Diagnosis: Axis I: Generalized Anxiety Disorder      Axis II: No diagnosis Collaboration of Care: Psychiatrist AEB patient sees psychiatrist Dr. Okey in this practice for medication  management  Patient/Guardian was advised Release of Information must be obtained prior to any record release in order to collaborate their care with an outside provider. Patient/Guardian was advised if they have not already done so to contact the registration department to sign all necessary forms in order for us  to release information regarding their care.   Consent: Patient/Guardian gives verbal consent for treatment and assignment of benefits for services provided during this visit. Patient/Guardian expressed understanding and agreed to proceed.    Winton FORBES Rubinstein, LCSW 02/15/2024

## 2024-02-17 DIAGNOSIS — M79621 Pain in right upper arm: Secondary | ICD-10-CM | POA: Diagnosis not present

## 2024-02-17 DIAGNOSIS — M5412 Radiculopathy, cervical region: Secondary | ICD-10-CM | POA: Diagnosis not present

## 2024-03-01 DIAGNOSIS — M7551 Bursitis of right shoulder: Secondary | ICD-10-CM | POA: Diagnosis not present

## 2024-03-01 DIAGNOSIS — M25511 Pain in right shoulder: Secondary | ICD-10-CM | POA: Diagnosis not present

## 2024-03-01 DIAGNOSIS — G8929 Other chronic pain: Secondary | ICD-10-CM | POA: Diagnosis not present

## 2024-03-04 ENCOUNTER — Ambulatory Visit (INDEPENDENT_AMBULATORY_CARE_PROVIDER_SITE_OTHER): Admitting: Psychiatry

## 2024-03-04 DIAGNOSIS — F411 Generalized anxiety disorder: Secondary | ICD-10-CM | POA: Diagnosis not present

## 2024-03-04 NOTE — Progress Notes (Signed)
 Virtual Visit via Telephone Note  I connected with Virginia Trujillo on 03/04/24 at 9:10 AM EST by telephone and verified that I am speaking with the correct person using two identifiers.  Location: Patient: Home Provider: Home office   I discussed the limitations, risks, security and privacy concerns of performing an evaluation and management service by telephone and the availability of in person appointments. I also discussed with the patient that there may be a patient responsible charge related to this service. The patient expressed understanding and agreed to proceed.    I provided 19  minutes of non-face-to-face time during this encounter.   Winton FORBES Rubinstein, LCSW                              THERAPIST PROGRESS NOTE     Session Time:  Friday 03/04/2024 9:10 AM -  9:29 AM  Participation Level: Active  Behavioral Response: talkative  Type of Therapy: Individual Therapy  Treatment Goals addressed: Virginia Trujillo will score less than 5 on the Generalized Anxiety Disorder 7 Scale (GAD-7)  Pt will practice relaxation techniques daily    Progress on Goals:  progressing   Interventions: CBT and Supportive  Summary: Virginia Trujillo is a 58 y.o. female who presents with symptoms of anxiety that have been lilfelong per patient's report but symptoms worsened when her mother died 15+ years ago. Also during the same 2 month period around her mother's death,her grandparents and her sister died. .Patient reports seeing a psyhiatrist once in early adolescence due to isolative behaviors and anxiety. She is seeing psychiatrist Dr. Okey. Patient reports no psychiatric hospitalizations. She has been seen intermittently by this clinician for several years.  Patient's reports still experiencing depression and anxiety is constantly worrying.  She is particularly stressed now about her youngest son who has legal issues.  She fears he may go to jail.  She also recently learned he is going to have another  baby.  She also has what if thoughts about various other issues especially about her father dying.     Patient last was seen about 2 weeks ago.  Patient reports relief from pain as she had an injection this past Tuesday.  However, she then became sick with the flu.  She reports feeling a little down and experiencing anxiety because she cannot do tasks she had planned.  She remains excited about her son being released from prison on November 4.  She is looking forward to having a celebration with him and other family members once he returns home.        Suicidal/Homicidal: None        Therapist Response: reviewed symptoms, discussed stressors, facilitated expression of thoughts and feelings, validated feelings, assisted patient identify ways to cope with anxiety including coping statements and distracting activities, developed plan with patient to implement strategies discussed in session, therapist and patient agreed to end session early as patient is not feeling well.    Plan: Return again in 2 weeks  Diagnosis: Axis I: Generalized Anxiety Disorder      Axis II: No diagnosis Collaboration of Care: Psychiatrist AEB patient sees psychiatrist Dr. Okey in this practice for medication management  Patient/Guardian was advised Release of Information must be obtained prior to any record release in order to collaborate their care with an outside provider. Patient/Guardian was advised if they have not already done so to contact the registration department to sign all  necessary forms in order for us  to release information regarding their care.   Consent: Patient/Guardian gives verbal consent for treatment and assignment of benefits for services provided during this visit. Patient/Guardian expressed understanding and agreed to proceed.    Winton FORBES Rubinstein, LCSW 03/04/2024

## 2024-03-16 DIAGNOSIS — J019 Acute sinusitis, unspecified: Secondary | ICD-10-CM | POA: Diagnosis not present

## 2024-03-16 DIAGNOSIS — E559 Vitamin D deficiency, unspecified: Secondary | ICD-10-CM | POA: Diagnosis not present

## 2024-03-16 DIAGNOSIS — R7309 Other abnormal glucose: Secondary | ICD-10-CM | POA: Diagnosis not present

## 2024-03-16 DIAGNOSIS — E785 Hyperlipidemia, unspecified: Secondary | ICD-10-CM | POA: Diagnosis not present

## 2024-03-16 DIAGNOSIS — M797 Fibromyalgia: Secondary | ICD-10-CM | POA: Diagnosis not present

## 2024-03-16 DIAGNOSIS — E538 Deficiency of other specified B group vitamins: Secondary | ICD-10-CM | POA: Diagnosis not present

## 2024-03-18 ENCOUNTER — Ambulatory Visit (HOSPITAL_COMMUNITY): Admitting: Psychiatry

## 2024-03-18 DIAGNOSIS — E538 Deficiency of other specified B group vitamins: Secondary | ICD-10-CM | POA: Diagnosis not present

## 2024-04-04 ENCOUNTER — Ambulatory Visit (INDEPENDENT_AMBULATORY_CARE_PROVIDER_SITE_OTHER): Admitting: Psychiatry

## 2024-04-04 DIAGNOSIS — F411 Generalized anxiety disorder: Secondary | ICD-10-CM | POA: Diagnosis not present

## 2024-04-04 NOTE — Progress Notes (Signed)
 Virtual Visit via Telephone Note  I connected with Virginia Trujillo on 04/04/24 at 9:02 AM EST by telephone and verified that I am speaking with the correct person using two identifiers.  Location: Patient: Home Provider: Home office   I discussed the limitations, risks, security and privacy concerns of performing an evaluation and management service by telephone and the availability of in person appointments. I also discussed with the patient that there may be a patient responsible charge related to this service. The patient expressed understanding and agreed to proceed.    I provided  28 minutes of non-face-to-face time during this encounter.   Winton FORBES Rubinstein, LCSW                              THERAPIST PROGRESS NOTE     Session Time:  Monday 04/04/2024 9:02 AM -9:30 AM  Participation Level: Active  Behavioral Response: talkative/anxious,depressed  Type of Therapy: Individual Therapy  Treatment Goals addressed: Latalia will score less than 5 on the Generalized Anxiety Disorder 7 Scale (GAD-7)  Pt will practice relaxation techniques daily    Progress on Goals:  progressing   Interventions: CBT and Supportive  Summary: Virginia Trujillo is a 58 y.o. female who presents with symptoms of anxiety that have been lilfelong per patient's report but symptoms worsened when her mother died 15+ years ago. Also during the same 2 month period around her mother's death,her grandparents and her sister died. .Patient reports seeing a psyhiatrist once in early adolescence due to isolative behaviors and anxiety. She is seeing psychiatrist Dr. Okey. Patient reports no psychiatric hospitalizations. She has been seen intermittently by this clinician for several years.  Patient's reports still experiencing depression and anxiety is constantly worrying.  She is particularly stressed now about her youngest son who has legal issues.  She fears he may go to jail.  She also recently learned he is going to  have another baby.  She also has what if thoughts about various other issues especially about her father dying.     Patient last was seen about 4 weeks ago.  Patient reports increased stress, depressed mood, and anxiety in the past week. She reports multiple stressors including continued shoulder pain. Per her report, she discontinued PT as it increased her pain. She is scheduled for follow up appt with her doctor next month. She is particularly stressed this morning regarding concerns about her father. Per herr report, her father was in the hospital on Thanksgiving day but has been discharged home.  She just received a call this morning from her sister indicating father is not doing well.  She states she does not know exactly what this means but plans to go see her father today to gather more information.  Patient reports she has been using deep breathing and distracting activities to try to avoid what if thoughts.  She is pleased her son has returned home and states he is doing very well.   Suicidal/Homicidal: None        Therapist Response: reviewed symptoms, discussed stressors, facilitated expression of thoughts and feelings, validated feelings, encouraged patient to continue using deep breathing and distracting activities, also reviewed mindfulness activity using breath awareness as well as grounding techniques to help cope with stress and anxiety ly as patient is not feeling well.    Plan: Return again in 2 weeks  Diagnosis: Axis I: Generalized Anxiety Disorder  Axis II: No diagnosis Collaboration of Care: Psychiatrist AEB patient sees psychiatrist Dr. Okey in this practice for medication management  Patient/Guardian was advised Release of Information must be obtained prior to any record release in order to collaborate their care with an outside provider. Patient/Guardian was advised if they have not already done so to contact the registration department to sign all necessary forms in order  for us  to release information regarding their care.   Consent: Patient/Guardian gives verbal consent for treatment and assignment of benefits for services provided during this visit. Patient/Guardian expressed understanding and agreed to proceed.    Winton FORBES Rubinstein, LCSW 04/04/2024

## 2024-04-18 DIAGNOSIS — E538 Deficiency of other specified B group vitamins: Secondary | ICD-10-CM | POA: Diagnosis not present

## 2024-04-25 ENCOUNTER — Ambulatory Visit (HOSPITAL_COMMUNITY): Admitting: Psychiatry

## 2024-04-25 DIAGNOSIS — F411 Generalized anxiety disorder: Secondary | ICD-10-CM | POA: Diagnosis not present

## 2024-04-25 NOTE — Progress Notes (Signed)
 "  Virtual Visit via Telephone Note  I connected with Virginia Trujillo on 04/25/2024 at 9:10 AM EST by telephone and verified that I am speaking with the correct person using two identifiers.  Location: Patient: Home Provider: Home office   I discussed the limitations, risks, security and privacy concerns of performing an evaluation and management service by telephone and the availability of in person appointments. I also discussed with the patient that there may be a patient responsible charge related to this service. The patient expressed understanding and agreed to proceed.    I provided  39 minutes of non-face-to-face time during this encounter.   Winton FORBES Rubinstein, LCSW                              THERAPIST PROGRESS NOTE     Session Time:  Monday 04/25/2024 9:10 AM  - 9:49 AM  Participation Level: Active  Behavioral Response: talkative/anxious,depressed  Type of Therapy: Individual Therapy  Treatment Goals addressed: Chapel will score less than 5 on the Generalized Anxiety Disorder 7 Scale (GAD-7)  Pt will practice relaxation techniques daily    Progress on Goals:  progressing   Interventions: CBT and Supportive  Summary: Virginia Trujillo is a 58 y.o. female who presents with symptoms of anxiety that have been lilfelong per patient's report but symptoms worsened when her mother died 15+ years ago. Also during the same 2 month period around her mother's death,her grandparents and her sister died. .Patient reports seeing a psyhiatrist once in early adolescence due to isolative behaviors and anxiety. She is seeing psychiatrist Dr. Okey. Patient reports no psychiatric hospitalizations. She has been seen intermittently by this clinician for several years.  Patient's reports still experiencing depression and anxiety is constantly worrying.  She is particularly stressed now about her youngest son who has legal issues.  She fears he may go to jail.  She also recently learned he is going  to have another baby.  She also has what if thoughts about various other issues especially about her father dying.     Patient last was seen about 2 weeks ago.  Patient reports improved mood and decreased worry since last session.  Patient reports father is doing very well.  She is looking forward to celebrating the holidays with him and the rest of her family.  However, patient reports increased sadness triggered by the recent death of her father's neighbor.  This also triggered increased memories of her deceased family members including mother, sister, and brother.  Patient reports recently feeling overwhelmed by these thoughts, using deep breathing, and then taking a Xanax  to try to manage as she was starting to hyperventilate.  She then became involved in distracting activities such as working on her crafts and being with her grandchildren.  Patient reports additional stress related to experiencing sleep difficulty due to waking up with muscle spasms and pain.  She expresses frustration as she is fatigued.  She continues to take B12 injections.  Patient is maintaining involvement in activities including taking care of her grandchildren, attending church, and doing crafts.  Suicidal/Homicidal: None        Therapist Response: reviewed symptoms, discussed stressors, facilitated expression of thoughts and feelings, validated feelings, normalized feelings related to grief and loss, discussed integrated grief, praised and reinforced patient's use of healthy coping strategies, encouraged patient to continue maintaining behavioral activation and use of her support system   Plan: Return  again in 2 weeks  Diagnosis: Axis I: Generalized Anxiety Disorder      Axis II: No diagnosis Collaboration of Care: Psychiatrist AEB patient sees psychiatrist Dr. Okey in this practice for medication management  Patient/Guardian was advised Release of Information must be obtained prior to any record release in order to  collaborate their care with an outside provider. Patient/Guardian was advised if they have not already done so to contact the registration department to sign all necessary forms in order for us  to release information regarding their care.   Consent: Patient/Guardian gives verbal consent for treatment and assignment of benefits for services provided during this visit. Patient/Guardian expressed understanding and agreed to proceed.    Winton FORBES Rubinstein, LCSW 04/25/2024    "

## 2024-05-06 ENCOUNTER — Other Ambulatory Visit (HOSPITAL_COMMUNITY): Payer: Self-pay | Admitting: Psychiatry

## 2024-05-09 ENCOUNTER — Encounter (HOSPITAL_COMMUNITY): Payer: Self-pay | Admitting: Psychiatry

## 2024-05-09 ENCOUNTER — Telehealth (HOSPITAL_COMMUNITY): Admitting: Psychiatry

## 2024-05-09 DIAGNOSIS — F321 Major depressive disorder, single episode, moderate: Secondary | ICD-10-CM | POA: Diagnosis not present

## 2024-05-09 DIAGNOSIS — F411 Generalized anxiety disorder: Secondary | ICD-10-CM

## 2024-05-09 MED ORDER — FLUOXETINE HCL 20 MG PO CAPS: 20.0000 mg | ORAL_CAPSULE | Freq: Every day | ORAL | 1 refills | Status: AC

## 2024-05-09 MED ORDER — ARIPIPRAZOLE 2 MG PO TABS: 2.0000 mg | ORAL_TABLET | Freq: Every morning | ORAL | 1 refills | Status: AC

## 2024-05-09 MED ORDER — ZOLPIDEM TARTRATE 10 MG PO TABS: 10.0000 mg | ORAL_TABLET | Freq: Every evening | ORAL | 2 refills | Status: AC | PRN

## 2024-05-09 MED ORDER — ALPRAZOLAM 1 MG PO TABS: 1.0000 mg | ORAL_TABLET | Freq: Three times a day (TID) | ORAL | 2 refills | Status: AC

## 2024-05-09 NOTE — Progress Notes (Signed)
 Virtual Visit via Telephone Note  I connected with Virginia Trujillo on 05/09/2024 at  9:20 AM EST by telephone and verified that I am speaking with the correct person using two identifiers.  Location: Patient: home Provider: office   I discussed the limitations, risks, security and privacy concerns of performing an evaluation and management service by telephone and the availability of in person appointments. I also discussed with the patient that there may be a patient responsible charge related to this service. The patient expressed understanding and agreed to proceed.       I discussed the assessment and treatment plan with the patient. The patient was provided an opportunity to ask questions and all were answered. The patient agreed with the plan and demonstrated an understanding of the instructions.   The patient was advised to call back or seek an in-person evaluation if the symptoms worsen or if the condition fails to improve as anticipated.  I provided 20 minutes of non-face-to-face time during this encounter.   Barnie Gull, MD  [facility] MD/PA/NP OP Progress Note  05/09/2024 9:30 AM Virginia Trujillo  MRN:  [id number]  Chief Complaint:  Chief Complaint  Patient presents with   Anxiety   Depression   Follow-up   HPI: This patient is a 59 year old married black female who lives with her husband in [place].  She is unemployed.  The patient returns for follow-up after 3 months regarding her major depression and generalized anxiety disorder.  She states overall she is doing okay.  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  This is frustrating to her.  Nevertheless she states that her mood is stable and she denies significant depression thoughts of self-harm or suicide.  She is sleeping fairly well.  She still feels that her medications have been helpful for her mood. Visit Diagnosis:    ICD-10-CM   1. Generalized anxiety disorder  F41.1     2. Current  moderate episode of major depressive disorder without prior episode (HCC)  F32.1       Past Psychiatric History: none  Past Medical History:  Past Medical History:  Diagnosis Date   Acid reflux    Anxiety    Asthma    Depression    Fibromyalgia    Sleep apnea     Past Surgical History:  Procedure Laterality Date   CESAREAN SECTION     CHOLECYSTECTOMY N/A 08/14/2021   Procedure: LAPAROSCOPIC CHOLECYSTECTOMY;  Surgeon: Evonnie Dorothyann LABOR, DO;  Location: [facility];  Service: General;  Laterality: N/A;   MOUTH SURGERY     TUBAL LIGATION     tubes tied      Family Psychiatric History: See below  Family History:  Family History  Problem Relation Age of Onset   Hypertension Father    Hypertension Brother    Diabetes Brother    Depression Brother    Anxiety disorder Brother    Stroke Maternal Grandmother    Stroke Paternal Grandfather    Congestive Heart Failure Sister    Diabetes Sister    Hypertension Sister    Anxiety disorder Sister    Depression Sister    Heart disease Unknown    Arthritis Unknown    Cancer Unknown    Asthma Unknown    Diabetes Unknown    Kidney disease Unknown     Social History:  Social History   Socioeconomic History   Marital status: Married    Spouse  name: Not on file   Number of children: 2   Years of education: 53   Highest education level: Not on file  Occupational History   Occupation: none    Employer: UNEMPLOYED   Occupation: Unemployed   Tobacco Use   Smoking status: Never    Passive exposure: Never   Smokeless tobacco: Never  Vaping Use   Vaping status: Never Used  Substance and Sexual Activity   Alcohol use: No   Drug use: No   Sexual activity: Yes    Birth control/protection: Surgical  Other Topics Concern   Not on file  Social History Narrative   Patient lives at home with husband and son.    Patient does not work   Patient has a high school education    Patient has 2 children.   Social Drivers of Health    Tobacco Use: Low Risk (05/09/2024)   Patient History    Smoking Tobacco Use: Never    Smokeless Tobacco Use: Never    Passive Exposure: Never  Financial Resource Strain: Low Risk (12/01/2023)   Received from [facility]   Overall Financial Resource Strain (CARDIA)    How hard is it for you to pay for the very basics like food, housing, medical care, and heating?: Not hard at all  Food Insecurity: No Food Insecurity (12/01/2023)   Received from [facility]   Epic    Within the past 12 months, you worried that your food would run out before you got the money to buy more.: Never true    Within the past 12 months, the food you bought just didn't last and you didn't have money to get more.: Never true  Transportation Needs: No Transportation Needs (12/01/2023)   Received from [facility]    In the past 12 months, has lack of transportation kept you from medical appointments or from getting medications?: No    In the past 12 months, has lack of transportation kept you from meetings, work, or from getting things needed for daily living?: No  Physical Activity: Not on file  Stress: Not on file  Social Connections: Not on file  Depression (PHQ2-9): Medium Risk (11/05/2023)   Depression (PHQ2-9)    PHQ-2 Score: 10  Alcohol Screen: Not on file  Housing: Low Risk (12/01/2023)   Received from [facility]    In the last 12 months, was there a time when you were not able to pay the mortgage or rent on time?: No    In the past 12 months, how many times have you moved where you were living?: 0    At any time in the past 12 months, were you homeless or living in a shelter (including now)?: No  Utilities: Not At Risk (12/01/2023)   Received from [facility]    In the past 12 months has the electric, gas, oil, or water company threatened to shut off services in your home?: No  Health Literacy: Not on file    Allergies: Allergies[1]  Metabolic Disorder Labs: No results  found for: HGBA1C, MPG No results found for: PROLACTIN No results found for: CHOL, TRIG, HDL, CHOLHDL, VLDL, LDLCALC Lab Results  Component Value Date   TSH 3.190 11/29/2013    Therapeutic Level Labs: No results found for: LITHIUM No results found for: VALPROATE No results found for: CBMZ  Current Medications: Current Outpatient Medications  Medication Sig Dispense Refill   albuterol  (PROVENTIL ) (2.5  MG/3ML) 0.083% nebulizer solution Take 2.5 mg by nebulization every 6 (six) hours as needed for wheezing or shortness of breath.     albuterol  (VENTOLIN  HFA) 108 (90 Base) MCG/ACT inhaler Inhale 2 puffs into the lungs every 4 (four) hours as needed for wheezing or shortness of breath.     ALPRAZolam  (XANAX ) 1 MG tablet Take 1 tablet (1 mg total) by mouth 3 (three) times daily. 90 tablet 2   ARIPiprazole  (ABILIFY ) 2 MG tablet Take 1 tablet (2 mg total) by mouth every morning. 90 tablet 1   benzonatate  (TESSALON ) 200 MG capsule Take 1 capsule (200 mg total) by mouth 3 (three) times daily as needed. 21 capsule 0   FLUoxetine  (PROZAC ) 20 MG capsule Take 1 capsule (20 mg total) by mouth daily. 90 capsule 1   methocarbamol  (ROBAXIN ) 750 MG tablet Take 1 tablet (750 mg total) by mouth 3 (three) times daily. 21 tablet 0   montelukast (SINGULAIR) 10 MG tablet Take 10 mg by mouth daily.     naproxen  (NAPROSYN ) 375 MG tablet Take 1 tablet (375 mg total) by mouth 2 (two) times daily. 20 tablet 0   pantoprazole  (PROTONIX ) 40 MG tablet Take 40 mg by mouth daily.     zolpidem  (AMBIEN ) 10 MG tablet Take 1 tablet (10 mg total) by mouth at bedtime as needed for sleep. 30 tablet 2   No current facility-administered medications for this visit.     Musculoskeletal: Strength & Muscle Tone: na Gait & Station: na Patient leans: N/A  Psychiatric Specialty Exam: Review of Systems  Musculoskeletal:  Positive for arthralgias, myalgias and neck pain.  All other systems reviewed and are  negative.   There were no vitals taken for this visit.There is no height or weight on file to calculate BMI.  General Appearance: NA  Eye Contact:  NA  Speech:  Clear and Coherent  Volume:  Normal  Mood:  Euthymic  Affect:  NA  Thought Process:  Goal Directed  Orientation:  Full (Time, Place, and Person)  Thought Content: WDL   Suicidal Thoughts:  No  Homicidal Thoughts:  No  Memory:  Immediate;   Good Recent;   Good Remote;   NA  Judgement:  Good  Insight:  Fair  Psychomotor Activity:  Normal  Concentration:  Concentration: Good and Attention Span: Good  Recall:  Good  Fund of Knowledge: Good  Language: Good  Akathisia:  No  Handed:  Right  AIMS (if indicated): not done  Assets:  Communication Skills Desire for Improvement Resilience Social Support  ADL's:  Intact  Cognition: WNL  Sleep:  Good   Screenings: GAD-7    Advertising Copywriter from 12/28/2023 in [facility] [place] Office Visit from 11/05/2023 in [facility] [place] Counselor from 08/21/2023 in [facility] [place] Counselor from 04/20/2023 in [facility] [place] Counselor from 04/06/2023 in [facility] at [place]  Total GAD-7 Score 18 12 20 17 21    PHQ2-9    Flowsheet Row Office Visit from 11/05/2023 in [facility] [place] Counselor from 04/06/2023 in [facility] at [place] Counselor from 08/08/2022 in [facility] at [place] Counselor from 07/01/2022 in [facility] [place] Video Visit from 12/16/2021 in [facility] at [facility] Total Score 4 5 4 5 1   PHQ-9 Total  Score 10 17 14 23  --   Flowsheet Row ED from 02/01/2024 in [facility] Emergency Department at [facility] ED from  06/08/2023 in [facility] Emergency Department at [facility] ED from 05/30/2023 in [facility] Department at [facility]  C-SSRS RISK CATEGORY No Risk No Risk No Risk     Assessment and Plan: This patient is a 59 year old female with a history of major depression and generalized anxiety.  She feels that her medications are still helpful for her symptoms.  Therefore she will continue Prozac  20 mg daily for major depression, Abilify  2 mg daily for antidepressant augmentation, Xanax  1 mg up to 3 times daily as needed for anxiety and Ambien  10 mg at bedtime as needed for sleep.  She will return to see me in 3 months  Collaboration of Care: Collaboration of Care: Referral or follow-up with counselor/therapist AEB patient will continue therapy with Winton Rubinstein in our office  Patient/Guardian was advised Release of Information must be obtained prior to any record release in order to collaborate their care with an outside provider. Patient/Guardian was advised if they have not already done so to contact the registration department to sign all necessary forms in order for us  to release information regarding their care.   Consent: Patient/Guardian gives verbal consent for treatment and assignment of benefits for services provided during this visit. Patient/Guardian expressed understanding and agreed to proceed.    Barnie Gull, MD 05/09/2024, 9:30 AM     [1]  Allergies Allergen Reactions   Prednisone Other (See Comments)    Depression and crying   Penicillins

## 2024-05-16 ENCOUNTER — Ambulatory Visit (INDEPENDENT_AMBULATORY_CARE_PROVIDER_SITE_OTHER): Admitting: Psychiatry

## 2024-05-16 DIAGNOSIS — F411 Generalized anxiety disorder: Secondary | ICD-10-CM

## 2024-05-16 NOTE — Progress Notes (Signed)
 "  Virtual Visit via Telephone Note  I connected with Virginia Trujillo on 05/16/2024 at 8:10 AM EST by telephone and verified that I am speaking with the correct person using two identifiers.  Location: Patient: Home Provider: Home office   I discussed the limitations, risks, security and privacy concerns of performing an evaluation and management service by telephone and the availability of in person appointments. I also discussed with the patient that there may be a patient responsible charge related to this service. The patient expressed understanding and agreed to proceed.    I provided 46 minutes of non-face-to-face time during this encounter.   Winton FORBES Rubinstein, LCSW                              THERAPIST PROGRESS NOTE     Session Time:  Monday 05/16/2024 8:10 AM  - 8:56 AM   Participation Level: Active  Behavioral Response: talkative/anxious,depressed  Type of Therapy: Individual Therapy  Treatment Goals addressed: Jorja will score less than 5 on the Generalized Anxiety Disorder 7 Scale (GAD-7)  Pt will practice relaxation techniques daily    Progress on Goals:  progressing   Interventions: CBT and Supportive  Summary: Virginia Trujillo is a 59 y.o. female who presents with symptoms of anxiety that have been lilfelong per patient's report but symptoms worsened when her mother died 15+ years ago. Also during the same 2 month period around her mother's death,her grandparents and her sister died. .Patient reports seeing a psyhiatrist once in early adolescence due to isolative behaviors and anxiety. She is seeing psychiatrist Dr. Okey. Patient reports no psychiatric hospitalizations. She has been seen intermittently by this clinician for several years.  Patient's reports still experiencing depression and anxiety is constantly worrying.  She is particularly stressed now about her youngest son who has legal issues.  She fears he may go to jail.  She also recently learned he is going  to have another baby.  She also has what if thoughts about various other issues especially about her father dying.     Patient last was seen about 2 weeks ago.  Patient reports increased stress and worry as her father continues to experience medical challenges but refuses to go to the hospital.  He is scheduled to see his doctor tomorrow and patient suspects he will be admitted to the hospital.  She also expresses worry about her 39 year old nephew who is an ICU due to recently having seizures.  Patient reports additional stress related to continued shoulder and arm pain and expresses frustration injections have not worked.  She reports having arthritis and states she does not think anything will help this.  She has become more aware of her triggers for arthritic flares.  She reports doing her crafts has been a good distracting activity but expresses frustration and reports increased anxiety when she is unable to complete projects by her self-imposed deadlines.  She reports pushing self as she has fears she will not be able to complete a project as she anticipates she will start to have increased pain and will not be able to return to the project.  Patient also reports enjoying doing her crafts as she is able to go to the flea market to sell her baskets, socialize with others, and pursue her interest.  Suicidal/Homicidal: None        Therapist Response: reviewed symptoms, discussed stressors, facilitated expression of thoughts and feelings, validated  feelings, assisted patient exam and thought patterns about pain and completing projects, assisted patient examine her past experience and completing a project if she stopped in the middle of the project to identify/challenge/and replace negative thoughts about completing projects, assisted patient identify realistic expectations of self, assisted patient identify ways to possibly segment her projects to try to pace self, developed plan with patient to implement  strategies discussed in session   Plan: Return again in 2 weeks  Diagnosis: Axis I: Generalized Anxiety Disorder      Axis II: No diagnosis Collaboration of Care: Psychiatrist AEB patient sees psychiatrist Dr. Okey in this practice for medication management  Patient/Guardian was advised Release of Information must be obtained prior to any record release in order to collaborate their care with an outside provider. Patient/Guardian was advised if they have not already done so to contact the registration department to sign all necessary forms in order for us  to release information regarding their care.   Consent: Patient/Guardian gives verbal consent for treatment and assignment of benefits for services provided during this visit. Patient/Guardian expressed understanding and agreed to proceed.    Winton FORBES Rubinstein, LCSW 05/16/2024    "

## 2024-05-30 ENCOUNTER — Ambulatory Visit (HOSPITAL_COMMUNITY): Admitting: Psychiatry

## 2024-05-30 DIAGNOSIS — F411 Generalized anxiety disorder: Secondary | ICD-10-CM

## 2024-05-30 NOTE — Progress Notes (Signed)
 "  Virtual Visit via Telephone Note  I connected with Virginia Trujillo on 05/30/24 at 8:05 AM EST by telephone and verified that I am speaking with the correct person using two identifiers.  Location: Patient: Home Provider: Home office   I discussed the limitations, risks, security and privacy concerns of performing an evaluation and management service by telephone and the availability of in person appointments. I also discussed with the patient that there may be a patient responsible charge related to this service. The patient expressed understanding and agreed to proceed.    I provided 37 minutes of non-face-to-face time during this encounter.   Winton FORBES Rubinstein, LCSW                              THERAPIST PROGRESS NOTE     Session Time:  Monday 05/30/2024 8:05 AM  -  8:42 AM   Participation Level: Active  Behavioral Response: talkative/anxious,  Type of Therapy: Individual Therapy  Treatment Goals addressed: [name] will score less than 5 on the Generalized Anxiety Disorder 7 Scale (GAD-7)  Pt will practice relaxation techniques daily    Progress on Goals:  progressing   Interventions: CBT and Supportive  Summary: Virginia Trujillo is a 59 y.o. female who presents with symptoms of anxiety that have been lilfelong per patient's report but symptoms worsened when her mother died 15+ years ago. Also during the same 2 month period around her mother's death,her grandparents and her sister died. .Patient reports seeing a psyhiatrist once in early adolescence due to isolative behaviors and anxiety. She is seeing psychiatrist Dr. Okey. Patient reports no psychiatric hospitalizations. She has been seen intermittently by this clinician for several years.  Patient's reports still experiencing depression and anxiety is constantly worrying.  She is particularly stressed now about her youngest son who has legal issues.  She fears he may go to jail.  She also recently learned he is going to have  another baby.  She also has what if thoughts about various other issues especially about her father dying.     Patient last was seen about 2 weeks ago.  Patient reports continued stress and worry regarding family. Per her report, her sister's boyfriend had a stroke and sister blames self for not being able to get help for him sooner.  He was in the ICU but now is in a regular room but patient states he is still not out of the woods.  This triggered patient's memories of her brother's death and thoughts of self blame.  Patient reports feeling a little down but talking with her family who assured her she did all she could for her brother.  Patient reports this was helpful.  She reports increased acceptance of the limits of her physical abilities when her shoulders are hurting.  She reports this has decreased some stress as she has tried to pace self more and have more realistic expectations.  She is continuing to use deep breathing and her spirituality as well as help from her support system to cope.     Suicidal/Homicidal: None        Therapist Response: reviewed symptoms, discussed stressors, facilitated expression of thoughts and feelings, validated feelings, assisted patient identify/challenge/and replace thoughts evoking inappropriate guilt with more helpful statements, praised and reinforced patient's increased acceptance of physical limitations at this time, assisted patient identified the effects of acceptance and having realistic expectations of self, discussed patient's  progress in treatment, praised and reinforced patient's use of healthy coping strategies, reviewed other strategies patient has used in the past, assisted patient identify ways to use strategies more proactively and consistently, discussed rationale for and reviewed instructions for practicing mindfulness activity (leaves on a stream), checked out interactive audio activity to patient and provided with access code, developed plan with  patient to utilize strategies discussed in session, discussed stepdown plan to include 1-2 more sessions, will send patient mental health maintenance plan in preparation for next session.  ent strategies discussed in session   Plan: Return again in 2 weeks  Diagnosis: Axis I: Generalized Anxiety Disorder      Axis II: No diagnosis Collaboration of Care: Psychiatrist AEB patient sees psychiatrist Dr. Okey in this practice for medication management  Patient/Guardian was advised Release of Information must be obtained prior to any record release in order to collaborate their care with an outside provider. Patient/Guardian was advised if they have not already done so to contact the registration department to sign all necessary forms in order for us  to release information regarding their care.   Consent: Patient/Guardian gives verbal consent for treatment and assignment of benefits for services provided during this visit. Patient/Guardian expressed understanding and agreed to proceed.    Winton FORBES Rubinstein, LCSW 05/30/2024    "

## 2024-06-13 ENCOUNTER — Ambulatory Visit (HOSPITAL_COMMUNITY): Admitting: Psychiatry

## 2024-06-27 ENCOUNTER — Ambulatory Visit (HOSPITAL_COMMUNITY): Admitting: Psychiatry

## 2024-07-11 ENCOUNTER — Ambulatory Visit (HOSPITAL_COMMUNITY): Admitting: Psychiatry

## 2024-07-25 ENCOUNTER — Ambulatory Visit (HOSPITAL_COMMUNITY): Admitting: Psychiatry

## 2024-08-01 ENCOUNTER — Telehealth (HOSPITAL_COMMUNITY): Admitting: Psychiatry
